# Patient Record
Sex: Male | Born: 1945 | Race: White | Hispanic: No | Marital: Married | Smoking: Never smoker
Health system: Southern US, Community
[De-identification: ages and names within clinical notes are randomized; demographics above are authoritative.]

## PROBLEM LIST (undated history)

## (undated) DIAGNOSIS — Z860100 Personal history of colon polyps, unspecified: Secondary | ICD-10-CM

## (undated) DIAGNOSIS — I10 Essential (primary) hypertension: Secondary | ICD-10-CM

## (undated) DIAGNOSIS — G250 Essential tremor: Secondary | ICD-10-CM

## (undated) DIAGNOSIS — K5730 Diverticulosis of large intestine without perforation or abscess without bleeding: Secondary | ICD-10-CM

## (undated) DIAGNOSIS — M1990 Unspecified osteoarthritis, unspecified site: Secondary | ICD-10-CM

## (undated) DIAGNOSIS — K5792 Diverticulitis of intestine, part unspecified, without perforation or abscess without bleeding: Secondary | ICD-10-CM

## (undated) DIAGNOSIS — Z8546 Personal history of malignant neoplasm of prostate: Secondary | ICD-10-CM

## (undated) DIAGNOSIS — M67262 Synovial hypertrophy, not elsewhere classified, left lower leg: Secondary | ICD-10-CM

## (undated) DIAGNOSIS — K219 Gastro-esophageal reflux disease without esophagitis: Secondary | ICD-10-CM

## (undated) DIAGNOSIS — F419 Anxiety disorder, unspecified: Secondary | ICD-10-CM

## (undated) DIAGNOSIS — E785 Hyperlipidemia, unspecified: Secondary | ICD-10-CM

## (undated) DIAGNOSIS — T4145XA Adverse effect of unspecified anesthetic, initial encounter: Secondary | ICD-10-CM

## (undated) DIAGNOSIS — R351 Nocturia: Secondary | ICD-10-CM

## (undated) DIAGNOSIS — Z87442 Personal history of urinary calculi: Secondary | ICD-10-CM

## (undated) DIAGNOSIS — N3941 Urge incontinence: Secondary | ICD-10-CM

## (undated) DIAGNOSIS — G4733 Obstructive sleep apnea (adult) (pediatric): Secondary | ICD-10-CM

## (undated) DIAGNOSIS — Z87438 Personal history of other diseases of male genital organs: Secondary | ICD-10-CM

## (undated) DIAGNOSIS — C801 Malignant (primary) neoplasm, unspecified: Secondary | ICD-10-CM

## (undated) HISTORY — PX: DIAGNOSTIC LAPAROSCOPY: SUR761

## (undated) HISTORY — PX: HERNIA REPAIR: SHX51

## (undated) HISTORY — PX: BACK SURGERY: SHX140

## (undated) HISTORY — PX: JOINT REPLACEMENT: SHX530

## (undated) HISTORY — PX: KNEE ARTHROSCOPY: SUR90

## (undated) HISTORY — DX: Essential (primary) hypertension: I10

## (undated) HISTORY — DX: Anxiety disorder, unspecified: F41.9

## (undated) HISTORY — DX: Unspecified osteoarthritis, unspecified site: M19.90

## (undated) HISTORY — DX: Malignant (primary) neoplasm, unspecified: C80.1

## (undated) HISTORY — PX: TONSILLECTOMY: SUR1361

## (undated) HISTORY — DX: Hyperlipidemia, unspecified: E78.5

---

## 1997-09-17 DIAGNOSIS — T8859XA Other complications of anesthesia, initial encounter: Secondary | ICD-10-CM

## 1997-09-17 HISTORY — DX: Other complications of anesthesia, initial encounter: T88.59XA

## 2002-03-25 ENCOUNTER — Encounter: Admission: RE | Admit: 2002-03-25 | Discharge: 2002-03-25 | Payer: Self-pay | Admitting: Neurosurgery

## 2002-03-25 ENCOUNTER — Encounter: Payer: Self-pay | Admitting: Neurosurgery

## 2002-04-07 ENCOUNTER — Encounter: Admission: RE | Admit: 2002-04-07 | Discharge: 2002-04-07 | Payer: Self-pay | Admitting: Neurosurgery

## 2002-04-07 ENCOUNTER — Encounter: Payer: Self-pay | Admitting: Neurosurgery

## 2002-04-10 ENCOUNTER — Encounter: Payer: Self-pay | Admitting: Neurosurgery

## 2002-04-13 ENCOUNTER — Encounter: Payer: Self-pay | Admitting: Physical Medicine and Rehabilitation

## 2002-04-13 ENCOUNTER — Encounter: Payer: Self-pay | Admitting: Neurosurgery

## 2002-04-13 ENCOUNTER — Inpatient Hospital Stay (HOSPITAL_COMMUNITY): Admission: RE | Admit: 2002-04-13 | Discharge: 2002-04-14 | Payer: Self-pay | Admitting: Neurosurgery

## 2002-04-13 HISTORY — PX: LUMBAR DISC SURGERY: SHX700

## 2007-04-29 ENCOUNTER — Ambulatory Visit (HOSPITAL_COMMUNITY): Admission: RE | Admit: 2007-04-29 | Discharge: 2007-04-29 | Payer: Self-pay | Admitting: Urology

## 2007-05-12 ENCOUNTER — Ambulatory Visit: Admission: RE | Admit: 2007-05-12 | Discharge: 2007-06-17 | Payer: Self-pay | Admitting: Radiation Oncology

## 2007-06-30 DIAGNOSIS — C4492 Squamous cell carcinoma of skin, unspecified: Secondary | ICD-10-CM

## 2007-06-30 HISTORY — DX: Squamous cell carcinoma of skin, unspecified: C44.92

## 2007-07-17 ENCOUNTER — Encounter (INDEPENDENT_AMBULATORY_CARE_PROVIDER_SITE_OTHER): Payer: Self-pay | Admitting: Urology

## 2007-07-17 ENCOUNTER — Inpatient Hospital Stay (HOSPITAL_COMMUNITY): Admission: RE | Admit: 2007-07-17 | Discharge: 2007-07-18 | Payer: Self-pay | Admitting: Urology

## 2007-07-17 HISTORY — PX: ROBOT ASSISTED LAPAROSCOPIC RADICAL PROSTATECTOMY: SHX5141

## 2008-03-08 ENCOUNTER — Inpatient Hospital Stay (HOSPITAL_COMMUNITY): Admission: RE | Admit: 2008-03-08 | Discharge: 2008-03-11 | Payer: Self-pay | Admitting: Orthopedic Surgery

## 2008-03-08 HISTORY — PX: TOTAL KNEE ARTHROPLASTY: SHX125

## 2009-08-10 ENCOUNTER — Ambulatory Visit (HOSPITAL_BASED_OUTPATIENT_CLINIC_OR_DEPARTMENT_OTHER): Admission: RE | Admit: 2009-08-10 | Discharge: 2009-08-10 | Payer: Self-pay | Admitting: Orthopedic Surgery

## 2010-02-24 ENCOUNTER — Encounter: Payer: Self-pay | Admitting: Family Medicine

## 2010-03-03 ENCOUNTER — Ambulatory Visit: Payer: Self-pay | Admitting: Family Medicine

## 2010-03-03 DIAGNOSIS — I10 Essential (primary) hypertension: Secondary | ICD-10-CM

## 2010-03-03 DIAGNOSIS — Z8546 Personal history of malignant neoplasm of prostate: Secondary | ICD-10-CM

## 2010-03-03 DIAGNOSIS — Z87442 Personal history of urinary calculi: Secondary | ICD-10-CM

## 2010-03-03 DIAGNOSIS — E785 Hyperlipidemia, unspecified: Secondary | ICD-10-CM

## 2010-07-13 ENCOUNTER — Ambulatory Visit: Payer: Self-pay | Admitting: Family Medicine

## 2010-07-13 LAB — CONVERTED CEMR LAB
ALT: 17 units/L (ref 0–53)
AST: 22 units/L (ref 0–37)
BUN: 14 mg/dL (ref 6–23)
Basophils Relative: 1 % (ref 0.0–3.0)
Chloride: 100 meq/L (ref 96–112)
Direct LDL: 155.1 mg/dL
Eosinophils Relative: 3.4 % (ref 0.0–5.0)
GFR calc non Af Amer: 88.21 mL/min (ref >60)
Glucose, Urine, Semiquant: NEGATIVE
HCT: 43.2 % (ref 39.0–52.0)
HDL: 35.3 mg/dL — ABNORMAL LOW (ref >39.00)
Hemoglobin: 14.9 g/dL (ref 13.0–17.0)
Lymphs Abs: 1.8 10*3/uL (ref 0.7–4.0)
MCV: 93.9 fL (ref 78.0–100.0)
Monocytes Absolute: 0.4 10*3/uL (ref 0.1–1.0)
Monocytes Relative: 6.8 % (ref 3.0–12.0)
Neutro Abs: 4 10*3/uL (ref 1.4–7.7)
Nitrite: NEGATIVE
Potassium: 4.2 meq/L (ref 3.5–5.1)
Protein, U semiquant: NEGATIVE
RBC: 4.61 M/uL (ref 4.22–5.81)
Sodium: 134 meq/L — ABNORMAL LOW (ref 135–145)
TSH: 1.98 microintl units/mL (ref 0.35–5.50)
Total Bilirubin: 0.8 mg/dL (ref 0.3–1.2)
Total CHOL/HDL Ratio: 7
Total Protein: 7.2 g/dL (ref 6.0–8.3)
VLDL: 48.6 mg/dL — ABNORMAL HIGH (ref 0.0–40.0)
WBC Urine, dipstick: NEGATIVE
WBC: 6.5 10*3/uL (ref 4.5–10.5)
pH: 5.5

## 2010-07-20 ENCOUNTER — Encounter: Payer: Self-pay | Admitting: Family Medicine

## 2010-07-20 ENCOUNTER — Ambulatory Visit: Payer: Self-pay | Admitting: Family Medicine

## 2010-07-20 DIAGNOSIS — G250 Essential tremor: Secondary | ICD-10-CM | POA: Insufficient documentation

## 2010-08-16 ENCOUNTER — Encounter: Payer: Self-pay | Admitting: Family Medicine

## 2010-08-16 ENCOUNTER — Ambulatory Visit (HOSPITAL_BASED_OUTPATIENT_CLINIC_OR_DEPARTMENT_OTHER)
Admission: RE | Admit: 2010-08-16 | Discharge: 2010-08-16 | Payer: Self-pay | Source: Home / Self Care | Admitting: Family Medicine

## 2010-08-30 ENCOUNTER — Telehealth: Payer: Self-pay | Admitting: Family Medicine

## 2010-10-10 ENCOUNTER — Other Ambulatory Visit: Payer: Self-pay | Admitting: Family Medicine

## 2010-10-10 ENCOUNTER — Ambulatory Visit
Admission: RE | Admit: 2010-10-10 | Discharge: 2010-10-10 | Payer: Self-pay | Source: Home / Self Care | Attending: Family Medicine | Admitting: Family Medicine

## 2010-10-10 LAB — LIPID PANEL
Cholesterol: 157 mg/dL (ref 0–200)
HDL: 34.4 mg/dL — ABNORMAL LOW (ref >39.00)
Total CHOL/HDL Ratio: 5
Triglycerides: 210 mg/dL — ABNORMAL HIGH (ref 0.0–149.0)
VLDL: 42 mg/dL — ABNORMAL HIGH (ref 0.0–40.0)

## 2010-10-10 LAB — HEPATIC FUNCTION PANEL
ALT: 21 U/L (ref 0–53)
AST: 24 U/L (ref 0–37)
Albumin: 4 g/dL (ref 3.5–5.2)
Alkaline Phosphatase: 74 U/L (ref 39–117)
Bilirubin, Direct: 0.1 mg/dL (ref 0.0–0.3)
Total Bilirubin: 0.5 mg/dL (ref 0.3–1.2)
Total Protein: 7.1 g/dL (ref 6.0–8.3)

## 2010-10-17 ENCOUNTER — Other Ambulatory Visit: Payer: Self-pay | Admitting: Family Medicine

## 2010-10-17 ENCOUNTER — Ambulatory Visit
Admission: RE | Admit: 2010-10-17 | Discharge: 2010-10-17 | Payer: Self-pay | Source: Home / Self Care | Attending: Family Medicine | Admitting: Family Medicine

## 2010-10-17 DIAGNOSIS — G4700 Insomnia, unspecified: Secondary | ICD-10-CM | POA: Insufficient documentation

## 2010-10-17 DIAGNOSIS — R5381 Other malaise: Secondary | ICD-10-CM

## 2010-10-17 DIAGNOSIS — R5383 Other fatigue: Secondary | ICD-10-CM | POA: Insufficient documentation

## 2010-10-17 LAB — TESTOSTERONE: Testosterone: 356.18 ng/dL (ref 350.00–890.00)

## 2010-10-17 NOTE — Assessment & Plan Note (Signed)
Summary: to be est/pt will fasting/njr pt rsc/njr   Vital Signs:  Patient profile:   65 year old male Height:      70.25 inches Weight:      194 pounds BMI:     27.74 Temp:     98.0 degrees F oral Pulse rate:   80 / minute Pulse rhythm:   regular Resp:     12 per minute BP sitting:   142 / 92  (left arm) Cuff size:   regular  Vitals Entered By: Sid Falcon LPN (March 03, 2010 10:41 AM)  History of Present Illness: New patient establish care. Past medical history reviewed. 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 He had prostate surgery robotically 2008. Has also had some knee surgery and lumbosacral disc surgery several years ago. Current medications reviewed.  On Losartan 100mg  daily for hypertension.  Family history specific for father dying aortic aneurysm 52. Mother died age 12 of pancreatitis. No siblings. Father had prostate cancer.  Patient is married. Retail banker. Nonsmoker. Occasional alcohol use  Hypertension History:      He denies headache, chest pain, palpitations, dyspnea with exertion, orthopnea, PND, peripheral edema, visual symptoms, neurologic problems, syncope, and side effects from treatment.        Positive major cardiovascular risk factors include male age 84 years old or older, hyperlipidemia, and hypertension.  Negative major cardiovascular risk factors include non-tobacco-user status.     Preventive Screening-Counseling & Management  Alcohol-Tobacco     Smoking Status: never  Allergies (verified): No Known Drug Allergies  Past History:  Family History: Last updated: 03/03/2010 father aortic aneurism age 19 grandfather, heart attack age 37 mother sudden death age 49  Pancreatitis complications.  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  Social History: Occupation:  Retail banker Married Never Smoked Alcohol use-yes Occupation:  employed Smoking Status:  never  Review of Systems  The patient denies anorexia, fever, chest pain, syncope, dyspnea on exertion, peripheral edema, prolonged cough, headaches, hemoptysis, abdominal pain, melena, hematochezia, severe indigestion/heartburn, hematuria, incontinence, and muscle weakness.         losing some weight last month due to his efforts.  Physical Exam  General:  Well-developed,well-nourished,in no acute distress; alert,appropriate and cooperative throughout examination Head:  Normocephalic and atraumatic without obvious abnormalities. No apparent alopecia or balding. Eyes:  pupils equal, pupils round, and pupils reactive to light.   Mouth:  Oral mucosa and oropharynx without lesions or exudates.  Teeth in good repair. Neck:  No deformities, masses, or tenderness noted. Lungs:  Normal respiratory effort, chest expands symmetrically. Lungs are clear to auscultation, no crackles or wheezes. Heart:  normal rate and regular rhythm.   Abdomen:  soft, non-tender, no distention, and no masses.   Extremities:  No clubbing, cyanosis, edema, or deformity noted with normal full range of motion of all joints.  Psych:  normally interactive, good eye contact, not anxious appearing, and not depressed  appearing.     Impression & Recommendations:  Problem # 1:  HYPERLIPIDEMIA (ICD-272.4) pt working on weight loss.  Consider CPE in 4 months.  Problem # 2:  ESSENTIAL HYPERTENSION (ICD-401.9)  His updated medication list for this problem includes:    Losartan Potassium 100 Mg Tabs (Losartan potassium) ..... Once daily  Problem # 3:  ADENOCARCINOMA, PROSTATE, HX OF (ICD-V10.46) followed by urology.  Problem # 4:  NEPHROLITHIASIS, HX OF (ICD-V13.01)  Complete Medication List: 1)  Sertraline Hcl 50 Mg Tabs (Sertraline hcl) .... Once daily 2)  Losartan Potassium 100 Mg Tabs (Losartan potassium) .... Once daily 3)  Aspirin 81 Mg Tabs (Aspirin) .... Once daily 4)  Vitamin D3 2000 Unit Caps (Cholecalciferol) .... Once daily 5)  Glucosamine Chondr 1500 Complx Caps (Glucosamine-chondroit-vit c-mn) .... 2 pills once daily  Hypertension Assessment/Plan:      The patient's hypertensive risk group is category B: At least one risk factor (excluding diabetes) with no target organ damage.  Today's blood pressure is 142/92.    Patient Instructions: 1)  Consider scheduling complete physical examination in 3-4 months 2)  It is important that you exercise reguarly at least 20 minutes 5 times a week. If you develop chest pain, have severe difficulty breathing, or feel very tired, stop exercising immediately and seek medical attention.  3)  You need to lose weight. Consider a lower calorie diet and regular exercise.  Prescriptions: LOSARTAN POTASSIUM 100 MG TABS (LOSARTAN POTASSIUM) once daily  #90 x 3   Entered and Authorized by:   Evelena Peat MD   Signed by:   Evelena Peat MD on 03/03/2010   Method used:   Faxed to ...       [facility] (retail)       [phone]       [place], [place]  [address]       Ph: [phone]       Fax: [phone]   RxID:   [id number] SERTRALINE HCL 50 MG TABS (SERTRALINE HCL) once daily  #90 x 3   Entered and Authorized by:   Evelena Peat MD   Signed by:   Evelena Peat MD on 03/03/2010   Method used:   Faxed to ...       [facility] (retail)       [phone] [address]       [place], [place]  [address]       Ph: [phone]       Fax: [phone]   RxID:   [id number]   Preventive Care Screening  Colonoscopy:    Date:  02/15/2005    Results:  normal   Last Tetanus Booster:    Date:  10/18/2004    Results:  Historical     Immunization History:  Pneumovax Immunization History:    Pneumovax:  historical (02/16/2007)  Zostavax History:    Zostavax # 1:  zostavax (02/16/2007)    Preventive Care Screening  Colonoscopy:    Date:  02/15/2005    Results:  normal   Last Tetanus Booster:    Date:  10/18/2004    Results:  Historical

## 2010-10-17 NOTE — Letter (Signed)
Summary: Records from [facility] 2008 - 2011  Records from [facility] 2008 - 2011   Imported By: Maryln Gottron 03/30/2010 13:13:43  _____________________________________________________________________  External Attachment:    Type:   Image     Comment:   External Document

## 2010-10-17 NOTE — Assessment & Plan Note (Signed)
Summary: cpx//ccm   Vital Signs:  Patient profile:   65 year old male Height:      70.25 inches Weight:      203 pounds Temp:     98.9 degrees F oral Pulse rate:   80 / minute Pulse rhythm:   regular Resp:     12 per minute BP sitting:   128 / 86  (left arm) Cuff size:   large  Vitals Entered By: Sid Falcon LPN (July 20, 2010 9:04 AM)  History of Present Illness: Here for CPE.  PMH, SH, AND FH  reviewed. Also here to evaluate the following separate problems:  ? OSA.  Loud snoring.  Pt wakes up gasping for air on occasion. Daytime somnolence.  Hx deviated septum.  ?observed episodes of apnea per spouse.  Some hand tremor.  Worse with fine motor manipulation.  Not progressing. No other concerning symptoms.  Rare ETOH use and does not see improvement with ETOH. No rigidity or slowing of movements.  No hx of familial tremor.  Occ sensation of skipped heart beat at rest .  Never with activity.  Very transient.  Not related to  activity.  No syncope.  No chest pain.  Stress test 2 year ago normal.  Clinical Review Panels:  Prevention   Last Colonoscopy:  normal (02/15/2005)  Immunizations   Last Tetanus Booster:  Historical (10/18/2004)   Last Pneumovax:  Historical (02/16/2007)   Last Zoster Vaccine:  Zostavax (02/16/2007)  Lipid Management   Cholesterol:  239 (07/13/2010)   HDL (good cholesterol):  35.30 (07/13/2010)  Diabetes Management   Creatinine:  0.9 (07/13/2010)   Last Pneumovax:  Historical (02/16/2007)  CBC   WBC:  6.5 (07/13/2010)   RBC:  4.61 (07/13/2010)   Hgb:  14.9 (07/13/2010)   Hct:  43.2 (07/13/2010)   Platelets:  282.0 (07/13/2010)   MCV  93.9 (07/13/2010)   MCHC  34.4 (07/13/2010)   RDW  13.6 (07/13/2010)   PMN:  60.7 (07/13/2010)   Lymphs:  28.1 (07/13/2010)   Monos:  6.8 (07/13/2010)   Eosinophils:  3.4 (07/13/2010)   Basophil:  1.0 (07/13/2010)  Complete Metabolic Panel   Glucose:  106 (07/13/2010)   Sodium:  134  (07/13/2010)   Potassium:  4.2 (07/13/2010)   Chloride:  100 (07/13/2010)   CO2:  28 (07/13/2010)   BUN:  14 (07/13/2010)   Creatinine:  0.9 (07/13/2010)   Albumin:  3.9 (07/13/2010)   Total Protein:  7.2 (07/13/2010)   Calcium:  9.0 (07/13/2010)   Total Bili:  0.8 (07/13/2010)   Alk Phos:  74 (07/13/2010)   SGPT (ALT):  17 (07/13/2010)   SGOT (AST):  22 (07/13/2010)   Allergies (verified): No Known Drug Allergies  Past History:  Past Medical History: Last updated: 03/03/2010 Arthritis Hyperlipidemia Hypertension kidney stones 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 Smoked Alcohol use-yes  Risk Factors: Exercise: no (03/03/2010)  Risk Factors: Smoking Status: never (03/03/2010) PMH-FH-SH reviewed for relevance  Review of Systems  The patient denies anorexia, fever, weight loss, weight gain, vision loss, decreased hearing, hoarseness, chest pain, syncope, dyspnea  on exertion, peripheral edema, prolonged cough, headaches, hemoptysis, abdominal pain, melena, hematochezia, severe indigestion/heartburn, hematuria, incontinence, genital sores, muscle weakness, suspicious skin lesions, transient blindness, difficulty walking, depression, unusual weight change, abnormal bleeding, enlarged lymph nodes, and testicular masses.     Physical Exam  General:  Well-developed,well-nourished,in no acute distress; alert,appropriate and cooperative throughout examination Head:  Normocephalic and atraumatic without obvious  abnormalities. No apparent alopecia or balding. Eyes:  No corneal or conjunctival inflammation noted. EOMI. Perrla. Funduscopic exam benign, without hemorrhages, exudates or papilledema. Vision grossly normal. Ears:  External ear exam shows no significant lesions or deformities.  Otoscopic examination reveals clear canals, tympanic membranes are intact bilaterally without bulging, retraction, inflammation or discharge. Hearing is grossly normal bilaterally. Mouth:  Oral mucosa and oropharynx without lesions or exudates.  Teeth in good repair. Neck:  No deformities, masses, or tenderness noted. Chest Wall:  No deformities, masses, tenderness or gynecomastia noted. Lungs:  Normal respiratory effort, chest expands symmetrically. Lungs are clear to auscultation, no crackles or wheezes. Heart:  Normal rate and regular rhythm. S1 and S2 normal without gallop, murmur, click, rub or other extra sounds. Abdomen:  Bowel sounds positive,abdomen soft and non-tender without masses, organomegaly or hernias noted. Prostate:  per urology Msk:  No deformity or scoliosis noted of thoracic or lumbar spine.   Extremities:  No clubbing, cyanosis, edema, or deformity noted with normal full range of motion of all joints.   Neurologic:  alert & oriented X3, cranial nerves II-XII intact, and strength normal in all extremities.   Skin:  no rashes and no suspicious lesions.   Cervical Nodes:  No lymphadenopathy noted Psych:  Cognition and judgment appear intact. Alert and cooperative with normal attention span and concentration. No apparent delusions, illusions, hallucinations   Impression & Recommendations:  Problem # 1:  ROUTINE GENERAL MEDICAL EXAM@HEALTH  CARE FACL (ICD-V70.0)  Orders: EKG w/ Interpretation (93000) Sleep Disorder Referral (Sleep Disorder)  Problem # 2:  PALPITATIONS (ICD-785.1) EKG normal.  ?PVCs .  Consider holter if progresses.  Problem # 3:  TREMOR, ESSENTIAL (ICD-333.1) Supsect essential  tremor.  Discussed options.  At this point, not interested in medical rx options unless progresses.  Problem # 4:  HYPERLIPIDEMIA (ICD-272.4) discussed pros and cons of therapy.  We have decided to start statin wtih f/u repeat 6-8 weeks. His updated medication list for this problem includes:    Simvastatin 20 Mg Tabs (Simvastatin) ..... One by mouth at bedtime  Problem # 5:  APNEA (ICD-786.03) ?OSA with hx snoring, waking with gasping for air, daytime somnolence. Set up sleep study.  Complete Medication List: 1)  Sertraline Hcl 50 Mg Tabs (Sertraline hcl) .... Once daily 2)  Losartan Potassium 100 Mg Tabs (Losartan potassium) .... Once daily 3)  Aspirin 81 Mg Tabs (Aspirin) .... Once daily 4)  Vitamin D3 2000 Unit Caps (Cholecalciferol) .... Once daily 5)  Glucosamine Chondr 1500 Complx Caps (Glucosamine-chondroit-vit c-mn) .... 2 pills once daily 6)  Simvastatin 20 Mg Tabs (Simvastatin) .... One by mouth at bedtime  Patient Instructions: 1)  Please schedule a follow-up appointment in 2 months.  2)  Hepatic Panel prior to visit ICD-9: 272.4 3)  Lipid panel prior to visit ICD-9 : 272.4 Prescriptions: SIMVASTATIN 20 MG TABS (SIMVASTATIN) one by mouth at bedtime  #30 x 5   Entered and Authorized by:   Evelena Peat MD   Signed by:   Evelena Peat MD on 07/20/2010   Method used:   Faxed to [facility] (retail)       [phone] [address]       [place], [place]  [address]       Ph: [phone]       Fax: [phone]   RxID:   [id number]    Orders Added: 1)  Est. Patient 40-64 years [99396] 2)  EKG w/ Interpretation [93000] 3)  Sleep Disorder Referral [Sleep Disorder] 4)  Est. Patient Level IV [27253]

## 2010-10-19 NOTE — Progress Notes (Signed)
  Phone Note Call from Patient Call back at [facility] Phone [phone] Call back at Work Phone [phone]   Caller: Patient Call For: Evelena Peat MD Summary of Call: Needs sleeep study reports. Initial call taken by: [facility] CMA AAMA,  August 30, 2010 3:49 PM  Follow-up for Phone Call        Pt notified of sleep results.  Mild apnea with increased periodic limb movements.  He is undecided regarding whether to do dedicted CPAP titration study and does not wish to take any additional meds at this time.  He has only mild daytime sleepiness.  Will discuss further at visit in January,. Follow-up by: Evelena Peat MD,  August 30, 2010 6:18 PM

## 2010-10-25 NOTE — Assessment & Plan Note (Signed)
Summary: 2 MONTH ROV/NJR   Vital Signs:  Patient profile:   65 year old male Weight:      205 pounds Temp:     98.3 degrees F oral BP sitting:   140 / 90  (left arm) Cuff size:   large  Vitals Entered By: Sid Falcon LPN (October 17, 2010 8:49 AM) CC: 2 month follow-up   History of Present Illness: Here for follow up :  Hyperlipidemia.   started treatment with simvastatin and he has had no side effects. Recent lipids reviewed with patient and good improvement.    hypertension treated with losartan. Blood pressure stable. recent mild weight gain.  History of some chronic fatigue. Patient clearly is hypo-testosterone is him. Never checked. chronic poor sleep. Recent sleep study reviewed with patient. Mild obstructive sleep apnea. Increase in periodic leg movements. Question restless leg syndrome. Patient has history of frequent wakening.  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 Smoked Alcohol use-yes  Risk Factors: Exercise: no (03/03/2010)  Risk Factors: Smoking Status: never (03/03/2010) PMH-FH-SH reviewed for relevance  Review of Systems  The patient denies anorexia, fever, weight loss, vision loss, decreased hearing, hoarseness, chest pain, syncope, dyspnea on exertion, peripheral edema, prolonged cough, headaches, hemoptysis, abdominal  pain, melena, hematochezia, severe indigestion/heartburn, hematuria, incontinence, muscle weakness, suspicious skin lesions, transient blindness, difficulty walking, depression, unusual weight change, enlarged lymph nodes, and testicular masses.    Physical Exam  General:  Well-developed,well-nourished,in no acute distress; alert,appropriate and cooperative throughout examination Mouth:  Oral mucosa and oropharynx without lesions or exudates.  Teeth in good repair. Lungs:  Normal respiratory effort, chest expands symmetrically. Lungs are clear to auscultation, no crackles or wheezes. Heart:  Normal rate and regular rhythm. S1 and S2 normal without gallop, murmur, click, rub or other extra sounds. Extremities:  No clubbing, cyanosis, edema, or deformity noted with normal full range of motion of all joints.     Impression & Recommendations:  Problem # 1:  FATIGUE (ICD-780.79)  likely multifactorial. Only mild obstructive apnea. Rule out low testosterone. Recent TSH normal. Recommend weight loss and more regular exercise. possibly related to poor sleep quality Orders: Venipuncture (04540) Specimen Handling (98119) TLB-Testosterone, Total (84403-TESTO)  Problem # 2:  HYPERLIPIDEMIA (ICD-272.4) Assessment: Improved  His updated medication list for this problem includes:    Simvastatin 20 Mg Tabs (Simvastatin) ..... One by mouth at bedtime  Problem # 3:  INSOMNIA, CHRONIC (ICD-307.42)  trial of low-dose clonazepam 0.5 mg each bedtime which may help with sleep as well as periodic leg movements. Restriction of caffeine at night and afternoons  Complete Medication List: 1)  Sertraline Hcl 50 Mg Tabs (Sertraline hcl) .... Once daily 2)  Losartan Potassium 100 Mg Tabs (Losartan potassium) .... Once daily 3)  Aspirin 81 Mg Tabs (Aspirin) .... Once daily 4)  Vitamin D3 2000 Unit Caps (Cholecalciferol) .... Once daily 5)  Simvastatin 20 Mg Tabs (Simvastatin) .... One by mouth at bedtime 6)   Clonazepam 0.5 Mg Tabs (  Clonazepam) .... One by mouth q hs  Patient Instructions: 1)  Please schedule a follow-up appointment in 3 months .  Prescriptions: CLONAZEPAM 0.5 MG TABS (CLONAZEPAM) one by mouth q hs  #30 x 3   Entered and Authorized by:   Evelena Peat MD   Signed by:   Evelena Peat MD on 10/17/2010   Method used:   Print then Give to Patient   RxID:   [id number]    Orders Added: 1)  Venipuncture [56213] 2)  Specimen Handling [99000] 3)  TLB-Testosterone, Total [84403-TESTO] 4)  Est. Patient Level IV [08657]

## 2010-12-20 LAB — POCT I-STAT 4, (NA,K, GLUC, HGB,HCT)
Hemoglobin: 15 g/dL (ref 13.0–17.0)
Potassium: 4.3 mEq/L (ref 3.5–5.1)

## 2011-01-16 ENCOUNTER — Ambulatory Visit: Payer: Self-pay | Admitting: Family Medicine

## 2011-01-17 ENCOUNTER — Other Ambulatory Visit: Payer: Self-pay | Admitting: Family Medicine

## 2011-01-30 NOTE — H&P (Signed)
Micheal [name], Micheal [name]              ACCOUNT NO.:  [id number]   MEDICAL RECORD NO.:  [id number]          PATIENT TYPE:  INP   LOCATION:  0011                         FACILITY:  [facility]   PHYSICIAN:  Ollen Gross, M.D.    DATE OF BIRTH:  [birth year]   DATE OF ADMISSION:  03/08/2008  DATE OF DISCHARGE:                              HISTORY & PHYSICAL   CHIEF COMPLAINT:  Left knee pain.   HISTORY OF PRESENT ILLNESS:  The patient is a 65 year old male who has  been seen by Dr. Lequita Halt for ongoing left knee pain.  He has known end-  stage arthritis.  He has been treated conservatively in the past but has  worsening pain and dysfunction.  He is at a point now where he would  like to have something permanently done.  He has been treated with  cortisone injection in the past which only provided temporary relief.  He is felt to be a good candidate.  The risks and benefits discussed.  The patient is subsequently admitted to the hospital.  He has been seen  preoperatively by Pam Drown, M.D. and felt to be stable for  surgery.   ALLERGIES:  NO KNOWN DRUG ALLERGIES.   CURRENT MEDICATIONS:  1. Lipitor.  2. Benicar.  3. Sertraline.  4. Alprazolam.  5. Aspirin.   PAST MEDICAL HISTORY:  1. Anxiety.  2. Hypertension.  3. History of prostate cancer.  4. Renal calculi.  5. Degenerative disk disease.   PAST SURGICAL HISTORY:  Disk surgery in 2003, prostate surgery November  2007.   SOCIAL HISTORY:  Married.  Business owner.  Nonsmoker.  About 2 to 3  glasses wine per week.  Two children.  Family will be assisting with  care after surgery.  Has about 2 to 3 steps entering his home.   FAMILY HISTORY:  Father deceased age 58 with heart failure.  Mother  deceased age 73 with pancreatic disease.   REVIEW OF SYSTEMS:  GENERAL:  No fevers, chills, night sweats.  NEURO:  No seizures, syncope or paralysis.  RESPIRATORY:  No shortness breath at rest, productive cough or  hemoptysis.  CARDIOVASCULAR:  No chest pain, no orthopnea.  GI:  No nausea, vomiting, diarrhea or constipation.  GU:  No dysuria, hematuria or discharge.  MUSCULOSKELETAL: Left knee pain.   PHYSICAL EXAMINATION:  VITAL SIGNS: Pulse 68, respirations 12, blood  pressure 124/86.  GENERAL: 65 year old white male well-nourished, well-developed, no acute  distress.  He is alert, oriented and cooperative.  HEENT: Normocephalic,  atraumatic.  Pupils are round and reactive.  Oropharynx clear.  EOMs  intact.  NECK:  Supple.  CHEST: Clear.  HEART: Regular rate and rhythm.  No murmur.  ABDOMEN:  Soft, slightly round.  Bowel sounds.  RECTAL/GENITALIA: Not done, not pertinent to present illness.  EXTREMITIES:  Left knee no effusion.  Range of motion 5-120, marked  crepitus, tender more medial than lateral, slight varus malalignment  deformity.   IMPRESSION:  Osteoarthritis left knee.   PLAN:  The patient admitted tok [facility] to  undergo a left total knee  replacement arthroplasty.  Surgery will be  performed by Dr. Ollen Gross.      Alexzandrew L. Perkins, P.A.C.      Ollen Gross, M.D.  Electronically Signed    ALP/MEDQ  D:  03/08/2008  T:  03/08/2008  Job:  [id number]   cc:   Ollen Gross, M.D.  Fax: [phone]   Pam Drown, M.D.  Fax: [phone]

## 2011-01-30 NOTE — Op Note (Signed)
Micheal [name], Micheal [name]              ACCOUNT NO.:  [id number]   MEDICAL RECORD NO.:  [id number]          PATIENT TYPE:  INP   LOCATION:  1444                         FACILITY:  [facility]   PHYSICIAN:  Heloise Purpura, MD      DATE OF BIRTH:  [birth year]   DATE OF PROCEDURE:  07/17/2007  DATE OF DISCHARGE:                               OPERATIVE REPORT   PREOPERATIVE DIAGNOSIS:  Clinically localized adenocarcinoma of  prostate.   POSTOPERATIVE DIAGNOSIS:  Clinically localized adenocarcinoma of  prostate.   PROCEDURE:  Robotic assisted laparoscopic radical prostatectomy  (bilateral nerve sparing).   SURGEON:  Heloise Purpura, M.D.   ASSISTANT:  Excell Seltzer. Annabell Howells, M.D.   ANESTHESIA:  General.   COMPLICATIONS:  None.   ESTIMATED BLOOD LOSS:  100 mL.   SPECIMENS:  Prostate and seminal vesicles.   DISPOSITION OF SPECIMENS:  To pathology.   DRAINS:  1. 20 French straight catheter.  2. #19 Blake pelvic drain.   INDICATIONS:  Mr. Klahn is a 65 year old gentleman with recently  diagnosed clinically localized prostate cancer.  After a discussion  regarding management options, the patient elected to proceed with the  above procedure.  The potential risks, complications, and alternative  options were discussed in detail and informed consent was obtained.   DESCRIPTION OF PROCEDURE:  The patient was taken to the operating room  and a general anesthetic was administered.  He was given preoperative  antibiotics, placed in the dorsal lithotomy position, prepped and draped  in the usual sterile fashion.  Next, a preoperative time out was  performed.  A Foley catheter was inserted into the bladder.  A site was  selected just to the left of the umbilicus and entry into the peritoneal  cavity was obtained using a standard open Hassan technique.  This  allowed entry under direct vision without difficulty and a 12 mm port  was then placed.  A pneumoperitoneum was established and the 0 degrees  lens  was used to inspect the abdomen.  There was no evidence of any  intra-abdominal injuries or other abnormalities.  Bilateral 8 mm robotic  ports were then placed on either side of the camera port and just below  the level of the umbilicus.  An additional 8 mm robotic port was placed  in the far left lateral abdominal wall.  A 5 mm port was placed between  the camera port and the right robotic port.  An additional 12 mm port  was placed in the far right lateral abdominal wall for laparoscopic  assistance.  All ports were placed under direct vision without  difficulty.   The surgical cart was then docked.  With the aid of cautery scissors,  the bladder was reflected posteriorly allowing entry into the space of  Retzius and identification of the endopelvic fascia and prostate.  The  endopelvic fascia was incised from the apex back to the base of the  prostate bilaterally and the underlying levator muscle fibers were swept  laterally off the prostate, thereby isolating the dorsal venous complex.  The dorsal venous complex was then stapled and  divided with a 45 mm flex  ETS stapler.  The bladder neck was then identified with the aid of Foley  catheter manipulation and the bladder neck was divided anteriorly,  thereby, exposing the Foley catheter.  The catheter balloon was deflated  and the catheter was brought into the operative field and used to  retract the prostate anteriorly.  This helped to expose the posterior  bladder neck which was then divided and dissection continued posteriorly  between the bladder and prostate until the vasa differentia and seminal  vesicles were identified.  The vasa differentia were isolated, divided  and lifted anteriorly.  The seminal vesicles were then dissected down to  their tips with care to control the seminal vesicle arterial blood  supply.  The seminal vesicles were then lifted anteriorly and the space  between Denonvilliers' fascia and the anterior  rectum was bluntly  developed.  This isolated the vascular pedicles of the prostate.  The  lateral prostatic fascia was then incised bilaterally allowing the  neurovascular bundles be swept laterally and posteriorly off the  prostate.  There was noted to be some adherence between the lateral  prostatic fascia and the prostate and, therefore, the majority of the  nerve sparing procedure was performed during pedicle ligation.  The  vascular pedicles of the prostate were then ligated with Hem-A-Lock  clips and sharply divided and the neurovascular bundles were swept off  the prostate out to the apex bilaterally.  The urethra was then sharply  divided allowing the prostate specimen to be disarticulated.  The pelvis  was copiously irrigated and hemostasis was ensured.  There was noted to  be small amount of bleeding toward the left neurovascular bundles.  A  small piece of Surgicel was placed in this vicinity.  With irrigation in  the pelvis, air was injected into the rectal catheter.  There was no  evidence of a rectal injury.   Attention was then turned to the urethral anastomosis.  A 2-0 Vicryl  suture was placed between Denonvilliers' fascia, the posterior bladder  neck, and the posterior urethral tissue to reapproximate these  structures.  A double armed 3-0 Monocryl suture was then used to perform  a 360 degrees running tension free anastomosis between the bladder neck  and urethra.  A new 20-French Coude catheter was inserted into the  bladder and irrigated.  There was no evidence of any blood clots within  the bladder and the anastomosis appeared to be watertight.  A #19 Blake  drain was then brought through the left robotic port and appropriately  positioned within the pelvis.  It was secured to the skin with a nylon  suture.  The right lateral 12 mm port site was then closed with a 0  Vicryl suture placed with the aid of the suture passer device.  The  prostate specimen was removed  intact within the Endopouch retrieval bag  via the periumbilical port site.  This fascial opening was then closed  with a running 0 Vicryl suture.  All port sites were injected with 0.25%  Marcaine and reapproximated at the skin level with staples.  Sterile  dressings were applied.  The patient appeared to tolerate the procedure  well without complications.  He was able to be extubated and transferred  to the recovery unit in satisfactory condition.      Heloise Purpura, MD  Electronically Signed     LB/MEDQ  D:  07/17/2007  T:  07/17/2007  Job:  [id number]

## 2011-01-30 NOTE — Discharge Summary (Signed)
Micheal [name], Micheal [name]              ACCOUNT NO.:  [id number]   MEDICAL RECORD NO.:  [id number]          PATIENT TYPE:  INP   LOCATION:  1613                         FACILITY:  [facility]   PHYSICIAN:  Ollen Gross, M.D.    DATE OF BIRTH:  [birth year]   DATE OF ADMISSION:  03/08/2008  DATE OF DISCHARGE:                               DISCHARGE SUMMARY   ADMITTING DIAGNOSES:  1. Osteoarthritis left knee.  2. Anxiety.  3. Hypertension.  4. History of prostate cancer.  5. Renal calculi.  6. Degenerative disk disease.   DISCHARGE DIAGNOSES:  1. Osteoarthritis left knee, status post left total knee replacement      arthroplasty.  2. Mild postop hyponatremia improved.  3. Anxiety.  4. History of prostate cancer.  5. Renal calculi.  6. Degenerative disk disease.   PROCEDURE:  March 08, 2008, left total knee.  Surgeon Dr. Lequita Halt,  assistant Avel Peace PA-C.  Anesthesia was attempted spinal then  conversion to general.   CONSULTS:  None.   BRIEF HISTORY:  Micheal [name] is a 64 year old male with significant end-  stage arthritis of the left knee, progressive worsening pain and  dysfunction, failed nonoperative management, now presents for total knee  arthroplasty.   LABORATORY DATA:  Preop CBC showed hemoglobin of 14.2, hematocrit 41.4,  white cell count 6.7, platelets 267.  Chem panel on admission all within  normal limits with the exception of minimally elevated glucose of 123.  PT/INR 12.5 and 0.9 with PTT of 23.  Preop UA negative.  Serial CBCs  were followed.  Hemoglobin did drop down to 11.2, last H&H 10.3 and  30.1.  Serial protimes followed.  PT/INR 16.5 and 1.3.  Serial B mets  were followed.  Sodium did drop down to 135 then got as low as 132 back  up to 137.  Remaining electrolytes, potassium did have a bump up to 5.2,  came back down to 4.7, last noted potassium 3.7.   EKG, chest x-ray July 11, 2007 no active cardiopulmonary disease.   EKG Feb 05, 2008 normal sinus  rhythm, rate 71, no change from March 16, 2007 confirmed by Pam Drown, M.D.   HOSPITAL COURSE:  The patient admitted to [facility].  Tolerated procedure well, later transferred to recovery room, to the  orthopedic floor started on PCA and p.o. analgesic pain control  following surgery.  Given 24 hours postop IV antibiotics.  Started on  Coumadin for DVT prophylaxis.  Did have a rough night with pain, did a  little bit better on the morning of day one, did have was Duramorph in  the spinal that was attempted and his blood pressure ran a little low  throughout the night and into the next day.  We did give him fluids.  His output was low probably due to the low pressures so we gave him  fluid challenges, kept his pressure up and his initial postop low output  did improve and a little bit of slight bump in his creatinine up to 1.44  but that was due to some of the mild  insufficiency with the hypotension  postop.  That did resolve with the fluid boluses.  His creatinine was  back down the very next morning, back down to 1.3.  We held his blood  pressure meds due to the hypotension but again that corrected itself.  He had a little bit of hyperkalemia potassium of 5.2 but we felt that  was due to the potassium in the fluids.  We changed the fluids on the  morning of day 1 and that corrected itself also.  He was assisted a few  steps on day 1.  By day 2 he was doing a little bit better.  Electrolytes, potassium  was back down but his sodium had dropped which  was felt to be due to all the fluids we gave him.  We DC'd his fluids  since he was taking p.o.'s well.  Dressing change, incision looked good.  Started getting up, doing a little bit more therapy walking about 25  feet.  He was seen on the morning of day 3 doing well.  Electrolytes  were fine.  Hemoglobin was at 10.3.  Incision looked good.  He just  needed do a little bit more therapy.  If he met all of his goals we   would send him home later today. If not we may have to hold him for  another day but as long as he did well, he would be discharged on March 11, 2008.   DISCHARGE/PLAN:  1. Tentative plan for discharge on today, March 11, 2008.  2. Discharge diagnoses, please see above.  3. Discharge meds: Darvocet, Robaxin, Coumadin.   FOLLOW-UP:  2 weeks.   ACTIVITY:  Weightbearing as tolerated left leg, home health PT, home  health nursing, total knee protocol.   DISPOSITION:  Home.   CONDITION ON DISCHARGE:  Home if improved with therapy.      Alexzandrew L. Perkins, P.A.C.      Ollen Gross, M.D.  Electronically Signed    ALP/MEDQ  D:  03/11/2008  T:  03/11/2008  Job:  [id number]   cc:   Pam Drown, M.D.  Fax: [phone]

## 2011-01-30 NOTE — Op Note (Signed)
Micheal [name]              ACCOUNT NO.:  [id number]   MEDICAL RECORD NO.:  [id number]          PATIENT TYPE:  INP   LOCATION:  0011                         FACILITY:  [facility]   PHYSICIAN:  Ollen Gross, M.D.    DATE OF BIRTH:  Aug 01, 1946   DATE OF PROCEDURE:  03/08/2008  DATE OF DISCHARGE:                               OPERATIVE REPORT   PREOPERATIVE DIAGNOSIS:  Osteoarthritis, left knee.   POSTOPERATIVE DIAGNOSIS:  Osteoarthritis, left knee.   PROCEDURE:  Left total knee arthroplasty.   SURGEON:  Ollen Gross, M.D.   ASSISTANT:  Alexzandrew L. Perkins, P.A.-C.   ANESTHESIA:  Attempted spinal, then conversion to general.   ESTIMATED BLOOD LOSS:  Minimal.   DRAIN:  None.   TOURNIQUET TIME:  37 minutes at 300 mmHg.   COMPLICATIONS:  None.   CONDITION:  Stable to recovery.   CLINICAL NOTE:  Micheal [name] is a 65 year old male with significant end-  stage arthritis of the left knee, progressively worsening pain and  dysfunction.  He has failed nonoperative management including injection  and presents for total knee arthroplasty.   PROCEDURE IN DETAIL:  After attempted administration of a spinal  anesthetic, a tourniquet is placed high on his left thigh and left lower  extremity prepped and draped in the usual sterile fashion.  The  extremity is wrapped in an Esmarch, the knee flexed, the tourniquet  inflated to 300 mmHg.  We tested the spinal.  It appeared to be working,  then made the incision and the patient, unfortunately, did feel the pain  from the incision.  He was subsequently converted to a general  anesthetic.  Once that was complete, then we proceeded with the  dissection.  The subcutaneous tissue was dissected to the extensor  mechanism.  A fresh blade is used to make a medial parapatellar  arthrotomy.  Soft tissue over the proximal medial tibia is  subperiosteally elevated to the joint line with the knife and into the  semimembranosus bursa with a Cobb  elevator.  Soft tissue laterally is  elevated with attention being paid to avoiding the patellar tendon on  the tibial tubercle.  The patella is subluxed laterally, the knee flexed  to 90 degrees, ACL and PCL removed.  A drill is used to create a  starting hole in the distal femur and the canal is thoroughly irrigated.  The 5-degree left valgus alignment guide is placed and referencing off  the posterior condyles, rotation is marked and the block pinned to  remove 11 mm off the distal femur.  I took 11 because of preop flexion  contracture.  Distal femoral resection is made with an oscillating saw.  Sizing block is placed and a size 4 is the most appropriate.  Rotation  is marked at the epicondylar axis.  A size 4 cutting block is placed and  the anterior-posterior and chamfer cuts made.   The tibia is subluxed forward and menisci are removed.  The  extramedullary tibial alignment guide is placed referencing proximally  at the medial aspect of the tibial tubercle and distally along the  second metatarsal axis and tibial crest.  The block is pinned to remove  about 2 mm off the more deficient medial side.  Tibial resection is made  with an oscillating saw.  A fair amount of defect on the medial side.  We cut down to normal bone.  A size 4 is the most appropriate tibial  component and the proximal tibia is prepared with the modular drill and  keel punch for a size 4.  Femoral preparation is completed with the  intercondylar cut.   A size 4 mobile bearing tibial trial, a size 4 posterior-stabilized  femoral trial and a 12.5-mm posterior-stabilized rotating platform  insert trial are placed.  With the 12.5 there is a tiny bit of varus-  valgus play in full extension so we went to a 15, which allowed for full  extension with excellent varus-valgus, anterior and posterior balance  throughout full range of motion.  The patella was then everted and  thickness measured to be 26 mm.  Freehand  resection is taken to 14 mm, a  41 template is placed, lug holes are drilled, trial patella is placed  and it tracks normally.  Osteophytes are removed on the posterior femur  with the trial in place.  All trials are removed and the cut bone  surfaces are prepared with pulsatile lavage.  Cement is mixed and once  ready for implantation, the size 4 mobile bearing tibial tray, size 4  posterior-stabilized femur and 41 patella are cemented into place and  the patella is held with a clamp.  Trial 15-mm insert is placed, knee  held in full extension and all extruded cement removed.  When the cement  is fully hardened, then the permanent 15-mm posterior-stabilized  rotating platform insert is placed into the tibial tray.  The wound is  copiously irrigated with saline solution and the FloSeal then injected  on the posterior capsule and medial and lateral gutters and  suprapatellar area.  A moist sponge is placed and tourniquet released  for a total time of 37 minutes.  The sponge is held 2 minutes and then  removed.  Minimal bleeding is encountered and that which is encountered  is stopped with electrocautery.  The wound is again further irrigated  and the arthrotomy closed with a running ##2 quill suture.  Flexion  against gravity is 140 degrees.  Subcu is closed with interrupted 2-0  Vicryl and subcuticular running 4-0 Monocryl.  The incision is cleaned  and dried and Steri-Strips and a bulky sterile dressing applied.  He is  then awakened and transported to recovery in stable condition.      Ollen Gross, M.D.  Electronically Signed     FA/MEDQ  D:  03/08/2008  T:  03/08/2008  Job:  [id number]

## 2011-01-30 NOTE — H&P (Signed)
Micheal [name], Micheal [name]              ACCOUNT NO.:  [id number]   MEDICAL RECORD NO.:  [id number]          PATIENT TYPE:  INP   LOCATION:  1444                         FACILITY:  [facility]   PHYSICIAN:  Heloise Purpura, MD      DATE OF BIRTH:  07/27/1946   DATE OF ADMISSION:  07/17/2007  DATE OF DISCHARGE:                              HISTORY & PHYSICAL   CHIEF COMPLAINT:  Prostate cancer.   HISTORY:  Mr. Micheal [name] is a 65 year old gentleman with clinical stage T1c  prostate cancer with a PSA of 6.35 and Gleason score 3+3 equals 6.  After discussing management options for clinically localized prostate  cancer, he elected to proceed with surgical therapy and a robotic  prostatectomy.   PAST MEDICAL HISTORY:  1. Anxiety.  2. Arthritis.  3. Hyperlipidemia.  4. Hypertension.  5. Nephrolithiasis.   PAST SURGICAL HISTORY:  1. Arthroscopic knee surgery.  2. Back surgery.   MEDICATIONS:  1. Alprazolam.  2. Aspirin.  3. Benicar.  4. Lipitor.  5. Sertraline   ALLERGIES:  No known drug allergies.   FAMILY HISTORY:  The patient's father does have prostate cancer abdomen  was diagnosed at age 70.  He currently is receiving androgen deprivation  and is alive at age 34.  There also is a family history of  nephrolithiasis.   SOCIAL HISTORY:  The patient is married and has two daughters.  He works  as a Medical illustrator.  He denies alcohol or tobacco use.   REVIEW OF SYSTEMS:  Pertinent positives include a history of  constipation.  All other systems are reviewed and are negative.   PHYSICAL EXAMINATION:  CONSTITUTIONAL:  Well-nourished, well-developed,  age-appropriate male in no acute distress.  CARDIOVASCULAR:  Regular rate and rhythm without obvious murmurs.  LUNGS:  Clear bilaterally.  ABDOMEN:  Soft, nontender, nondistended without abdominal masses.  DIGITAL RECTAL EXAM:  No prostate nodularity or induration.   IMPRESSION:  Clinically localized adenocarcinoma of the prostate.   PLAN:  Mr.  Micheal [name] will undergo a robotic-assisted laparoscopic radical  prostatectomy and then be admitted to the hospital for routine  postoperative care.      Heloise Purpura, MD  Electronically Signed     LB/MEDQ  D:  07/17/2007  T:  07/17/2007  Job:  [id number]

## 2011-02-02 NOTE — H&P (Signed)
NAME:  Micheal [name], Micheal [name]                        ACCOUNT NO.:  [id number]   MEDICAL RECORD NO.:  [id number]                   PATIENT TYPE:  INP   LOCATION:  3010                                 FACILITY:  [facility]   PHYSICIAN:  Tanya Nones. Jeral Fruit, MD               DATE OF BIRTH:  Jan 31, 1946   DATE OF ADMISSION:  04/13/2002  DATE OF DISCHARGE:  04/14/2002                                HISTORY & PHYSICAL   HISTORY OF PRESENT ILLNESS:  [name] is a gentleman who had been seen in  my office on several occasions because of back and left leg pain.  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  Nevertheless, he has episode __________  including epidural injection without any improvement.  Now he decides he  wants to go to surgery because the pain is unbearable.  He denies any  problem with his right leg.   PAST MEDICAL HISTORY:  Knee surgery x2 in the left leg.  He has some surgery  and also a broken arm.   MEDICATIONS:  He has taken some hydrochlorothiazide for blood pressure,  Zoloft, and Xanax.   SOCIAL HISTORY:  The patient denies smoking or drinking.   FAMILY HISTORY:  Mother died at age 74 with heart attack, type 2 diabetes.  Father is 13 and in good condition.   PHYSICAL EXAMINATION:  GENERAL:  The patient came to the office with his  wife.  He was limping on the left leg.  He had difficulty sitting.  Standing  was really uncomfortable.  HEENT:  Head is anterior normal.  NECK:  Normal.  LUNGS:  Clear.  HEART:  Sounds normal.  EXTREMITIES:  Lower extremities normal pulses.  MINI MENTAL STATUS:  Normal.  NEUROLOGIC:  Strength is 5/5 except in the left leg where I can quite easily  the left iliopsoas and also the equinus.  Pulses are symmetrical with  decrease of the left knee jerk.  Sensation he complained of numbness which  involved  mostly the anterior equinus.  Straight leg raising was negative  bilaterally at 90 degree.  The __________ was highly positive on the left  side.   LABORATORIES:  The MRI showed multiple level degenerative disk disease.  At  the level 3-4 there is an intraforaminal herniated disk affecting the L3  nerve root.   CLINICAL IMPRESSION:  1. Left L3 radiculopathy secondary to intraforaminal disk at the L3-4.  2. Degenerative disk disease on multiple levels.    RECOMMENDATIONS:  The patient wants to proceed with surgery because he is no  better.  The procedure will be an L3-L4 diskectomy, intra and probably  extraforaminal.  Surgical risks were explained to the patient such as CSF  leak, no improvement whatsoever, need  for further surgery, infection, damage  to the vessel of the abdomen, damage to the nerve.  The patient declined  more opinion.                                                Tanya Nones. Jeral Fruit, MD    EMB/MEDQ  D:  04/13/2002  T:  04/15/2002  Job:  [id number]

## 2011-02-02 NOTE — Op Note (Signed)
NAME:  Micheal [name], Micheal [name]                        ACCOUNT NO.:  [id number]   MEDICAL RECORD NO.:  [id number]                   PATIENT TYPE:  INP   LOCATION:  3010                                 FACILITY:  [facility]   PHYSICIAN:  Tanya Nones. Jeral Fruit, MD               DATE OF BIRTH:  05-Aug-1946   DATE OF PROCEDURE:  04/13/2002  DATE OF DISCHARGE:  04/14/2002                                 OPERATIVE REPORT   PREOPERATIVE DIAGNOSES:  1. Left L3-4 herniated disk with an intraforaminal and extraforaminal     component  2. Multiple-level spondylosis.   POSTOPERATIVE DIAGNOSES:  1. Left L3-4 herniated disk with an intraforaminal and extraforaminal     component.  2. Multiple-level spondylosis.   PROCEDURE:  1. Left L3-4 intraforaminal and extraforaminal diskectomy, removal of large     free fragment extraforaminal.  2. Left L4-5 foraminotomy.   SURGEON:  Tanya Nones. Jeral Fruit, MD.   ASSISTANT:  Stefani Dama, M.D.   CLINICAL HISTORY:  Mr. Lapinsky is a 65 year old gentleman complaining of  back pain with radiation down to the left leg associated with weakness of  the iliopsoas.  The patient had conservative treatment without improvement.  The MRI showed that he has a herniated disk at the level of 3-4 and 4-5 with  extraforaminal component.  He has also multiple-level spondylosis.  Clinically he has a weakening of the quadriceps.  Prior to surgery he was  telling me that he was having some tingling sensation and pain going to the  left foot.  Surgery was advised, and the risks were explained in the history  and physical.   DESCRIPTION OF PROCEDURE:  The patient was taken to the OR and after  intubation, he was positioned in a prone manner.  The back was prepped with  Betadine.  A midline incision was made.  X-ray showed that indeed we were at  the level of L4 spinous process.  From then on we identified the 3-4 space.  With the drill we drilled the lower lamina of L3 and the upper of L4.   One-  third of the medial facet was removed.  We brought the microscope into the  area, and the yellow ligament was also excised.  We identified the thecal  sac, and we retracted it medially.  Indeed, there was a herniated disk with  mostly extraforaminal component.  Nevertheless, we did an incision into the  foramina and large amounts of degenerative disk were removed.  Then we went  laterally and we found the facet of 3-4.  This was drilled, and the  transverse ligament was excised.  We found the L3 nerve root, which was  swollen, completely glued to the area.  Lysis was done, retraction finally  was accomplished, and we pulled a large disk which was compromising the  takeoff of L3 down to the ganglion.  Having done this, we did an  incision  and total diskectomy, going from the extraforaminal to the intraforaminal  space was accomplished.  Large amount of herniated disk was removed.  At the  end we had plenty of decompression of the L3 root and total diskectomy.  Because of the tingling he was having in the left foot and some pain, we  went ahead and we did a laminotomy of 4-5  and we did a foraminotomy to decompress the L4 and L5 nerve roots.  At the  end we had good decompression.  Valsalva maneuver was negative.  The area  was irrigated and fentanyl and Depo-Medrol were left intraforaminally and  extraforaminally.  The wound was closed with Vicryl and a Steri-Strip.                                                   Tanya Nones. Jeral Fruit, MD    EMB/MEDQ  D:  04/13/2002  T:  04/17/2002  Job:  [id number]

## 2011-02-02 NOTE — Discharge Summary (Signed)
Micheal [name], Micheal [name]              ACCOUNT NO.:  [id number]   MEDICAL RECORD NO.:  [id number]          PATIENT TYPE:  INP   LOCATION:  1444                         FACILITY:  [facility]   PHYSICIAN:  Heloise Purpura, MD      DATE OF BIRTH:  [birth year]   DATE OF ADMISSION:  07/17/2007  DATE OF DISCHARGE:  07/18/2007                               DISCHARGE SUMMARY   Admission Diagnosis: Prostate Cancer  Discharge Diagnosis: Prostate Cancer   HISTORY AND PHYSICAL:  For full details, please see admission History  and Physical.  Briefly, Micheal [name] is a 65 year old gentleman with  clinically localized prostate cancer.  After discussing management  options for treatment, he elected to proceed with surgical therapy and a  robotic prostatectomy.   HOSPITAL COURSE:  On July 17, 2007, the patient was taken to the  operating room and underwent a robotic assisted laparoscopic radical  prostatectomy.  He tolerated this procedure well without complications.  Postoperatively, he was able to be transferred to a regular hospital  room following recovery from anesthesia.  He was able to begin  ambulating the night of surgery and was monitored and remained  hemodynamically stable.   On postoperative day #1, his hematocrit was checked and found to be  stable at 33.9.  He maintained excellent urine output with minimal  output from his pelvic drain.  His pelvic drain was, therefore, removed.  He tolerated a clear liquid diet and was able to be transitioned oral  pain medication.  He was discharged home in excellent condition on  postoperative day #1.   DISPOSITION:  Home.   DISCHARGE MEDICATIONS:  Micheal [name] has been instructed to resume his  regular home medications excepting any aspirin, nonsteroidal anti-  inflammatory drugs, or herbal supplements.  He was given a prescription  to take Vicodin as needed for pain and told to use Colace as a stool  softener.  He  was also given a prescription to  begin Cipro 1 day prior  to his return visit for Foley catheter removal.   DISCHARGE INSTRUCTIONS:  The patient was instructed to be ambulatory but  specifically told to refrain from any heavy lifting, strenuous activity,  or driving.  He was instructed on routine Foley catheter care and given  a leg bag for daytime usage.  He was told to gradually advance his diet  over the course of the next couple of days   FOLLOWUP:  Micheal [name] will follow up in 1 week for removal of Foley  catheter and to discuss his surgical pathology in detail.      Heloise Purpura, MD  Electronically Signed     LB/MEDQ  D:  07/19/2007  T:  07/20/2007  Job:  [id number]

## 2011-03-19 ENCOUNTER — Other Ambulatory Visit: Payer: Self-pay | Admitting: Family Medicine

## 2011-04-11 ENCOUNTER — Encounter: Payer: Self-pay | Admitting: Family Medicine

## 2011-04-13 ENCOUNTER — Encounter: Payer: Self-pay | Admitting: Family Medicine

## 2011-04-13 ENCOUNTER — Ambulatory Visit (INDEPENDENT_AMBULATORY_CARE_PROVIDER_SITE_OTHER): Payer: BC Managed Care – PPO | Admitting: Family Medicine

## 2011-04-13 DIAGNOSIS — M791 Myalgia, unspecified site: Secondary | ICD-10-CM

## 2011-04-13 DIAGNOSIS — I10 Essential (primary) hypertension: Secondary | ICD-10-CM

## 2011-04-13 DIAGNOSIS — IMO0001 Reserved for inherently not codable concepts without codable children: Secondary | ICD-10-CM

## 2011-04-13 DIAGNOSIS — E785 Hyperlipidemia, unspecified: Secondary | ICD-10-CM

## 2011-04-13 DIAGNOSIS — G4700 Insomnia, unspecified: Secondary | ICD-10-CM

## 2011-04-13 MED ORDER — SERTRALINE HCL 50 MG PO TABS: 50.0000 mg | ORAL_TABLET | Freq: Every day | ORAL | Status: DC

## 2011-04-13 MED ORDER — LOSARTAN POTASSIUM 100 MG PO TABS: 100.0000 mg | ORAL_TABLET | Freq: Every day | ORAL | Status: DC

## 2011-04-13 MED ORDER — ALPRAZOLAM 0.5 MG PO TABS: 0.5000 mg | ORAL_TABLET | Freq: Three times a day (TID) | ORAL | Status: DC | PRN

## 2011-04-13 MED ORDER — PRAVASTATIN SODIUM 20 MG PO TABS: 20.0000 mg | ORAL_TABLET | Freq: Every day | ORAL | Status: DC

## 2011-04-13 NOTE — Telephone Encounter (Signed)
Ulice Dash at [facility] pharmacy called 7/27 @ 12:39. They are confused by the SIG on this Alprazolam and need clarification. Please call Sarah @ [phone].

## 2011-04-13 NOTE — Progress Notes (Signed)
  Subjective:    Patient ID: Micheal [name], male    DOB: May 08, 1946, 65 y.o.   MRN: [id number]  HPI Patient history hypertension, prostate cancer followed by urologist, hyperlipidemia and chronic insomnia. We recently initiated simvastatin and lipids had improved but has had some diffuse muscle soreness and aches. Also some muscle fatigue. Recent thyroid functions were normal. Testosterone normal range. Patient has concerns about simvastatin-related myalgia.  Hypertension generally well-controlled losartan 100 mg daily. Needs refills. History of some chronic anxiety stable on Zoloft. Supplements with rare alprazolam and needs refills. He takes this very rarely.  Chronic insomnia which is unchanged. Question of mild restless leg symptoms. He took some clonazepam without much improvement and stopped this recently.  No significant night time caffeine use.   Review of Systems  Constitutional: Negative for fatigue.  Eyes: Negative for visual disturbance.  Respiratory: Negative for cough, chest tightness and shortness of breath.   Cardiovascular: Negative for chest pain, palpitations and leg swelling.  Neurological: Negative for dizziness, syncope, weakness, light-headedness and headaches.       Objective:   Physical Exam  Constitutional: He is oriented to person, place, and time. He appears well-developed and well-nourished. No distress.  Neck: Neck supple.  Cardiovascular: Normal rate and regular rhythm.   Pulmonary/Chest: Effort normal and breath sounds normal. No respiratory distress. He has no wheezes. He has no rales.  Musculoskeletal: He exhibits no edema and no tenderness.  Lymphadenopathy:    He has no cervical adenopathy.  Neurological: He is alert and oriented to person, place, and time.          Assessment & Plan:  #1 Myalgias possibly related to simvastatin. Discontinue simvastatin and if symptoms fully resolve in one month, start pravastatin 20 mg daily and routine  followup 4 months #2 hypertension with marginal control by reading here. Continue weight loss exercise and reassess 4 months #3 history of chronic anxiety. Refill sertraline as well as alprazolam which he rarely uses

## 2011-04-16 NOTE — Telephone Encounter (Signed)
Spoke with pharmacy

## 2011-06-14 LAB — PROTIME-INR
INR: 1.1
INR: 1.2
INR: 1.3
Prothrombin Time: 12.5
Prothrombin Time: 15.9 — ABNORMAL HIGH
Prothrombin Time: 16.5 — ABNORMAL HIGH

## 2011-06-14 LAB — COMPREHENSIVE METABOLIC PANEL
ALT: 25
AST: 32
Albumin: 3.8
Alkaline Phosphatase: 81
Calcium: 9.6
GFR calc Af Amer: >60
Glucose, Bld: 123 — ABNORMAL HIGH
Potassium: 3.6
Sodium: 143
Total Protein: 6.9

## 2011-06-14 LAB — BASIC METABOLIC PANEL
BUN: 13
BUN: 18
BUN: 21
CO2: 27
CO2: 31
Calcium: 7.8 — ABNORMAL LOW
Calcium: 8 — ABNORMAL LOW
Calcium: 8.7
Chloride: 97
Creatinine, Ser: 0.94
Creatinine, Ser: 1.37
GFR calc Af Amer: >60
GFR calc non Af Amer: 50 — ABNORMAL LOW
GFR calc non Af Amer: >60
Glucose, Bld: 136 — ABNORMAL HIGH
Glucose, Bld: 136 — ABNORMAL HIGH

## 2011-06-14 LAB — CBC
HCT: 30.1 — ABNORMAL LOW
HCT: 36.4 — ABNORMAL LOW
Hemoglobin: 12.4 — ABNORMAL LOW
MCHC: 34
MCHC: 34.3
MCHC: 34.4
MCV: 93.7
Platelets: 193
Platelets: 205
Platelets: 267
Platelets: 278
RBC: 3.48 — ABNORMAL LOW
RDW: 13.2
RDW: 13.5
RDW: 13.5
RDW: 13.7
WBC: 12.8 — ABNORMAL HIGH

## 2011-06-14 LAB — URINALYSIS, ROUTINE W REFLEX MICROSCOPIC
Nitrite: NEGATIVE
Protein, ur: NEGATIVE
Specific Gravity, Urine: 1.017
Urobilinogen, UA: 0.2

## 2011-06-14 LAB — TYPE AND SCREEN
ABO/RH(D): O POS
Antibody Screen: NEGATIVE

## 2011-06-27 LAB — BASIC METABOLIC PANEL
CO2: 29
Calcium: 9.9
Creatinine, Ser: 0.7
GFR calc Af Amer: >60
GFR calc non Af Amer: >60
Sodium: 142

## 2011-06-27 LAB — CBC
MCHC: 35.2
RBC: 4.63

## 2011-06-27 LAB — TYPE AND SCREEN
ABO/RH(D): O POS
Antibody Screen: NEGATIVE

## 2011-06-27 LAB — HEMOGLOBIN AND HEMATOCRIT, BLOOD: Hemoglobin: 13

## 2011-08-14 ENCOUNTER — Ambulatory Visit: Payer: BC Managed Care – PPO | Admitting: Family Medicine

## 2012-02-26 ENCOUNTER — Other Ambulatory Visit: Payer: Self-pay | Admitting: Gastroenterology

## 2012-02-26 DIAGNOSIS — Z0000 Encounter for general adult medical examination without abnormal findings: Secondary | ICD-10-CM

## 2012-02-27 ENCOUNTER — Other Ambulatory Visit (INDEPENDENT_AMBULATORY_CARE_PROVIDER_SITE_OTHER): Payer: Medicare Other

## 2012-02-27 DIAGNOSIS — Z0000 Encounter for general adult medical examination without abnormal findings: Secondary | ICD-10-CM | POA: Diagnosis not present

## 2012-02-27 DIAGNOSIS — I10 Essential (primary) hypertension: Secondary | ICD-10-CM

## 2012-02-27 DIAGNOSIS — E785 Hyperlipidemia, unspecified: Secondary | ICD-10-CM

## 2012-02-27 DIAGNOSIS — G252 Other specified forms of tremor: Secondary | ICD-10-CM

## 2012-02-27 DIAGNOSIS — G250 Essential tremor: Secondary | ICD-10-CM | POA: Diagnosis not present

## 2012-02-27 LAB — CBC WITH DIFFERENTIAL/PLATELET
Basophils Relative: 0.8 % (ref 0.0–3.0)
Eosinophils Relative: 3.2 % (ref 0.0–5.0)
HCT: 44.4 % (ref 39.0–52.0)
Hemoglobin: 14.9 g/dL (ref 13.0–17.0)
Lymphs Abs: 2.4 10*3/uL (ref 0.7–4.0)
Monocytes Relative: 8.5 % (ref 3.0–12.0)
Neutro Abs: 4.2 10*3/uL (ref 1.4–7.7)
WBC: 7.5 10*3/uL (ref 4.5–10.5)

## 2012-02-27 LAB — TSH: TSH: 1.88 u[IU]/mL (ref 0.35–5.50)

## 2012-02-27 LAB — BASIC METABOLIC PANEL
CO2: 24 mEq/L (ref 19–32)
Chloride: 104 mEq/L (ref 96–112)
Potassium: 4.1 mEq/L (ref 3.5–5.1)

## 2012-02-27 LAB — HEPATIC FUNCTION PANEL
Albumin: 4.3 g/dL (ref 3.5–5.2)
Bilirubin, Direct: 0 mg/dL (ref 0.0–0.3)
Total Protein: 7.5 g/dL (ref 6.0–8.3)

## 2012-02-27 LAB — POCT URINALYSIS DIPSTICK
Bilirubin, UA: NEGATIVE
Blood, UA: NEGATIVE
Ketones, UA: NEGATIVE
Leukocytes, UA: NEGATIVE
Spec Grav, UA: 1.02
pH, UA: 5.5

## 2012-02-27 LAB — LIPID PANEL
Cholesterol: 130 mg/dL (ref 0–200)
LDL Cholesterol: 62 mg/dL (ref 0–99)
Total CHOL/HDL Ratio: 3
Triglycerides: 153 mg/dL — ABNORMAL HIGH (ref 0.0–149.0)
VLDL: 30.6 mg/dL (ref 0.0–40.0)

## 2012-02-28 DIAGNOSIS — M1710 Unilateral primary osteoarthritis, unspecified knee: Secondary | ICD-10-CM | POA: Diagnosis not present

## 2012-03-04 DIAGNOSIS — M1710 Unilateral primary osteoarthritis, unspecified knee: Secondary | ICD-10-CM | POA: Diagnosis not present

## 2012-03-05 ENCOUNTER — Ambulatory Visit (INDEPENDENT_AMBULATORY_CARE_PROVIDER_SITE_OTHER): Payer: Medicare Other | Admitting: Family Medicine

## 2012-03-05 ENCOUNTER — Encounter: Payer: Self-pay | Admitting: Family Medicine

## 2012-03-05 VITALS — BP 132/92 | HR 88 | Temp 98.0°F | Resp 12 | Ht 70.5 in | Wt 201.0 lb

## 2012-03-05 DIAGNOSIS — I10 Essential (primary) hypertension: Secondary | ICD-10-CM

## 2012-03-05 DIAGNOSIS — G4700 Insomnia, unspecified: Secondary | ICD-10-CM | POA: Diagnosis not present

## 2012-03-05 DIAGNOSIS — Z0000 Encounter for general adult medical examination without abnormal findings: Secondary | ICD-10-CM

## 2012-03-05 DIAGNOSIS — E785 Hyperlipidemia, unspecified: Secondary | ICD-10-CM

## 2012-03-05 DIAGNOSIS — G250 Essential tremor: Secondary | ICD-10-CM | POA: Diagnosis not present

## 2012-03-05 DIAGNOSIS — G252 Other specified forms of tremor: Secondary | ICD-10-CM | POA: Diagnosis not present

## 2012-03-05 DIAGNOSIS — R490 Dysphonia: Secondary | ICD-10-CM

## 2012-03-05 MED ORDER — METOPROLOL SUCCINATE ER 25 MG PO TB24: 25.0000 mg | ORAL_TABLET | Freq: Every day | ORAL | Status: DC

## 2012-03-05 NOTE — Progress Notes (Signed)
Subjective:    Patient ID: Micheal [name], male    DOB: Jun 18, 1946, 66 y.o.   MRN: [id number]  HPI  Pacing for complete physical. He just turned 66. Has chronic problems include hypertension, hyperlipidemia, history of prostate cancer and history of kidney stones. Medications reviewed. On losartan 100 mg daily for hypertension. He's also had some tremor which is hereditary, upper extremity and worse with activity and somewhat progressive. This is becoming more bothersome.  He relates some voice change chronically for several months. Intermittent laryngitis type symptoms. No GERD symptoms. No postnasal drip symptoms. No appetite or weight changes. Nonsmoker.  Intermittent right shoulder pains. Occasionally at night. No weakness. No history of injury. Poorly localized.  Past Medical History  Diagnosis Date  . Arthritis   . Hyperlipidemia   . Hypertension   . Kidney stone   . Urinary incontinence   . Cancer     prostate   Past Surgical History  Procedure Date  . Prostatectomy   . Lumbar disc surgery 2004  . Knee arthroscopy 2010    bilateral  . Total knee arthroplasty 2009    left knee    reports that he has never smoked. He does not have any smokeless tobacco history on file. He reports that he drinks alcohol. He reports that he does not use illicit drugs. family history includes Aneurysm in his father; Heart attack in an unspecified family member; and Sudden death in his mother. No Known Allergies    Review of Systems  Constitutional: Negative for fever, activity change, appetite change and fatigue.  HENT: Positive for voice change. Negative for ear pain, congestion, sore throat, mouth sores and trouble swallowing.   Eyes: Negative for pain and visual disturbance.  Respiratory: Negative for cough, shortness of breath and wheezing.   Cardiovascular: Negative for chest pain and palpitations.  Gastrointestinal: Negative for nausea, vomiting, abdominal pain, diarrhea,  constipation, blood in stool, abdominal distention and rectal pain.  Genitourinary: Negative for dysuria, hematuria and testicular pain.  Musculoskeletal: Positive for arthralgias (Shoulder pains as per history of present illness). Negative for joint swelling.  Skin: Negative for rash.  Neurological: Negative for dizziness, syncope and headaches.  Hematological: Negative for adenopathy.  Psychiatric/Behavioral: Negative for confusion and dysphoric mood.       Objective:   Physical Exam  Constitutional: He is oriented to person, place, and time. He appears well-developed and well-nourished. No distress.  HENT:  Head: Normocephalic and atraumatic.  Right Ear: External ear normal.  Left Ear: External ear normal.  Mouth/Throat: Oropharynx is clear and moist.  Eyes: Conjunctivae and EOM are normal. Pupils are equal, round, and reactive to light.  Neck: Normal range of motion. Neck supple. No thyromegaly present.  Cardiovascular: Normal rate, regular rhythm and normal heart sounds.   No murmur heard. Pulmonary/Chest: No respiratory distress. He has no wheezes. He has no rales.  Abdominal: Soft. Bowel sounds are normal. He exhibits no distension and no mass. There is no tenderness. There is no rebound and no guarding.  Genitourinary: Rectum normal.       Previous prostatectomy  Musculoskeletal: He exhibits no edema.       Full range of motion right shoulder. Minimal pain with external rotation. No pain with abduction or internal rotation.  Lymphadenopathy:    He has no cervical adenopathy.  Neurological: He is alert and oriented to person, place, and time. He displays normal reflexes. No cranial nerve deficit.  Skin: No rash noted.  Psychiatric: He has  a normal mood and affect.          Assessment & Plan:  #1 complete physical. Hemoccult cards given. Immunizations up to date. #2 hypertension. Somewhat poorly controlled by several home readings. Low-dose Toprol-XL 25 mg once daily  and continue losartan #3 essential tremor. Try low-dose Toprol.  Use cautiously because he tends to have pulse in the 60s range. Reassess one month #4 intermittent/chronic hoarseness and voice change. ENT referral. He does not have any obvious postnasal drip or GERD symptoms. Nonsmoker.

## 2012-03-06 DIAGNOSIS — M1710 Unilateral primary osteoarthritis, unspecified knee: Secondary | ICD-10-CM | POA: Diagnosis not present

## 2012-03-11 DIAGNOSIS — M1710 Unilateral primary osteoarthritis, unspecified knee: Secondary | ICD-10-CM | POA: Diagnosis not present

## 2012-03-12 DIAGNOSIS — R490 Dysphonia: Secondary | ICD-10-CM | POA: Diagnosis not present

## 2012-03-13 DIAGNOSIS — M1710 Unilateral primary osteoarthritis, unspecified knee: Secondary | ICD-10-CM | POA: Diagnosis not present

## 2012-03-18 DIAGNOSIS — M1710 Unilateral primary osteoarthritis, unspecified knee: Secondary | ICD-10-CM | POA: Diagnosis not present

## 2012-03-19 ENCOUNTER — Telehealth: Payer: Self-pay | Admitting: Family Medicine

## 2012-03-19 NOTE — Telephone Encounter (Signed)
Caller: Dray/Patient; PCP: Evelena Peat; CB#: [phone]; ; ; Call regarding Message To Dr. Caryl Never;   Patient states he is calling to report BP readings to Dr. Caryl Never. Patient states his BP medication was changed 03/05/12.  States he was prescribed Metoprolol  SR 25mg . daily. Patient states no changes noted in BP. States average BP 143/83, pulse rate 52-56. Patient states he is asymptomatic.  Patient denies headache, dizziness, chest pain or shortness of breath. Denies numbness or tingling. Triage per Hypertension Protocol. No emergent sx identified. Care advice and diet advice given per guidelines. Call back parameters reviewed. Patient verbalizes understanding.  PATIENT IS CALLING TO REPORT BP READINGS TO DR. Caryl Never POST MEDICATION CHANGE 03/05/12. PATIENT HAS F/U APPT. SCHEDULED 04/04/12. PATIENT INQUIRING IF HE IS TO REMAIN ON CURRENT MEDICATION/DOSAGE UNTIL F/U APPT. OR IF ANY CHANGES ARE RECOMMENDED. PATIENT ALSO INQUIRING IF F/U IS RECOMMENDED SOONER THAN PLANNED ON 04/04/12. PATIENT USES [facility] ON [address] AT [phone]. PATIENT CAN BE REACHED AT [phone]. Message sent to CAN Pool via Epic EHR.

## 2012-03-19 NOTE — Telephone Encounter (Signed)
Pt informed and he voiced his understanding. 

## 2012-03-19 NOTE — Telephone Encounter (Signed)
Same meds until follow up.  I would not increase metoprolol further at this time b/o low pulse.

## 2012-03-19 NOTE — Telephone Encounter (Signed)
Change was made at OV 6/19, has F/U on 7/19.

## 2012-04-04 ENCOUNTER — Ambulatory Visit (INDEPENDENT_AMBULATORY_CARE_PROVIDER_SITE_OTHER): Payer: Medicare Other | Admitting: Family Medicine

## 2012-04-04 ENCOUNTER — Encounter: Payer: Self-pay | Admitting: Family Medicine

## 2012-04-04 VITALS — BP 132/82 | Temp 98.4°F | Wt 199.0 lb

## 2012-04-04 DIAGNOSIS — I10 Essential (primary) hypertension: Secondary | ICD-10-CM

## 2012-04-04 MED ORDER — LOSARTAN POTASSIUM 100 MG PO TABS: 100.0000 mg | ORAL_TABLET | Freq: Every day | ORAL | Status: DC

## 2012-04-04 NOTE — Patient Instructions (Addendum)
Start back Losartan one half tablet one daily for 3 days and if BP still up then go to one daily.

## 2012-04-04 NOTE — Progress Notes (Signed)
  Subjective:    Patient ID: Micheal [name], male    DOB: May 07, 1946, 66 y.o.   MRN: [id number]  HPI  Followup hypertension. We added metoprolol 25 mg last visit. Patient had misunderstanding and stopped his losartan. Blood pressures are essentially unchanged. He has not had any problems with bradycardia or dizziness. No cough. Pulse around 60. He has not seen any improvement in essential tremor though we explained this may be too low a dose of beta blocker to help significantly.  He still has some systolic readings 160s but mostly 140s.    Review of Systems  Constitutional: Negative for fatigue.  Eyes: Negative for visual disturbance.  Respiratory: Negative for cough, chest tightness and shortness of breath.   Cardiovascular: Negative for chest pain, palpitations and leg swelling.  Neurological: Negative for dizziness, syncope, weakness, light-headedness and headaches.       Objective:   Physical Exam  Constitutional: He appears well-developed and well-nourished.  Neck: Neck supple. No thyromegaly present.  Cardiovascular: Normal rate and regular rhythm.   Pulmonary/Chest: Effort normal and breath sounds normal. No respiratory distress. He has no wheezes. He has no rales.  Musculoskeletal: He exhibits no edema.          Assessment & Plan:  Hypertension. Still suboptimal control by home readings. Patient had misunderstanding and stopped losartan. Start back losartan 100 mg one half tablet daily for 3-4 days and if blood pressure still not well controlled then to one tablet. Continue low-dose metoprolol. Be in touch in one month for followup regarding readings. We elected to avoid HCTZ because of prior history of hyponatremia. Avoiding amlodipine because of his current uses simvastatin

## 2012-04-16 ENCOUNTER — Telehealth: Payer: Self-pay | Admitting: *Deleted

## 2012-04-16 MED ORDER — LOSARTAN POTASSIUM-HCTZ 100-12.5 MG PO TABS: 1.0000 | ORAL_TABLET | Freq: Every day | ORAL | Status: DC

## 2012-04-16 NOTE — Telephone Encounter (Signed)
Pt informed Rx sent to [facility], pt will call back for OV in one month

## 2012-04-22 ENCOUNTER — Other Ambulatory Visit: Payer: Self-pay | Admitting: Family Medicine

## 2012-04-28 ENCOUNTER — Telehealth: Payer: Self-pay | Admitting: Family Medicine

## 2012-04-28 MED ORDER — SIMVASTATIN 40 MG PO TABS: 40.0000 mg | ORAL_TABLET | Freq: Every evening | ORAL | Status: DC

## 2012-04-28 NOTE — Telephone Encounter (Signed)
Pt came by office and is req that simvastatin (ZOCOR) 40 MG tablet be sent to [facility] mail order pharmacy.   DO NOT SEND TO [facility].

## 2012-04-29 DIAGNOSIS — M1710 Unilateral primary osteoarthritis, unspecified knee: Secondary | ICD-10-CM | POA: Diagnosis not present

## 2012-05-16 DIAGNOSIS — C61 Malignant neoplasm of prostate: Secondary | ICD-10-CM | POA: Diagnosis not present

## 2012-05-22 ENCOUNTER — Other Ambulatory Visit: Payer: Self-pay | Admitting: *Deleted

## 2012-05-22 MED ORDER — LOSARTAN POTASSIUM-HCTZ 100-12.5 MG PO TABS: 1.0000 | ORAL_TABLET | Freq: Every day | ORAL | Status: DC

## 2012-05-22 MED ORDER — SERTRALINE HCL 50 MG PO TABS: 50.0000 mg | ORAL_TABLET | Freq: Every day | ORAL | Status: DC

## 2012-05-22 MED ORDER — METOPROLOL SUCCINATE ER 25 MG PO TB24: 25.0000 mg | ORAL_TABLET | Freq: Every day | ORAL | Status: DC

## 2012-05-23 DIAGNOSIS — N529 Male erectile dysfunction, unspecified: Secondary | ICD-10-CM | POA: Diagnosis not present

## 2012-05-23 DIAGNOSIS — N200 Calculus of kidney: Secondary | ICD-10-CM | POA: Diagnosis not present

## 2012-05-23 DIAGNOSIS — C61 Malignant neoplasm of prostate: Secondary | ICD-10-CM | POA: Diagnosis not present

## 2012-07-22 ENCOUNTER — Telehealth: Payer: Self-pay | Admitting: *Deleted

## 2012-07-22 MED ORDER — AMLODIPINE BESYLATE 5 MG PO TABS: 5.0000 mg | ORAL_TABLET | Freq: Every day | ORAL | Status: DC

## 2012-07-22 NOTE — Telephone Encounter (Signed)
Pt reporting BP readings which are ranging from 170/92, 153/89, 134/80.  Per Dr Caryl Never, D/C Metoprolol, start Amlodipine 5 mg daily.  Will scan pt letter to chart. Pt informed, Amlodipine sent to [facility]

## 2012-08-04 ENCOUNTER — Telehealth: Payer: Self-pay | Admitting: Family Medicine

## 2012-08-04 NOTE — Telephone Encounter (Signed)
Pt in agreement with taking 1/2 tab Simvastatin daily.  Form filled out and faxed back to [facility].  Change made on med list FYI

## 2012-08-04 NOTE — Telephone Encounter (Signed)
This risk of this interaction is fairly minimal.  However, I would suggest the following:  Reduce Simvastatin to 20 mg daily (could take 1/2 tablet daily) and continue with amlodipine.  I would consider this option because his lipids were very well controlled at last check.  If he has reservations about trying lower dose of Simvastatin, we could try another alternative such as Cardizem, though I feel Amlodipine will do best job with his hypertension.

## 2012-08-04 NOTE — Telephone Encounter (Signed)
This med was filled on 07/22/12, #90 with 3 refills.  I do not remember a form from [facility] regarding this.

## 2012-08-04 NOTE — Telephone Encounter (Signed)
Patient called stating that the Pharmacist with [facility] has tried several time to contact the office regarding an rx interaction with the patient's amLODipine (NORVASC) 5 MG tablet [[id number]]  Take 1poqd and has cancelled the order of this rx due to no response. Please call the [facility] Pharmacist at [phone]. Order no. [id number] and inform patient when complete

## 2012-08-05 ENCOUNTER — Encounter: Payer: Self-pay | Admitting: Family Medicine

## 2012-09-05 ENCOUNTER — Telehealth: Payer: Self-pay | Admitting: *Deleted

## 2012-09-05 MED ORDER — ATORVASTATIN CALCIUM 20 MG PO TABS: 20.0000 mg | ORAL_TABLET | Freq: Every day | ORAL | Status: DC

## 2012-09-05 MED ORDER — AMLODIPINE BESYLATE 10 MG PO TABS: 10.0000 mg | ORAL_TABLET | Freq: Every day | ORAL | Status: DC

## 2012-09-05 NOTE — Telephone Encounter (Signed)
Pt sent letter with BP readings from 08/12/12 to 08/26/12. He wanted to report his BP readings after dropping the metoprolol and adding the amlodipine to the losartan/hctz.  Letter will be scanned to pt chart.  There doesn't seem to be much difference in the readings regardless of what medicine I use in conjunction with the losartan.  After review of readings, Dr Caryl Never wanted to D/C the simvastatin, start Lipitor 20 mg daily and increase Amlodipine to 10 mg daily.  Pt informed meds sent to local pharmacy, [facility], he has OV 12/30

## 2012-09-15 ENCOUNTER — Encounter: Payer: Self-pay | Admitting: Family Medicine

## 2012-09-15 ENCOUNTER — Ambulatory Visit (INDEPENDENT_AMBULATORY_CARE_PROVIDER_SITE_OTHER): Payer: Medicare Other | Admitting: Family Medicine

## 2012-09-15 VITALS — BP 132/82 | HR 88 | Temp 98.4°F | Wt 205.0 lb

## 2012-09-15 DIAGNOSIS — I10 Essential (primary) hypertension: Secondary | ICD-10-CM

## 2012-09-15 NOTE — Progress Notes (Signed)
  Subjective:    Patient ID: Micheal [name], male    DOB: 10-30-1945, 66 y.o.   MRN: [id number]  HPI Followup hypertension. Patient has struggled to get good control by home readings. We recently increased amlodipine to 10 mg and switched him from simvastatin to Lipitor to accommodate higher doses of amlodipine. He also been on losartan HCTZ and low dose metoprolol 25 mg daily. Patient's blood pressures were still running borderline high at times and a few days ago he decided to stop all medications. Surprisingly, his blood pressures have not rebounded since stopping those medications. Denies any headaches. No dizziness. No chest pains.   Review of Systems  Constitutional: Negative for fatigue.  Eyes: Negative for visual disturbance.  Respiratory: Negative for cough, chest tightness and shortness of breath.   Cardiovascular: Negative for chest pain, palpitations and leg swelling.  Neurological: Negative for dizziness, syncope, weakness, light-headedness and headaches.       Objective:   Physical Exam  Constitutional: He appears well-developed and well-nourished.  Cardiovascular: Normal rate and regular rhythm.   Pulmonary/Chest: Effort normal and breath sounds normal. No respiratory distress. He has no wheezes. He has no rales.          Assessment & Plan:  Hypertension. Surprisingly his numbers look good today off of all medications. I confirmed left arm seated 132/82. Continue to monitor. We are getting higher readings with his monitor and we have suggested manual cuff if possible. He will return Friday for nurse recheck with blood pressure

## 2012-09-15 NOTE — Patient Instructions (Addendum)
Continue to monitor blood pressure and let's plan nurse BP recheck this Friday.

## 2012-09-19 ENCOUNTER — Ambulatory Visit (INDEPENDENT_AMBULATORY_CARE_PROVIDER_SITE_OTHER): Payer: Medicare Other | Admitting: Family Medicine

## 2012-09-19 ENCOUNTER — Encounter: Payer: Self-pay | Admitting: Family Medicine

## 2012-09-19 VITALS — BP 160/98

## 2012-09-19 DIAGNOSIS — I10 Essential (primary) hypertension: Secondary | ICD-10-CM | POA: Diagnosis not present

## 2012-09-19 NOTE — Progress Notes (Signed)
  Subjective:    Patient ID: Micheal [name], male    DOB: [birth year], 67 y.o.   MRN: [id number]  HPI Patient is here for brief blood pressure check with nurse-and she consulted me to recheck his BP. By his automated machine 161/98 and nurse obtained a reading of 160/98. Check blood pressure manually left arm seated 130/86 and systolic also confirmed by palpation. Has been off amlodipine and losartan HCTZ for over one week. Generally feels well with no side effects   Review of Systems     Objective:   Physical Exam  Constitutional: He appears well-developed and well-nourished.  Cardiovascular: Normal rate and regular rhythm.             Hypertension. Stable by home readings and repeat reading here today. Continued observation. His automated cuff varies significantly from manual checks here with his cuff consistently higher.  He will get manuel cuff and follow up 2 weeks.

## 2012-09-26 ENCOUNTER — Encounter: Payer: Self-pay | Admitting: Family Medicine

## 2012-10-02 ENCOUNTER — Encounter: Payer: Self-pay | Admitting: Family Medicine

## 2012-10-02 NOTE — Telephone Encounter (Signed)
Home BPs reviewed and consistently up.  I want to start him on Benicar 20 mg once daily. Samples OK and if none call into his pharmacy. We need to remove Losartan HCTZ and Amlodipine from his med list.

## 2012-10-03 MED ORDER — OLMESARTAN MEDOXOMIL 20 MG PO TABS: 20.0000 mg | ORAL_TABLET | Freq: Every day | ORAL | Status: DC

## 2012-11-01 ENCOUNTER — Other Ambulatory Visit: Payer: Self-pay

## 2012-11-07 ENCOUNTER — Encounter: Payer: Self-pay | Admitting: Family Medicine

## 2012-12-01 ENCOUNTER — Encounter: Payer: Self-pay | Admitting: Family Medicine

## 2012-12-01 MED ORDER — OLMESARTAN MEDOXOMIL 20 MG PO TABS: 20.0000 mg | ORAL_TABLET | Freq: Every day | ORAL | Status: DC

## 2013-01-01 ENCOUNTER — Ambulatory Visit (INDEPENDENT_AMBULATORY_CARE_PROVIDER_SITE_OTHER): Payer: Medicare Other | Admitting: Sports Medicine

## 2013-01-01 VITALS — BP 115/74 | Ht 70.0 in | Wt 180.0 lb

## 2013-01-01 DIAGNOSIS — S83429A Sprain of lateral collateral ligament of unspecified knee, initial encounter: Secondary | ICD-10-CM | POA: Insufficient documentation

## 2013-01-01 DIAGNOSIS — M1711 Unilateral primary osteoarthritis, right knee: Secondary | ICD-10-CM | POA: Insufficient documentation

## 2013-01-01 DIAGNOSIS — S83422A Sprain of lateral collateral ligament of left knee, initial encounter: Secondary | ICD-10-CM

## 2013-01-01 DIAGNOSIS — M722 Plantar fascial fibromatosis: Secondary | ICD-10-CM | POA: Diagnosis not present

## 2013-01-01 DIAGNOSIS — M1710 Unilateral primary osteoarthritis, unspecified knee: Secondary | ICD-10-CM

## 2013-01-01 NOTE — Assessment & Plan Note (Signed)
This is a frayed tendon and I am not sure that surgery would stablize this well  I would like to see the type of brace that was given by Dr Despina Hick  He may need to use this more Patellar tendon band given today to see if this helps and he can investigate other knee sleeves to see if any are more useful to him  Reck for orthotics and shoulder when he wishes to do so

## 2013-01-01 NOTE — Progress Notes (Signed)
  Subjective:    Patient ID: Micheal [name], male    DOB: September 19, 1945, 66 y.o.   MRN: [id number]  HPI  Pt presents to clinic for evaluation of left heel pain. Worse with increased standing and walking, and 1st thing in the mornings.  Going on a trip to [place] in May, concerned about foot pain with increased walking.   Hx of lt knee replacement x 5 years ago by Dr. Despina Hick.  Having problems with lt knee giving way and anterior lt knee pain. Hx of arthritis in rt knee, but not considering knee replacement for this yet. Meniscus surgery on RT with arthroscopy did not help  Pain in heel and arch is worse in mornings but not every day  Note he also has shoulder pain at nights on RT that he thinks relates to an older golf injury   Review of Systems     Objective:   Physical Exam  NAD  Lt knee  No obvious effusion Mid line scar from quad down to tib tubercle ttp at tib tub  Anterior drawer- increased motion of components Lachman- moderately tight MCL- tight LCL - looser than MCL   Rt knee Full extension and flexion to 130 Has medial joint line hypertrophy Crepitation with bending  Good hip abduction strength bilat Leg lengths equal  Lt heel: Not much ttp over insertion of PF No pain with percussion of heel '  Cavus style foot  Note shoulder on RT has full ROM  [facility] [place] Left knee shows a mild suprapatellar pouch effusion all along the components Patellar tendon shows distal spurring and also some calcification at tib tubercle QT is intact MCL intact LCL is frayed and hypoechoic at distal insertion to fib head There is fluid around popliteus tendon  PF Lt PF is 0.73cm and should be 0.45 Hypoechoic change at insertion The PF is thicker out 4 cm into the arch       Assessment & Plan:

## 2013-01-01 NOTE — Assessment & Plan Note (Signed)
Begin with arch straps He has OTC orthotic inserts and heel lift added to these Std stretches HEP  I think he needs a custom orthotic as he has arch breakdown and early bunion change With his knee issues this would also allow more cushion

## 2013-01-01 NOTE — Patient Instructions (Addendum)
Please do suggested exercises for plantar fasciitis   Try using arch strap to support your foot when you are walking  You may benefit significantly from a custom orthotic, we could make these here in our office- please schedule a f/u if interested  Please follow up for plantar fasciitis is 6 weeks  Thank you for seeing [place] today!

## 2013-01-01 NOTE — Assessment & Plan Note (Signed)
I think he would benefit from compression sleeve He tried a body helix on left but this caused some pain with the degree of compression so we held off at this point Orthotics may help

## 2013-01-06 ENCOUNTER — Encounter: Payer: Self-pay | Admitting: Sports Medicine

## 2013-01-15 ENCOUNTER — Ambulatory Visit: Payer: PRIVATE HEALTH INSURANCE | Admitting: Sports Medicine

## 2013-01-21 ENCOUNTER — Encounter: Payer: Self-pay | Admitting: Family Medicine

## 2013-01-21 ENCOUNTER — Ambulatory Visit (INDEPENDENT_AMBULATORY_CARE_PROVIDER_SITE_OTHER): Payer: Medicare Other | Admitting: Family Medicine

## 2013-01-21 VITALS — BP 105/65 | HR 64 | Ht 70.0 in | Wt 180.0 lb

## 2013-01-21 DIAGNOSIS — M1710 Unilateral primary osteoarthritis, unspecified knee: Secondary | ICD-10-CM

## 2013-01-21 DIAGNOSIS — IMO0002 Reserved for concepts with insufficient information to code with codable children: Secondary | ICD-10-CM | POA: Diagnosis not present

## 2013-01-21 DIAGNOSIS — M722 Plantar fascial fibromatosis: Secondary | ICD-10-CM | POA: Diagnosis not present

## 2013-01-21 DIAGNOSIS — M1711 Unilateral primary osteoarthritis, right knee: Secondary | ICD-10-CM

## 2013-01-21 NOTE — Patient Instructions (Addendum)
Thank you for coming in today. Let us know what your Orthopedic Surgeon thinks.  Come back as needed or a few months from the plantar fascia.  Use the orthotics.

## 2013-01-21 NOTE — Progress Notes (Signed)
Micheal [name] is a 67 y.o. male who presents to [facility] today for orthotic evaluation.  Micheal [name] was last seen on April 17th. 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  A decision was made at that visit to elect for custom orthotics.  The goal for improving plantar fasciitis pain as well as provided more cushion for chronic knee pain.  Additionally this was used to correct a cavus foot an abnormal gait.  Patient feels well however notes continued knee pain. He denies any radiating pain weakness or numbness.    PMH reviewed. Plantar fasciitis History  Substance Use Topics  . Smoking status: Never Smoker   . Smokeless tobacco: Never Used  . Alcohol Use: Yes   ROS as above otherwise neg   Exam:  There were no vitals taken for this visit. Gen: Well NAD MSK: Lt knee  No obvious effusion  Mid line scar from quad down to tib tubercle  ttp at tib tub  Anterior drawer- increased motion of components  Lachman- moderately tight  MCL- tight  LCL - looser than MCL   Rt knee  Full extension and flexion to 130  Has medial joint line hypertrophy  Crepitation with bending  Good hip abduction strength bilat  Leg lengths equal   Lt heel:  Not much ttp over insertion of PF No pain with percussion of heel '   Cavus style foot   Gait. Supinated gait  Patient was fitted for a : standard, cushioned, semi-rigid orthotic. The orthotic was heated and afterward the patient stood on the orthotic blank positioned on the orthotic stand. The patient was positioned in subtalar neutral position and 10 degrees of ankle dorsiflexion in a weight bearing stance. After completion of molding, a stable base was applied to the orthotic blank. The blank was ground to a stable position for weight bearing. Size: 8 Base: Blue EVA Posting and Padding:  Assessment and plan:  Agree for custom orthotics today.   These were made today to aid in gait, cushion, and plantar fasciitis.   40 minute greater than 20 minutes face-to-face time.

## 2013-02-17 ENCOUNTER — Encounter: Payer: Self-pay | Admitting: Sports Medicine

## 2013-02-23 DIAGNOSIS — H2510 Age-related nuclear cataract, unspecified eye: Secondary | ICD-10-CM | POA: Diagnosis not present

## 2013-02-23 DIAGNOSIS — H02059 Trichiasis without entropian unspecified eye, unspecified eyelid: Secondary | ICD-10-CM | POA: Diagnosis not present

## 2013-02-24 ENCOUNTER — Ambulatory Visit: Payer: Medicare Other | Admitting: Sports Medicine

## 2013-02-26 DIAGNOSIS — Z96659 Presence of unspecified artificial knee joint: Secondary | ICD-10-CM | POA: Diagnosis not present

## 2013-02-26 DIAGNOSIS — M1710 Unilateral primary osteoarthritis, unspecified knee: Secondary | ICD-10-CM | POA: Diagnosis not present

## 2013-04-22 ENCOUNTER — Other Ambulatory Visit: Payer: Self-pay

## 2013-05-06 DIAGNOSIS — C61 Malignant neoplasm of prostate: Secondary | ICD-10-CM | POA: Diagnosis not present

## 2013-05-13 DIAGNOSIS — C61 Malignant neoplasm of prostate: Secondary | ICD-10-CM | POA: Diagnosis not present

## 2013-05-13 DIAGNOSIS — N529 Male erectile dysfunction, unspecified: Secondary | ICD-10-CM | POA: Diagnosis not present

## 2013-06-03 ENCOUNTER — Other Ambulatory Visit: Payer: Self-pay | Admitting: Dermatology

## 2013-06-03 DIAGNOSIS — L570 Actinic keratosis: Secondary | ICD-10-CM | POA: Diagnosis not present

## 2013-06-03 DIAGNOSIS — D0460 Carcinoma in situ of skin of unspecified upper limb, including shoulder: Secondary | ICD-10-CM | POA: Diagnosis not present

## 2013-06-03 DIAGNOSIS — L259 Unspecified contact dermatitis, unspecified cause: Secondary | ICD-10-CM | POA: Diagnosis not present

## 2013-06-03 DIAGNOSIS — C4492 Squamous cell carcinoma of skin, unspecified: Secondary | ICD-10-CM

## 2013-06-03 DIAGNOSIS — D485 Neoplasm of uncertain behavior of skin: Secondary | ICD-10-CM | POA: Diagnosis not present

## 2013-06-03 DIAGNOSIS — L820 Inflamed seborrheic keratosis: Secondary | ICD-10-CM | POA: Diagnosis not present

## 2013-06-03 HISTORY — DX: Squamous cell carcinoma of skin, unspecified: C44.92

## 2013-06-08 DIAGNOSIS — M5137 Other intervertebral disc degeneration, lumbosacral region: Secondary | ICD-10-CM | POA: Diagnosis not present

## 2013-06-08 DIAGNOSIS — M542 Cervicalgia: Secondary | ICD-10-CM | POA: Diagnosis not present

## 2013-06-08 DIAGNOSIS — M545 Low back pain: Secondary | ICD-10-CM | POA: Diagnosis not present

## 2013-06-08 DIAGNOSIS — M5412 Radiculopathy, cervical region: Secondary | ICD-10-CM | POA: Diagnosis not present

## 2013-06-18 DIAGNOSIS — Z23 Encounter for immunization: Secondary | ICD-10-CM | POA: Diagnosis not present

## 2013-07-23 ENCOUNTER — Other Ambulatory Visit: Payer: Self-pay

## 2013-07-23 DIAGNOSIS — D0460 Carcinoma in situ of skin of unspecified upper limb, including shoulder: Secondary | ICD-10-CM | POA: Diagnosis not present

## 2013-10-14 ENCOUNTER — Other Ambulatory Visit: Payer: Self-pay | Admitting: Family Medicine

## 2013-10-14 DIAGNOSIS — N200 Calculus of kidney: Secondary | ICD-10-CM | POA: Diagnosis not present

## 2013-10-14 DIAGNOSIS — N390 Urinary tract infection, site not specified: Secondary | ICD-10-CM | POA: Diagnosis not present

## 2013-10-14 DIAGNOSIS — R300 Dysuria: Secondary | ICD-10-CM | POA: Diagnosis not present

## 2013-10-19 DIAGNOSIS — N200 Calculus of kidney: Secondary | ICD-10-CM | POA: Diagnosis not present

## 2013-10-20 DIAGNOSIS — L570 Actinic keratosis: Secondary | ICD-10-CM | POA: Diagnosis not present

## 2013-10-21 ENCOUNTER — Other Ambulatory Visit (INDEPENDENT_AMBULATORY_CARE_PROVIDER_SITE_OTHER): Payer: Medicare Other

## 2013-10-21 DIAGNOSIS — E785 Hyperlipidemia, unspecified: Secondary | ICD-10-CM

## 2013-10-21 DIAGNOSIS — Z0000 Encounter for general adult medical examination without abnormal findings: Secondary | ICD-10-CM | POA: Diagnosis not present

## 2013-10-21 DIAGNOSIS — I10 Essential (primary) hypertension: Secondary | ICD-10-CM

## 2013-10-21 LAB — POCT URINALYSIS DIPSTICK
Bilirubin, UA: NEGATIVE
Blood, UA: NEGATIVE
Glucose, UA: NEGATIVE
KETONES UA: NEGATIVE
Leukocytes, UA: NEGATIVE
Nitrite, UA: NEGATIVE
Protein, UA: NEGATIVE
SPEC GRAV UA: 1.015
Urobilinogen, UA: 0.2
pH, UA: 7

## 2013-10-21 LAB — LIPID PANEL
CHOLESTEROL: 185 mg/dL (ref 0–200)
HDL: 33.4 mg/dL — ABNORMAL LOW (ref >39.00)
LDL CALC: 124 mg/dL — AB (ref 0–99)
TRIGLYCERIDES: 136 mg/dL (ref 0.0–149.0)
Total CHOL/HDL Ratio: 6
VLDL: 27.2 mg/dL (ref 0.0–40.0)

## 2013-10-21 LAB — CBC WITH DIFFERENTIAL/PLATELET
BASOS ABS: 0.1 10*3/uL (ref 0.0–0.1)
Basophils Relative: 0.9 % (ref 0.0–3.0)
EOS ABS: 0.2 10*3/uL (ref 0.0–0.7)
Eosinophils Relative: 3 % (ref 0.0–5.0)
HCT: 46.1 % (ref 39.0–52.0)
Hemoglobin: 15.4 g/dL (ref 13.0–17.0)
LYMPHS PCT: 33.3 % (ref 12.0–46.0)
Lymphs Abs: 2 10*3/uL (ref 0.7–4.0)
MCHC: 33.3 g/dL (ref 30.0–36.0)
MCV: 96.1 fl (ref 78.0–100.0)
MONO ABS: 0.4 10*3/uL (ref 0.1–1.0)
Monocytes Relative: 7 % (ref 3.0–12.0)
NEUTROS PCT: 55.8 % (ref 43.0–77.0)
Neutro Abs: 3.3 10*3/uL (ref 1.4–7.7)
PLATELETS: 226 10*3/uL (ref 150.0–400.0)
RBC: 4.8 Mil/uL (ref 4.22–5.81)
RDW: 13.6 % (ref 11.5–14.6)
WBC: 6 10*3/uL (ref 4.5–10.5)

## 2013-10-21 LAB — BASIC METABOLIC PANEL
BUN: 13 mg/dL (ref 6–23)
CHLORIDE: 102 meq/L (ref 96–112)
CO2: 29 mEq/L (ref 19–32)
Calcium: 9.3 mg/dL (ref 8.4–10.5)
Creatinine, Ser: 0.8 mg/dL (ref 0.4–1.5)
GFR: 96.98 mL/min (ref >60.00)
Glucose, Bld: 74 mg/dL (ref 70–99)
POTASSIUM: 4.5 meq/L (ref 3.5–5.1)
SODIUM: 139 meq/L (ref 135–145)

## 2013-10-21 LAB — HEPATIC FUNCTION PANEL
ALK PHOS: 61 U/L (ref 39–117)
ALT: 13 U/L (ref 0–53)
AST: 18 U/L (ref 0–37)
Albumin: 3.9 g/dL (ref 3.5–5.2)
BILIRUBIN DIRECT: 0.1 mg/dL (ref 0.0–0.3)
Total Bilirubin: 1 mg/dL (ref 0.3–1.2)
Total Protein: 7.4 g/dL (ref 6.0–8.3)

## 2013-10-21 LAB — TSH: TSH: 1.59 u[IU]/mL (ref 0.35–5.50)

## 2013-10-26 ENCOUNTER — Encounter: Payer: Self-pay | Admitting: Family Medicine

## 2013-10-26 ENCOUNTER — Ambulatory Visit (INDEPENDENT_AMBULATORY_CARE_PROVIDER_SITE_OTHER): Payer: Medicare Other | Admitting: Family Medicine

## 2013-10-26 VITALS — BP 120/82 | HR 69 | Temp 98.1°F | Ht 70.0 in | Wt 172.0 lb

## 2013-10-26 DIAGNOSIS — F411 Generalized anxiety disorder: Secondary | ICD-10-CM | POA: Diagnosis not present

## 2013-10-26 DIAGNOSIS — I10 Essential (primary) hypertension: Secondary | ICD-10-CM | POA: Diagnosis not present

## 2013-10-26 DIAGNOSIS — Z0000 Encounter for general adult medical examination without abnormal findings: Secondary | ICD-10-CM | POA: Diagnosis not present

## 2013-10-26 DIAGNOSIS — G252 Other specified forms of tremor: Secondary | ICD-10-CM

## 2013-10-26 DIAGNOSIS — G250 Essential tremor: Secondary | ICD-10-CM

## 2013-10-26 DIAGNOSIS — E785 Hyperlipidemia, unspecified: Secondary | ICD-10-CM | POA: Diagnosis not present

## 2013-10-26 DIAGNOSIS — Z23 Encounter for immunization: Secondary | ICD-10-CM | POA: Diagnosis not present

## 2013-10-26 DIAGNOSIS — F419 Anxiety disorder, unspecified: Secondary | ICD-10-CM

## 2013-10-26 MED ORDER — ALPRAZOLAM 0.5 MG PO TABS: 0.5000 mg | ORAL_TABLET | Freq: Three times a day (TID) | ORAL | Status: DC | PRN

## 2013-10-26 MED ORDER — SERTRALINE HCL 50 MG PO TABS: 50.0000 mg | ORAL_TABLET | Freq: Every day | ORAL | Status: DC

## 2013-10-26 MED ORDER — ATORVASTATIN CALCIUM 20 MG PO TABS: 20.0000 mg | ORAL_TABLET | Freq: Every day | ORAL | Status: DC

## 2013-10-26 MED ORDER — OLMESARTAN MEDOXOMIL 20 MG PO TABS: 20.0000 mg | ORAL_TABLET | Freq: Every day | ORAL | Status: DC

## 2013-10-26 NOTE — Patient Instructions (Signed)
Continue weight control efforts Start back Lipitor 20 mg once daily We will call you regarding neurology appointment

## 2013-10-26 NOTE — Progress Notes (Signed)
Subjective:    Patient ID: Micheal [name], male    DOB: 05-Dec-1945, 68 y.o.   MRN: [id number]  HPI   Patient here for Medicare wellness exam and medical followup He has history of hypertension, hyperlipidemia, chronic anxiety, essential tremor, prostate cancer. He is followed regularly by urology. Had recent kidney stone which represented his second stone. This is currently under analysis. Also followed regularly by dermatology with history of actinic keratoses.  His upper extremity tremor has worsened over time. He would like to consider consultation with neurologist. His tremor is worse with intention. He has never tried beta blockers oriented medications.  In reviewing immunizations he had 23 valent pneumococcal vaccine but this was about 6 or 7 years ago. Needs Prevnar 13. Other immunizations up to date. Needs tetanus booster next year. Last colonoscopy 2006 with recommended ten year followup  He takes Benicar for hypertension. Blood pressures been well controlled. Has lost some weight due to dietary changes of the past year. Denies any chest pains.  Past Medical History  Diagnosis Date  . Arthritis   . Hyperlipidemia   . Hypertension   . Kidney stone   . Urinary incontinence   . Cancer     prostate   Past Surgical History  Procedure Laterality Date  . Prostatectomy    . Lumbar disc surgery  2004  . Knee arthroscopy  2010    bilateral  . Total knee arthroplasty  2009    left knee    reports that he has never smoked. He has never used smokeless tobacco. He reports that he drinks alcohol. He reports that he does not use illicit drugs. family history includes Aneurysm in his father; Heart attack in an other family member; Sudden death in his mother. No Known Allergies  1.  Risk factors based on Past Medical , Social, and Family history reviewed and as above 2.  Limitations in physical activities no formal exercise. No recent falls 3.  Depression/mood has some chronic  anxiety. No depression. 4.  Hearing no major changes 5.  ADLs independent in all 6.  Cognitive function (orientation to time and place, language, writing, speech,memory) memory intact. Language and judgment intact 7.  Home Safety no issues identified 8.  Height, weight, and visual acuity. He's lost some weight during the past year due to his efforts and feels better overall. No recent visual changes 9.  Counseling discuss weight maintenance 10. Recommendation of preventive services. Prevnar 13 given 11. Labs based on risk factors recent labs were reviewed. 12. Care Plan add back Lipitor 20 mg once daily. Prevnar 13 given. Set up neurology assessment regarding his essential tremor      Review of Systems  Constitutional: Negative for fever, activity change, appetite change and fatigue.  HENT: Negative for congestion, ear pain and trouble swallowing.   Eyes: Negative for pain and visual disturbance.  Respiratory: Negative for cough, shortness of breath and wheezing.   Cardiovascular: Negative for chest pain and palpitations.  Gastrointestinal: Negative for nausea, vomiting, abdominal pain, diarrhea, constipation, blood in stool, abdominal distention and rectal pain.  Genitourinary: Negative for dysuria, hematuria and testicular pain.  Musculoskeletal: Negative for arthralgias and joint swelling.  Skin: Negative for rash.  Neurological: Negative for dizziness, syncope and headaches.  Hematological: Negative for adenopathy.  Psychiatric/Behavioral: Negative for confusion and dysphoric mood.       Objective:   Physical Exam  Constitutional: He is oriented to person, place, and time. He appears well-developed and well-nourished.  No distress.  HENT:  Head: Normocephalic and atraumatic.  Right Ear: External ear normal.  Left Ear: External ear normal.  Mouth/Throat: Oropharynx is clear and moist.  Eyes: Conjunctivae and EOM are normal. Pupils are equal, round, and reactive to light.    Neck: Normal range of motion. Neck supple. No thyromegaly present.  Cardiovascular: Normal rate, regular rhythm and normal heart sounds.   No murmur heard. Pulmonary/Chest: No respiratory distress. He has no wheezes. He has no rales.  Abdominal: Soft. Bowel sounds are normal. He exhibits no distension and no mass. There is no tenderness. There is no rebound and no guarding.  Genitourinary:  Per urology  Musculoskeletal: He exhibits no edema.  Lymphadenopathy:    He has no cervical adenopathy.  Neurological: He is alert and oriented to person, place, and time. He displays normal reflexes. No cranial nerve deficit.  Upper extremity tremor at rest which is exacerbated with intention No bradykinesia. No cogwheel rigidity.  Skin: No rash noted.  Patient has some diffuse erythema involving his face from recent chemical treatment for actinic keratosis - per dermatology  Psychiatric: He has a normal mood and affect.          Assessment & Plan:  #1 complete physical. Prevnar 13 given. Other immunizations up to date. We'll need tetanus booster and colonoscopy by next year #2 hyperlipidemia. 18% 10 year risk of CAD event. Add back Lipitor 20 mg once daily #3 hypertension. Stable at goal. Refill Benicar for one year #4 History of chronic anxiety. Refill sertraline for one year.  Limited alprazolam to use for upcoming flight #5 essential tremor. Progressive. Patient requesting neurology referral for further evaluation. Will setup

## 2013-10-26 NOTE — Progress Notes (Signed)
Pre visit review using our clinic review tool, if applicable. No additional management support is needed unless otherwise documented below in the visit note. 

## 2013-10-27 ENCOUNTER — Telehealth: Payer: Self-pay | Admitting: Family Medicine

## 2013-10-27 NOTE — Telephone Encounter (Signed)
Relevant patient education assigned to patient using Emmi. ° °

## 2013-11-09 ENCOUNTER — Ambulatory Visit: Payer: Medicare Other | Admitting: Neurology

## 2013-11-11 ENCOUNTER — Ambulatory Visit (INDEPENDENT_AMBULATORY_CARE_PROVIDER_SITE_OTHER): Payer: Medicare Other | Admitting: Neurology

## 2013-11-11 ENCOUNTER — Encounter: Payer: Self-pay | Admitting: Neurology

## 2013-11-11 VITALS — BP 100/72 | HR 80 | Resp 14 | Ht 70.0 in | Wt 173.4 lb

## 2013-11-11 DIAGNOSIS — G609 Hereditary and idiopathic neuropathy, unspecified: Secondary | ICD-10-CM

## 2013-11-11 DIAGNOSIS — G629 Polyneuropathy, unspecified: Secondary | ICD-10-CM | POA: Insufficient documentation

## 2013-11-11 DIAGNOSIS — G250 Essential tremor: Secondary | ICD-10-CM | POA: Diagnosis not present

## 2013-11-11 DIAGNOSIS — G252 Other specified forms of tremor: Secondary | ICD-10-CM

## 2013-11-11 LAB — VITAMIN B12: Vitamin B-12: 359 pg/mL (ref 211–911)

## 2013-11-11 LAB — FOLATE: Folate: 7.1 ng/mL

## 2013-11-11 NOTE — Progress Notes (Signed)
Subjective:    Micheal [name] was seen in consultation in the movement disorder clinic at the request of [name], MD.  The evaluation is for tremor.  The patient is a 68 y.o. right handed male with a history of tremor.   Tremor has been going on for several years.  It involves both hands.  It is progressively getting worse.   He notices it most with using the hands, especially if trying to show someone something.  He will note it when his hands are on the car wheel when he is driving.  He notices it with fine motor movement.  He sometimes notes it at rest.  He feels a tremor on the inside.  There is no family hx of tremor.    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  No Known Allergies  Current Outpatient Prescriptions on File Prior to Visit  Medication Sig Dispense Refill  . ALPRAZolam (XANAX) 0.5 MG tablet Take 1 tablet (0.5 mg total) by mouth every 8 (eight) hours as needed for anxiety. 1/2 to 1 tab by mouth as needed for anxiety  30 tablet  0  . atorvastatin (LIPITOR) 20 MG tablet Take 1 tablet (20 mg total) by mouth daily.  90 tablet  3  . olmesartan (BENICAR) 20 MG tablet Take 1 tablet (20 mg total) by mouth daily.  90 tablet  3  . sertraline (ZOLOFT) 50 MG tablet Take 1 tablet (50 mg total) by mouth daily.  90  tablet  3   No current facility-administered medications on file prior to visit.    Past Medical History  Diagnosis Date  . Arthritis   . Hyperlipidemia   . Hypertension   . Kidney stone   . Urinary incontinence   . Cancer     prostate  . Osteoarthritis   . OSA (obstructive sleep apnea)   . Anxiety   . Tremor     Past Surgical History  Procedure Laterality Date  . Prostatectomy    . Lumbar disc surgery  2004  . Knee arthroscopy  2010    bilateral  . Total knee arthroplasty  2009    left knee    History   Social History  . Marital Status: Married    Spouse Name: N/A    Number of Children: N/A  . Years of Education: N/A   Occupational History  . Not on file.   Social History Main Topics  . Smoking status: Never Smoker   . Smokeless tobacco: Never Used  . Alcohol Use: Yes  . Drug Use: No  . Sexual Activity: Not on file   Other Topics Concern  . Not on file   Social History Narrative  . No narrative on file    Family Status  Relation Status Death Age  . Mother  Deceased     pancreatitis complications  . Father Deceased     heart failure  . Child Alive     x 2 - healthy  . Child Deceased at birth    Review of Systems C/o balance trouble.  Was an avid athlete and great tennis player but has trouble with balance now and has had a few falls.  Has tripped over throw rugs in the home.  A complete 10 system ROS was obtained and was negative apart from what is mentioned.   Objective:   VITALS:   Filed Vitals:   11/11/13 0905  BP: 100/72  Pulse: 80  Resp: 14  Height: 5\' 10"  (1.778 m)  Weight: 173 lb 7 oz (78.671 kg)   Gen:  Appears stated age and in NAD. HEENT:  Normocephalic, atraumatic. The mucous membranes are moist. The superficial temporal arteries are without ropiness or tenderness. Cardiovascular: Regular rate and rhythm. Lungs: Clear to auscultation bilaterally. Neck: There are no carotid bruits noted  bilaterally.  NEUROLOGICAL:  Orientation:  The patient is alert and oriented x 3.  Recent and remote memory are intact.  Attention span and concentration are normal.  Able to name objects and repeat without trouble.  Fund of knowledge is appropriate Cranial nerves: There is good facial symmetry. The pupils are equal round and reactive to light bilaterally. Fundoscopic exam reveals clear disc margins bilaterally. Extraocular muscles are intact and visual fields are full to confrontational testing. Speech is fluent and clear. Soft palate rises symmetrically and there is no tongue deviation. Hearing is intact to conversational tone. Tone: Tone is good throughout. Sensation: Sensation is intact to light touch. Vibration is completely absent in the LE. There is no extinction with double simultaneous stimulation. There is no sensory dermatomal level identified. Coordination:  The patient has no dysdiadichokinesia or dysmetria. Motor: Strength is 5/5 in the bilateral upper and lower extremities.  Shoulder shrug is equal bilaterally.  There is no pronator drift.  There are no fasciculations noted. DTR's: Deep tendon reflexes are 2/4 at the bilateral biceps, triceps, brachioradialis, patella and trace at the bilateral achilles.  Plantar responses are downgoing bilaterally. Gait and Station: The patient is able to ambulate without difficulty. The patient is able to heel toe walk without any difficulty. The patient is able to ambulate in a tandem fashion. The patient is able to stand in the Romberg position.  Gait is slightly wide based.  MOVEMENT EXAM: Tremor:  There is mild tremor in the UE, noted most significantly with action.  The R is worse than the L.   The patient is able to draw Archimedes spirals without significant difficulty.  There is no tremor at rest.  The patient is able to pour water from one glass to another without spilling it.  Minimal tremor is noted on the R.    LABS  Lab Results   Component Value Date   TSH 1.59 10/21/2013   No results found for this basename: VITAMINB12     Chemistry      Component Value Date/Time   NA 139 10/21/2013 0922   K 4.5 10/21/2013 0922   CL 102 10/21/2013 0922   CO2 29 10/21/2013 0922   BUN 13 10/21/2013 0922   CREATININE 0.8 10/21/2013 0922      Component Value Date/Time   CALCIUM 9.3 10/21/2013 0922   ALKPHOS 61 10/21/2013 0922   AST 18 10/21/2013 0922   ALT 13 10/21/2013 0922   BILITOT 1.0 10/21/2013 0737  No results found for this basename: FOLATE   No results found for this basename: HGBA1C        Assessment/Plan:   1.  Essential Tremor.  - It is overall very mild.  He doesn't want treatment.  I don't disagree.  He will let me know if he changes his mind in the future.  Pt education was provided.  No evidence of a neurodegenerative condition like PD (pts family was worried about this). 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  Safety discussed.  Pt education provided.  Likely etiology of why pts balance not as good as in the past.  Will check some labs.  Pt to call for the results of the labs.  Get rid of throw rugs/use night lights/wear hard soled shoes at all times. 3.  F/u prn.

## 2013-11-11 NOTE — Patient Instructions (Signed)
1. Your provider has requested that you have labwork completed today. Please go to [facility] on the first floor of this building before leaving the office today. 2. We will call you with any abnormal lab results.  3. Follow up as needed.

## 2013-11-12 LAB — RPR

## 2013-11-16 LAB — UIFE/LIGHT CHAINS/TP QN, 24-HR UR
Albumin, U: DETECTED
FREE LAMBDA LT CHAINS, UR: 0.03 mg/dL (ref 0.02–0.67)
Free Kappa Lt Chains,Ur: 0.25 mg/dL (ref 0.14–2.42)
Free Kappa/Lambda Ratio: 8.33 ratio (ref 2.04–10.37)
Total Protein, Urine: 0.8 mg/dL

## 2013-11-16 LAB — SPEP & IFE WITH QIG
ALBUMIN ELP: 57 % (ref 55.8–66.1)
Alpha-1-Globulin: 3.4 % (ref 2.9–4.9)
Alpha-2-Globulin: 8.4 % (ref 7.1–11.8)
BETA 2: 6.5 % (ref 3.2–6.5)
Beta Globulin: 6.4 % (ref 4.7–7.2)
Gamma Globulin: 18.3 % (ref 11.1–18.8)
IGM, SERUM: 59 mg/dL (ref 41–251)
IgA: 581 mg/dL — ABNORMAL HIGH (ref 68–379)
IgG (Immunoglobin G), Serum: 1440 mg/dL (ref 650–1600)
TOTAL PROTEIN, SERUM ELECTROPHOR: 8 g/dL (ref 6.0–8.3)

## 2013-11-17 ENCOUNTER — Telehealth: Payer: Self-pay | Admitting: Neurology

## 2013-11-17 ENCOUNTER — Other Ambulatory Visit: Payer: Self-pay | Admitting: Dermatology

## 2013-11-17 DIAGNOSIS — D485 Neoplasm of uncertain behavior of skin: Secondary | ICD-10-CM | POA: Diagnosis not present

## 2013-11-17 DIAGNOSIS — L820 Inflamed seborrheic keratosis: Secondary | ICD-10-CM | POA: Diagnosis not present

## 2013-11-17 NOTE — Telephone Encounter (Signed)
Patient made aware labs okay. He will call with any other questions.

## 2013-11-17 NOTE — Telephone Encounter (Signed)
Left message on machine for patient to call back.

## 2013-11-17 NOTE — Telephone Encounter (Signed)
SPEP abnormal. Please advise if okay to call.

## 2013-11-17 NOTE — Telephone Encounter (Signed)
It was all unremarkable.

## 2013-11-17 NOTE — Telephone Encounter (Signed)
Pt called for lab results. Please call [phone] / Sherri S.

## 2014-04-21 ENCOUNTER — Other Ambulatory Visit: Payer: Self-pay | Admitting: Dermatology

## 2014-04-21 DIAGNOSIS — L821 Other seborrheic keratosis: Secondary | ICD-10-CM | POA: Diagnosis not present

## 2014-04-21 DIAGNOSIS — C44711 Basal cell carcinoma of skin of unspecified lower limb, including hip: Secondary | ICD-10-CM | POA: Diagnosis not present

## 2014-04-21 DIAGNOSIS — L723 Sebaceous cyst: Secondary | ICD-10-CM | POA: Diagnosis not present

## 2014-04-23 DIAGNOSIS — C4491 Basal cell carcinoma of skin, unspecified: Secondary | ICD-10-CM

## 2014-04-23 HISTORY — DX: Basal cell carcinoma of skin, unspecified: C44.91

## 2014-05-14 DIAGNOSIS — C61 Malignant neoplasm of prostate: Secondary | ICD-10-CM | POA: Diagnosis not present

## 2014-05-21 DIAGNOSIS — C61 Malignant neoplasm of prostate: Secondary | ICD-10-CM | POA: Diagnosis not present

## 2014-05-21 DIAGNOSIS — N529 Male erectile dysfunction, unspecified: Secondary | ICD-10-CM | POA: Diagnosis not present

## 2014-05-21 DIAGNOSIS — N200 Calculus of kidney: Secondary | ICD-10-CM | POA: Diagnosis not present

## 2014-05-26 DIAGNOSIS — K5901 Slow transit constipation: Secondary | ICD-10-CM | POA: Diagnosis not present

## 2014-05-26 DIAGNOSIS — R142 Eructation: Secondary | ICD-10-CM | POA: Diagnosis not present

## 2014-05-26 DIAGNOSIS — R198 Other specified symptoms and signs involving the digestive system and abdomen: Secondary | ICD-10-CM | POA: Diagnosis not present

## 2014-05-26 DIAGNOSIS — R141 Gas pain: Secondary | ICD-10-CM | POA: Diagnosis not present

## 2014-06-21 ENCOUNTER — Other Ambulatory Visit: Payer: Self-pay | Admitting: Gastroenterology

## 2014-06-21 DIAGNOSIS — D123 Benign neoplasm of transverse colon: Secondary | ICD-10-CM | POA: Diagnosis not present

## 2014-06-21 DIAGNOSIS — D122 Benign neoplasm of ascending colon: Secondary | ICD-10-CM | POA: Diagnosis not present

## 2014-06-21 DIAGNOSIS — R194 Change in bowel habit: Secondary | ICD-10-CM | POA: Diagnosis not present

## 2014-06-21 DIAGNOSIS — D120 Benign neoplasm of cecum: Secondary | ICD-10-CM | POA: Diagnosis not present

## 2014-06-21 HISTORY — PX: COLONOSCOPY W/ POLYPECTOMY: SHX1380

## 2014-06-22 DIAGNOSIS — Z23 Encounter for immunization: Secondary | ICD-10-CM | POA: Diagnosis not present

## 2014-07-01 ENCOUNTER — Other Ambulatory Visit: Payer: Self-pay | Admitting: Dermatology

## 2014-07-01 DIAGNOSIS — L570 Actinic keratosis: Secondary | ICD-10-CM | POA: Diagnosis not present

## 2014-07-01 DIAGNOSIS — D485 Neoplasm of uncertain behavior of skin: Secondary | ICD-10-CM | POA: Diagnosis not present

## 2014-07-01 DIAGNOSIS — L821 Other seborrheic keratosis: Secondary | ICD-10-CM | POA: Diagnosis not present

## 2014-07-01 DIAGNOSIS — C44711 Basal cell carcinoma of skin of unspecified lower limb, including hip: Secondary | ICD-10-CM | POA: Diagnosis not present

## 2014-07-14 ENCOUNTER — Encounter: Payer: Self-pay | Admitting: Family Medicine

## 2014-07-30 ENCOUNTER — Emergency Department (INDEPENDENT_AMBULATORY_CARE_PROVIDER_SITE_OTHER)
Admission: EM | Admit: 2014-07-30 | Discharge: 2014-07-30 | Disposition: A | Discharge status: Home / Self Care | Payer: Medicare Other | Source: Home / Self Care | Attending: Emergency Medicine | Admitting: Emergency Medicine

## 2014-07-30 ENCOUNTER — Encounter (HOSPITAL_COMMUNITY): Payer: Self-pay | Admitting: Emergency Medicine

## 2014-07-30 ENCOUNTER — Emergency Department (HOSPITAL_COMMUNITY)
Admission: EM | Admit: 2014-07-30 | Discharge: 2014-07-30 | Disposition: A | Discharge status: Home / Self Care | Payer: Medicare Other | Attending: Emergency Medicine | Admitting: Emergency Medicine

## 2014-07-30 ENCOUNTER — Telehealth: Payer: Self-pay | Admitting: Family Medicine

## 2014-07-30 ENCOUNTER — Encounter (HOSPITAL_COMMUNITY): Payer: Self-pay | Admitting: Family Medicine

## 2014-07-30 ENCOUNTER — Emergency Department (HOSPITAL_COMMUNITY): Payer: Medicare Other

## 2014-07-30 DIAGNOSIS — Z8739 Personal history of other diseases of the musculoskeletal system and connective tissue: Secondary | ICD-10-CM | POA: Diagnosis not present

## 2014-07-30 DIAGNOSIS — R0789 Other chest pain: Secondary | ICD-10-CM

## 2014-07-30 DIAGNOSIS — Z87442 Personal history of urinary calculi: Secondary | ICD-10-CM | POA: Insufficient documentation

## 2014-07-30 DIAGNOSIS — I10 Essential (primary) hypertension: Secondary | ICD-10-CM | POA: Insufficient documentation

## 2014-07-30 DIAGNOSIS — F419 Anxiety disorder, unspecified: Secondary | ICD-10-CM | POA: Diagnosis not present

## 2014-07-30 DIAGNOSIS — Z8546 Personal history of malignant neoplasm of prostate: Secondary | ICD-10-CM | POA: Insufficient documentation

## 2014-07-30 DIAGNOSIS — Z79899 Other long term (current) drug therapy: Secondary | ICD-10-CM | POA: Diagnosis not present

## 2014-07-30 DIAGNOSIS — R918 Other nonspecific abnormal finding of lung field: Secondary | ICD-10-CM | POA: Diagnosis not present

## 2014-07-30 DIAGNOSIS — R079 Chest pain, unspecified: Secondary | ICD-10-CM | POA: Diagnosis not present

## 2014-07-30 DIAGNOSIS — R072 Precordial pain: Secondary | ICD-10-CM | POA: Diagnosis not present

## 2014-07-30 DIAGNOSIS — Z8669 Personal history of other diseases of the nervous system and sense organs: Secondary | ICD-10-CM | POA: Diagnosis not present

## 2014-07-30 DIAGNOSIS — E785 Hyperlipidemia, unspecified: Secondary | ICD-10-CM | POA: Insufficient documentation

## 2014-07-30 LAB — BASIC METABOLIC PANEL
Anion gap: 13 (ref 5–15)
BUN: 16 mg/dL (ref 6–23)
CHLORIDE: 100 meq/L (ref 96–112)
CO2: 28 mEq/L (ref 19–32)
Calcium: 9.4 mg/dL (ref 8.4–10.5)
Creatinine, Ser: 0.88 mg/dL (ref 0.50–1.35)
GFR, EST NON AFRICAN AMERICAN: 87 mL/min — AB (ref >90)
GLUCOSE: 87 mg/dL (ref 70–99)
POTASSIUM: 4 meq/L (ref 3.7–5.3)
SODIUM: 141 meq/L (ref 137–147)

## 2014-07-30 LAB — CBC
HCT: 41.4 % (ref 39.0–52.0)
HEMOGLOBIN: 14.7 g/dL (ref 13.0–17.0)
MCH: 32 pg (ref 26.0–34.0)
MCHC: 35.5 g/dL (ref 30.0–36.0)
MCV: 90 fL (ref 78.0–100.0)
Platelets: 228 10*3/uL (ref 150–400)
RBC: 4.6 MIL/uL (ref 4.22–5.81)
RDW: 13 % (ref 11.5–15.5)
WBC: 7.6 10*3/uL (ref 4.0–10.5)

## 2014-07-30 LAB — I-STAT TROPONIN, ED
Troponin i, poc: 0 ng/mL (ref 0.00–0.08)
Troponin i, poc: 0.02 ng/mL (ref 0.00–0.08)

## 2014-07-30 LAB — HEPATIC FUNCTION PANEL
ALT: 15 U/L (ref 0–53)
AST: 24 U/L (ref 0–37)
Albumin: 4.1 g/dL (ref 3.5–5.2)
Alkaline Phosphatase: 77 U/L (ref 39–117)
Bilirubin, Direct: <0.2 mg/dL (ref 0.0–0.3)
TOTAL PROTEIN: 7.8 g/dL (ref 6.0–8.3)
Total Bilirubin: 0.5 mg/dL (ref 0.3–1.2)

## 2014-07-30 LAB — LIPASE, BLOOD: LIPASE: 26 U/L (ref 11–59)

## 2014-07-30 NOTE — Discharge Instructions (Signed)
Please follow up with your primary care physician in 1-2 days. If you do not have one please call the [place] number listed above. Please follow up with your cardiologist or the cardiologist above to schedule a follow up appointment. Please return for any returned chest pain. Please read all discharge instructions and return precautions.   Chest Pain (Nonspecific) It is often hard to give a specific diagnosis for the cause of chest pain. There is always a chance that your pain could be related to something serious, such as a heart attack or a blood clot in the lungs. You need to follow up with your health care provider for further evaluation. CAUSES   Heartburn.  Pneumonia or bronchitis.  Anxiety or stress.  Inflammation around your heart (pericarditis) or lung (pleuritis or pleurisy).  A blood clot in the lung.  A collapsed lung (pneumothorax). It can develop suddenly on its own (spontaneous pneumothorax) or from trauma to the chest.  Shingles infection (herpes zoster virus). The chest wall is composed of bones, muscles, and cartilage. Any of these can be the source of the pain.  The bones can be bruised by injury.  The muscles or cartilage can be strained by coughing or overwork.  The cartilage can be affected by inflammation and become sore (costochondritis). DIAGNOSIS  Lab tests or other studies may be needed to find the cause of your pain. Your health care provider may have you take a test called an ambulatory electrocardiogram (ECG). An ECG records your heartbeat patterns over a 24-hour period. You may also have other tests, such as:  Transthoracic echocardiogram (TTE). During echocardiography, sound waves are used to evaluate how blood flows through your heart.  Transesophageal echocardiogram (TEE).  Cardiac monitoring. This allows your health care provider to monitor your heart rate and rhythm in real time.  Holter monitor. This is a portable device  that records your heartbeat and can help diagnose heart arrhythmias. It allows your health care provider to track your heart activity for several days, if needed.  Stress tests by exercise or by giving medicine that makes the heart beat faster. TREATMENT   Treatment depends on what may be causing your chest pain. Treatment may include:  Acid blockers for heartburn.  Anti-inflammatory medicine.  Pain medicine for inflammatory conditions.  Antibiotics if an infection is present.  You may be advised to change lifestyle habits. This includes stopping smoking and avoiding alcohol, caffeine, and chocolate.  You may be advised to keep your head raised (elevated) when sleeping. This reduces the chance of acid going backward from your stomach into your esophagus. Most of the time, nonspecific chest pain will improve within 2-3 days with rest and mild pain medicine.  HOME CARE INSTRUCTIONS   If antibiotics were prescribed, take them as directed. Finish them even if you start to feel better.  For the next few days, avoid physical activities that bring on chest pain. Continue physical activities as directed.  Do not use any tobacco products, including cigarettes, chewing tobacco, or electronic cigarettes.  Avoid drinking alcohol.  Only take medicine as directed by your health care provider.  Follow your health care provider's suggestions for further testing if your chest pain does not go away.  Keep any follow-up appointments you made. If you do not go to an appointment, you could develop lasting (chronic) problems with pain. If there is any problem keeping an appointment, call to reschedule. SEEK MEDICAL CARE IF:   Your chest pain  does not go away, even after treatment.  You have a rash with blisters on your chest.  You have a fever. SEEK IMMEDIATE MEDICAL CARE IF:   You have increased chest pain or pain that spreads to your arm, neck, jaw, back, or abdomen.  You have shortness of  breath.  You have an increasing cough, or you cough up blood.  You have severe back or abdominal pain.  You feel nauseous or vomit.  You have severe weakness.  You faint.  You have chills. This is an emergency. Do not wait to see if the pain will go away. Get medical help at once. Call your local emergency services (911 in [place] Do not drive yourself to the hospital. MAKE SURE YOU:   Understand these instructions.  Will watch your condition.  Will get help right away if you are not doing well or get worse. Document Released: 06/13/2005 Document Revised: 09/08/2013 Document Reviewed: 04/08/2008 [facility] Patient Information 2015 [place], [place]. This information is not intended to replace advice given to you by your health care provider. Make sure you discuss any questions you have with your health care provider.

## 2014-07-30 NOTE — ED Notes (Signed)
68 year old male with  Chest pain.  He states that he called his gastro enterologist this am after he experineced a period of 2 hours last evening of what he called heartburn  This occurred from 10pm -12 am.  He  Took some Pepcid without any relief.  Has had 2 episodes like this within the week.  Hade a stress test over 5 years ago and he states that it was good.

## 2014-07-30 NOTE — Telephone Encounter (Signed)
noted 

## 2014-07-30 NOTE — Telephone Encounter (Signed)
Patient Information:  Caller Name: Tupac  Phone: [phone]  Patient: Shanti, Eichel  Gender: Male  DOB: 03-27-1946  Age: 68 Years  PCP: Carolann Littler [facility])  Office Follow Up:  Does the office need to follow up with this patient?: No  Instructions For The Office: N/A  RN Note:  It is now 16:50. Pt will go to [facility] to be seen.  Symptoms  Reason For Call & Symptoms: Pt called about heartburn. He has had 2 episodes in the past 2 weeks. Same type of event. It always happens at night. Last night he had chili at 7pm . Then he woke up at 10pm with heartburn - burning up his breastbone. He does burp at the time and sitting up helps. He called his gastroenterologist who prescribed an antacid - Pantoprazole qam and Zegrid before going to bed. He wanted the pt to call and make sure that this was not chest pain./to r/o heart attacks. Today he has a burning feeling in his throat/esophagus/but also a pressure in his chest/breastbone.  Reviewed Health History In EMR: Yes  Reviewed Medications In EMR: Yes  Reviewed Allergies In EMR: Yes  Reviewed Surgeries / Procedures: Yes  Date of Onset of Symptoms: 07/22/2014  Guideline(s) Used:  Chest Pain  Disposition Per Guideline:   See Today in Office  Reason For Disposition Reached:   All other patients with chest pain  Advice Given:  N/A  Patient Will Follow Care Advice:  YES

## 2014-07-30 NOTE — ED Notes (Signed)
Pt sent to ED from [facility] for chest pain. PT reports had "heartburn" last night at 2300 that has persisted into today. Pt describes pain as central, burning/pressure sensation. PT ambulatory, A&Ox4, and in NAD.

## 2014-07-30 NOTE — ED Provider Notes (Signed)
CSN: [id number]     Arrival date & time 07/30/14  1727 History   First MD Initiated Contact with Patient 07/30/14 1748     Chief Complaint  Patient presents with  . Chest Pain   (Consider location/radiation/quality/duration/timing/severity/associated sxs/prior Treatment) HPI He is a 68 year old man here for evaluation of chest discomfort. He states he had substernal chest tightness that went up and down the front of his chest last night. It lasted about 2 hours. He denies any associated shortness of breath, diaphoresis, nausea, dizziness. He has had similar episodes 2-3 times in the last month. With each episode he has taken an antacid with minimal to no improvement. Last night, he called his GI doctor who recommended evaluation for cardiac etiology. Currently, he reports a slight epigastric discomfort. He also states his stomach is a little off.  He does have a personal history of hypertension and hyperlipidemia. He has a positive family history for cardiac disease. He does not smoke.  Past Medical History  Diagnosis Date  . Arthritis   . Hyperlipidemia   . Hypertension   . Kidney stone   . Urinary incontinence   . Cancer     prostate  . Osteoarthritis   . OSA (obstructive sleep apnea)   . Anxiety   . Tremor    Past Surgical History  Procedure Laterality Date  . Prostatectomy    . Lumbar disc surgery  2004  . Knee arthroscopy  2010    bilateral  . Total knee arthroplasty  2009    left knee   Family History  Problem Relation Age of Onset  . Sudden death Mother   . Aneurysm Father   . Heart attack      grandfather   History  Substance Use Topics  . Smoking status: Never Smoker   . Smokeless tobacco: Never Used  . Alcohol Use: Yes    Review of Systems  Constitutional: Negative for fever, chills and diaphoresis.  Respiratory: Positive for chest tightness. Negative for shortness of breath.   Cardiovascular: Positive for chest pain. Negative for palpitations.   Gastrointestinal: Positive for nausea.  Neurological: Negative for dizziness.    Allergies  Review of patient's allergies indicates no known allergies.  Home Medications   Prior to Admission medications   Medication Sig Start Date End Date Taking? Authorizing Provider  ALPRAZolam Duanne Moron) 0.5 MG tablet Take 1 tablet (0.5 mg total) by mouth every 8 (eight) hours as needed for anxiety. 1/2 to 1 tab by mouth as needed for anxiety 10/26/13   [name], MD  atorvastatin (LIPITOR) 20 MG tablet Take 1 tablet (20 mg total) by mouth daily. 10/26/13   [name], MD  olmesartan (BENICAR) 20 MG tablet Take 1 tablet (20 mg total) by mouth daily. 10/26/13   [name], MD  sertraline (ZOLOFT) 50 MG tablet Take 1 tablet (50 mg total) by mouth daily. 10/26/13   [name], MD   BP 160/101 mmHg  Pulse 64  Temp(Src) 98.2 F (36.8 C) (Oral)  SpO2 99% Physical Exam  Constitutional: He is oriented to person, place, and time. He appears well-developed and well-nourished. No distress.  Cardiovascular: Normal rate, regular rhythm and normal heart sounds.   No murmur heard. Pulmonary/Chest: Effort normal and breath sounds normal. No respiratory distress. He has no wheezes. He has no rales. He exhibits no tenderness.  Abdominal: Soft. Bowel sounds are normal. He exhibits no distension. There is no tenderness. There is no rebound and  no guarding.  Neurological: He is alert and oriented to person, place, and time.    ED Course  EKG  Date/Time: 07/30/2014 6:18 PM Performed by: Melony Overly Authorized by: Melony Overly Interpreted by ED physician Previous ECG: no previous ECG available Rhythm: sinus rhythm Rate: normal QRS axis: normal Conduction: conduction normal ST Segments: ST segments normal T Waves: T waves normal Clinical impression: normal ECG   (including critical care time) Labs Review Labs Reviewed - No data to display  Imaging Review No results found.   MDM    1. Atypical chest pain    His story does have some typical components; however, his EKG is normal and this is likely reflux. Given his age and positive family history, I recommended transfer to the emergency room by EMS to complete cardiac evaluation with troponins. He declined transfer to the emergency room by EMS, but states he will go by private vehicle after discharge. He will follow-up with his GI doctor next Friday as scheduled.    Melony Overly, MD 07/30/14 [phone]

## 2014-07-30 NOTE — ED Provider Notes (Signed)
CSN: [id number]     Arrival date & time 07/30/14  1853 History   First MD Initiated Contact with Patient 07/30/14 2148     Chief Complaint  Patient presents with  . Chest Pain     (Consider location/radiation/quality/duration/timing/severity/associated sxs/prior Treatment) HPI Comments: Patient is a 68 year old male past medical history significant for hyperlipidemia, hypertension presenting to the emergency department from [facility] for evaluation of his chest discomfort. Patient states he awoke last evening around 10PM with substernal chest pressure without radiation. He denies any associated SOB, diaphoresis, N/V, syncope. He states the severe pain last approximately 2 hours and he had only mild burning discomfort until 5PM this evening. He states the pain was worse laying flat and not changed with exertion such as walking, or taking the stairs. He was seen by his GI doctor who was concerned about cardiac etiology, sent to [facility] then transferred here. Last stress test 6 years ago. Patient does have a cardiologist.   Patient is a 68 y.o. male presenting with chest pain.  Chest Pain   Past Medical History  Diagnosis Date  . Arthritis   . Hyperlipidemia   . Hypertension   . Kidney stone   . Urinary incontinence   . Cancer     prostate  . Osteoarthritis   . OSA (obstructive sleep apnea)   . Anxiety   . Tremor    Past Surgical History  Procedure Laterality Date  . Prostatectomy    . Lumbar disc surgery  2004  . Knee arthroscopy  2010    bilateral  . Total knee arthroplasty  2009    left knee   Family History  Problem Relation Age of Onset  . Sudden death Mother   . Aneurysm Father   . Heart attack      grandfather   History  Substance Use Topics  . Smoking status: Never Smoker   . Smokeless tobacco: Never Used  . Alcohol Use: Yes    Review of Systems  Cardiovascular: Positive for chest pain.  All other systems reviewed and are negative.     Allergies  Review of  patient's allergies indicates no known allergies.  Home Medications   Prior to Admission medications   Medication Sig Start Date End Date Taking? Authorizing Provider  ALPRAZolam Duanne Moron) 0.5 MG tablet Take 1 tablet (0.5 mg total) by mouth every 8 (eight) hours as needed for anxiety. 1/2 to 1 tab by mouth as needed for anxiety 10/26/13  Yes [name], MD  atorvastatin (LIPITOR) 20 MG tablet Take 1 tablet (20 mg total) by mouth daily. 10/26/13  Yes [name], MD  olmesartan (BENICAR) 20 MG tablet Take 1 tablet (20 mg total) by mouth daily. 10/26/13  Yes [name], MD  sertraline (ZOLOFT) 50 MG tablet Take 1 tablet (50 mg total) by mouth daily. 10/26/13  Yes [name], MD  pantoprazole (PROTONIX) 40 MG tablet Take 40 mg by mouth daily. 07/30/14   Historical Provider, MD   BP 138/88 mmHg  Pulse 56  Temp(Src) 98.4 F (36.9 C) (Oral)  Resp 10  SpO2 96% Physical Exam  Constitutional: He is oriented to person, place, and time. He appears well-developed and well-nourished. No distress.  HENT:  Head: Normocephalic and atraumatic.  Right Ear: External ear normal.  Left Ear: External ear normal.  Nose: Nose normal.  Mouth/Throat: Oropharynx is clear and moist. No oropharyngeal exudate.  Eyes: Conjunctivae are normal.  Neck: Normal range of motion. Neck supple.  Cardiovascular: Normal rate, regular rhythm, normal heart sounds and intact distal pulses.   Pulmonary/Chest: Effort normal and breath sounds normal. No respiratory distress. He exhibits no tenderness.  Abdominal: Soft. Bowel sounds are normal. There is no tenderness.  Musculoskeletal: Normal range of motion. He exhibits no edema.  Neurological: He is alert and oriented to person, place, and time.  Skin: Skin is warm and dry. He is not diaphoretic.  Nursing note and vitals reviewed.   ED Course  Procedures (including critical care time) Medications - No data to display  Labs Review Labs Reviewed  BASIC  METABOLIC PANEL - Abnormal; Notable for the following:    GFR calc non Af Amer 87 (*)    All other components within normal limits  CBC  HEPATIC FUNCTION PANEL  LIPASE, BLOOD  I-STAT TROPOININ, ED  Randolm Idol, ED    Imaging Review Dg Chest 2 View  07/30/2014   CLINICAL DATA:  Initial evaluation for chest pain for 2 days  EXAM: CHEST  2 VIEW  COMPARISON:  07/14/2007  FINDINGS: Uncoiling of the aorta. Heart size and vascular pattern normal. No infiltrate or consolidation. No effusion. A mm nodular opacity laterally right upper lobe, new from prior study.  IMPRESSION: Possible pulmonary nodule.  CT thorax recommended.   Electronically Signed   By: Skipper Cliche M.D.   On: 07/30/2014 20:14     EKG Interpretation None       11:22 PM Discussed patient's HEART score puts him in a moderate risk group for serious cardiac event. Discussed that he may currently be having a serious cardiac event that has not manifested itself in an obvious way i.e. Elevated Troponin, EKG changes despite negative work up at this time. Also discussed with the patient that he could have an arrhythmia or be at risk for sudden cardiac death. Patient is aware of the risk involved with his work up. He has discussed with his family, myself, as well as Dr. Tamera Punt regarding admission for CP r/o vs discharge home with follow up with his PCP and cardiologist for out patient tests and would like to be discharged home with follow up.   MDM   Final diagnoses:  Chest pain, unspecified chest pain type    Filed Vitals:   07/30/14 2307  BP: 138/88  Pulse: 56  Temp:   Resp: 10   Concern for cardiac etiology of Chest Pain. Pt does not meet criteria for CP protocol and a further evaluation is recommended. Patient's heart score puts him at a moderate risk for life threatening cardiac event. Patient does not wish to be admitted at this time. Pt has been re-evaluated prior to d/c and VSS, NAD, heart RRR, pain 0/10, lungs  CTAB. No acute abnormalities found on EKG and first two rounds of cardiac enzymes negative. Explicit return precautions were given to the patient states he will return if he has any return of chest pain or other concerning symptoms.This case was discussed with Dr. Tamera Punt who has seen the patient.      Harlow Mares, PA-C 07/31/14 Weeki Wachee, MD 07/31/14 1513

## 2014-07-30 NOTE — Discharge Instructions (Signed)
Please go to the [facility] Emergency Department to finish the evaluation for cardiac causes of chest pain.  Follow up with your GI doctor as scheduled.

## 2014-08-02 ENCOUNTER — Telehealth: Payer: Self-pay | Admitting: Family Medicine

## 2014-08-02 ENCOUNTER — Other Ambulatory Visit: Payer: Self-pay | Admitting: Family Medicine

## 2014-08-02 DIAGNOSIS — R079 Chest pain, unspecified: Secondary | ICD-10-CM

## 2014-08-02 DIAGNOSIS — R911 Solitary pulmonary nodule: Secondary | ICD-10-CM

## 2014-08-02 NOTE — Telephone Encounter (Signed)
Pt would like dr burchette to give him a call about the cardio stress test referral. Pt states things have gotten all messed uo and heartcare does not even have him an available appt w/ the dr he wants until dec. Pt wants to go straight in for the stress test and they are having him come in for a consult.  Anyway, pt would prefer to speak to the doc.

## 2014-08-02 NOTE — Telephone Encounter (Signed)
Pt called triage last week ([facility]RI),and was sent to ED. They have advised pt to get a stress test. Pt states they wanted to keep pt overnight fri nite and do stress test, but pt refused. So advised pt this stress test needs to be done asap.  Pt states he is not having any pain at the present.  This all started w/ heartburn.  Pt has seen Dr. Conception Oms D. Irish Lack, MD in the past and wants this dr. [address] [place], [place] [address]  [phone]  Pt needs referral to see this dr.

## 2014-08-02 NOTE — Telephone Encounter (Signed)
Referral is ordered

## 2014-08-02 NOTE — Telephone Encounter (Signed)
OK to set up referral.

## 2014-08-02 NOTE — Telephone Encounter (Signed)
Spoke with patient. Recent visit to ER with nonexertional chest pain. Rule out for MI. No chest pain since then. He is being treated for reflux and patient thinks this is all reflux related. Never had exertional chest pain. Recommendation was for outpatient stress test evaluation. He had stress test 6 years ago normal. Moderate risk factors.  Patient also had nonspecific small pulmonary nodule right upper lobe laterally. This is new from prior studies. Recommendation for CT chest per radiology. Patient's never smoked. No appetite or weight changes. No cough.  We'll set up nuclear exercise stress test and CT chest of further evaluate. Pt agrees with this plan.  He refuses cardiology follow up at this time and could not get in until December anyway. He knows to follow-up immediately for any recurrent chest pain

## 2014-08-04 ENCOUNTER — Ambulatory Visit (INDEPENDENT_AMBULATORY_CARE_PROVIDER_SITE_OTHER)
Admission: RE | Admit: 2014-08-04 | Discharge: 2014-08-04 | Disposition: A | Discharge status: Home / Self Care | Payer: Medicare Other | Source: Ambulatory Visit | Attending: Family Medicine | Admitting: Family Medicine

## 2014-08-04 ENCOUNTER — Ambulatory Visit (HOSPITAL_COMMUNITY): Payer: Medicare Other | Attending: Cardiovascular Disease | Admitting: Radiology

## 2014-08-04 DIAGNOSIS — R911 Solitary pulmonary nodule: Secondary | ICD-10-CM | POA: Diagnosis not present

## 2014-08-04 DIAGNOSIS — J8410 Pulmonary fibrosis, unspecified: Secondary | ICD-10-CM | POA: Diagnosis not present

## 2014-08-04 DIAGNOSIS — R0789 Other chest pain: Secondary | ICD-10-CM | POA: Insufficient documentation

## 2014-08-04 DIAGNOSIS — R918 Other nonspecific abnormal finding of lung field: Secondary | ICD-10-CM | POA: Diagnosis not present

## 2014-08-04 DIAGNOSIS — R079 Chest pain, unspecified: Secondary | ICD-10-CM | POA: Diagnosis not present

## 2014-08-04 DIAGNOSIS — Z8249 Family history of ischemic heart disease and other diseases of the circulatory system: Secondary | ICD-10-CM | POA: Insufficient documentation

## 2014-08-04 DIAGNOSIS — R590 Localized enlarged lymph nodes: Secondary | ICD-10-CM | POA: Diagnosis not present

## 2014-08-04 HISTORY — PX: CARDIOVASCULAR STRESS TEST: SHX262

## 2014-08-04 MED ORDER — TECHNETIUM TC 99M SESTAMIBI GENERIC - CARDIOLITE
30.0000 | Freq: Once | INTRAVENOUS | Status: AC | PRN
  Administered 2014-08-04: 30 via INTRAVENOUS

## 2014-08-04 MED ORDER — TECHNETIUM TC 99M SESTAMIBI GENERIC - CARDIOLITE
10.0000 | Freq: Once | INTRAVENOUS | Status: AC | PRN
  Administered 2014-08-04: 10 via INTRAVENOUS

## 2014-08-04 MED ORDER — IOHEXOL 300 MG/ML  SOLN
80.0000 mL | Freq: Once | INTRAMUSCULAR | Status: AC | PRN
  Administered 2014-08-04: 80 mL via INTRAVENOUS

## 2014-08-04 NOTE — Progress Notes (Signed)
[facility] [address] [place], [place] [address] [phone]    Cardiology Nuclear Med Study  Micheal [name] is a 68 y.o. male     MRN : [id number]     DOB: Feb 09, 1946  Procedure Date: 08/04/2014  Nuclear Med Background Indication for Stress Test:  Evaluation for Ischemia, and Patient seen in hospital on 07-30-2014 for Chest Pain,  Enzymes negative History:  MPI ~6 yrs ago per pt., No known CAD Cardiac Risk Factors: Family History - CAD and Hypertension  Symptoms:  Chest Tightness (last date of chest discomfort was one week ago)   Nuclear Pre-Procedure Caffeine/Decaff Intake:  None> 12 hrs NPO After: 8:30pm   Lungs:  clear O2 Sat: 95% on room air. IV 0.9% NS with Angio Cath:  22g  IV Site: R Antecubital x 1, tolerated well IV Started by:  Irven Baltimore, RN  Chest Size (in):  40 Cup Size: n/a  Height: 5\' 10"  (1.778 m)  Weight:  176 lb (79.833 kg)  BMI:  Body mass index is 25.25 kg/(m^2). Tech Comments:  Patient took Benicar last night. Irven Baltimore, RN.    Nuclear Med Study 1 or 2 day study: 1 day  Stress Test Type:  Stress  Reading MD: N/A  Order Authorizing Provider:  Larae Grooms, MD, and Carolann Littler, MD  Resting Radionuclide: Technetium 66m Sestamibi  Resting Radionuclide Dose: 11.0 mCi   Stress Radionuclide:  Technetium 103m Sestamibi  Stress Radionuclide Dose: 33.0 mCi           Stress Protocol Rest HR: 58 Stress HR: 136  Rest BP: 141/81 Stress BP: 192/96  Exercise Time (min): 6:00 METS: 7.0   Predicted Max HR: 153 bpm % Max HR: 88.89 bpm Rate Pressure Product: 26112   Dose of Adenosine (mg):  n/a Dose of Lexiscan: n/a mg  Dose of Atropine (mg): n/a Dose of Dobutamine: n/a mcg/kg/min (at max HR)  Stress Test Technologist: Glade Lloyd, BS-ES  Nuclear Technologist:  Earl Many, CNMT     Rest Procedure:  Myocardial perfusion imaging was performed at rest 45 minutes following the intravenous administration of  Technetium 43m Sestamibi. Rest ECG: NSR - Normal EKG  Stress Procedure:  The patient exercised on the treadmill utilizing the Bruce Protocol for 6:00 minutes. The patient stopped due to knee pain and denied any chest pain.  Technetium 5m Sestamibi was injected at peak exercise and myocardial perfusion imaging was performed after a brief delay. Stress ECG: No significant change from baseline ECG  QPS Raw Data Images:  Normal; no motion artifact; normal heart/lung ratio. Stress Images:  Normal homogeneous uptake in all areas of the myocardium. Rest Images:  Normal homogeneous uptake in all areas of the myocardium. Subtraction (SDS):  No evidence of ischemia. Transient Ischemic Dilatation (Normal <1.22):  0.97 Lung/Heart Ratio (Normal <0.45):  0.34  Quantitative Gated Spect Images QGS EDV:  96 ml QGS ESV:  44 ml  Impression Exercise Capacity:  Fair exercise capacity. BP Response:  Normal blood pressure response. Clinical Symptoms:  No significant symptoms noted. ECG Impression:  No significant ST segment change suggestive of ischemia. Comparison with Prior Nuclear Study: No previous nuclear study performed  Overall Impression:  Normal stress nuclear study.  LV Ejection Fraction: 54%.  LV Wall Motion:  NL LV Function; NL Wall Motion   Sanda Klein, MD, [facility] [phone] office [phone] pager

## 2014-08-05 ENCOUNTER — Inpatient Hospital Stay: Admission: RE | Admit: 2014-08-05 | Payer: Medicare Other | Source: Ambulatory Visit

## 2014-08-06 ENCOUNTER — Telehealth: Payer: Self-pay | Admitting: Family Medicine

## 2014-08-06 ENCOUNTER — Institutional Professional Consult (permissible substitution): Payer: PRIVATE HEALTH INSURANCE | Admitting: Cardiology

## 2014-08-06 NOTE — Telephone Encounter (Signed)
Pt would like results of stress test. Pt would like dr burchette to cb.

## 2014-08-06 NOTE — Telephone Encounter (Signed)
Pt informed

## 2014-08-20 DIAGNOSIS — K219 Gastro-esophageal reflux disease without esophagitis: Secondary | ICD-10-CM | POA: Diagnosis not present

## 2014-08-20 DIAGNOSIS — R1310 Dysphagia, unspecified: Secondary | ICD-10-CM | POA: Diagnosis not present

## 2014-08-23 ENCOUNTER — Other Ambulatory Visit: Payer: Self-pay | Admitting: Physician Assistant

## 2014-08-23 DIAGNOSIS — R1310 Dysphagia, unspecified: Secondary | ICD-10-CM

## 2014-08-23 DIAGNOSIS — K219 Gastro-esophageal reflux disease without esophagitis: Secondary | ICD-10-CM

## 2014-08-24 ENCOUNTER — Ambulatory Visit
Admission: RE | Admit: 2014-08-24 | Discharge: 2014-08-24 | Disposition: A | Discharge status: Home / Self Care | Payer: Medicare Other | Source: Ambulatory Visit | Attending: Physician Assistant | Admitting: Physician Assistant

## 2014-08-24 DIAGNOSIS — K219 Gastro-esophageal reflux disease without esophagitis: Secondary | ICD-10-CM

## 2014-08-24 DIAGNOSIS — R1310 Dysphagia, unspecified: Secondary | ICD-10-CM

## 2014-08-27 ENCOUNTER — Other Ambulatory Visit: Payer: Self-pay | Admitting: Gastroenterology

## 2014-08-27 DIAGNOSIS — R1310 Dysphagia, unspecified: Secondary | ICD-10-CM | POA: Diagnosis not present

## 2014-08-27 DIAGNOSIS — K219 Gastro-esophageal reflux disease without esophagitis: Secondary | ICD-10-CM | POA: Diagnosis not present

## 2014-09-02 ENCOUNTER — Ambulatory Visit: Payer: PRIVATE HEALTH INSURANCE | Admitting: Interventional Cardiology

## 2014-10-14 DIAGNOSIS — Z471 Aftercare following joint replacement surgery: Secondary | ICD-10-CM | POA: Diagnosis not present

## 2014-10-14 DIAGNOSIS — M1711 Unilateral primary osteoarthritis, right knee: Secondary | ICD-10-CM | POA: Diagnosis not present

## 2014-10-14 DIAGNOSIS — Z96652 Presence of left artificial knee joint: Secondary | ICD-10-CM | POA: Diagnosis not present

## 2014-10-14 DIAGNOSIS — M25561 Pain in right knee: Secondary | ICD-10-CM | POA: Diagnosis not present

## 2014-10-15 DIAGNOSIS — K5901 Slow transit constipation: Secondary | ICD-10-CM | POA: Diagnosis not present

## 2014-10-15 DIAGNOSIS — R143 Flatulence: Secondary | ICD-10-CM | POA: Diagnosis not present

## 2014-10-15 DIAGNOSIS — R490 Dysphonia: Secondary | ICD-10-CM | POA: Diagnosis not present

## 2014-10-15 DIAGNOSIS — K219 Gastro-esophageal reflux disease without esophagitis: Secondary | ICD-10-CM | POA: Diagnosis not present

## 2014-10-20 DIAGNOSIS — R490 Dysphonia: Secondary | ICD-10-CM | POA: Diagnosis not present

## 2014-10-20 DIAGNOSIS — J387 Other diseases of larynx: Secondary | ICD-10-CM | POA: Diagnosis not present

## 2014-10-27 ENCOUNTER — Ambulatory Visit (INDEPENDENT_AMBULATORY_CARE_PROVIDER_SITE_OTHER): Payer: Medicare Other | Admitting: Family Medicine

## 2014-10-27 ENCOUNTER — Encounter: Payer: Self-pay | Admitting: Family Medicine

## 2014-10-27 VITALS — BP 130/70 | HR 60 | Temp 97.4°F | Wt 180.0 lb

## 2014-10-27 DIAGNOSIS — I10 Essential (primary) hypertension: Secondary | ICD-10-CM | POA: Diagnosis not present

## 2014-10-27 DIAGNOSIS — F419 Anxiety disorder, unspecified: Secondary | ICD-10-CM

## 2014-10-27 DIAGNOSIS — E785 Hyperlipidemia, unspecified: Secondary | ICD-10-CM | POA: Diagnosis not present

## 2014-10-27 DIAGNOSIS — Z0000 Encounter for general adult medical examination without abnormal findings: Secondary | ICD-10-CM

## 2014-10-27 LAB — HEPATIC FUNCTION PANEL
ALT: 15 U/L (ref 0–53)
AST: 18 U/L (ref 0–37)
Albumin: 4 g/dL (ref 3.5–5.2)
Alkaline Phosphatase: 76 U/L (ref 39–117)
Bilirubin, Direct: 0.2 mg/dL (ref 0.0–0.3)
Total Bilirubin: 0.9 mg/dL (ref 0.2–1.2)
Total Protein: 7.4 g/dL (ref 6.0–8.3)

## 2014-10-27 LAB — LIPID PANEL
Cholesterol: 138 mg/dL (ref 0–200)
HDL: 40.7 mg/dL (ref >39.00)
LDL Cholesterol: 77 mg/dL (ref 0–99)
NonHDL: 97.3
TRIGLYCERIDES: 101 mg/dL (ref 0.0–149.0)
Total CHOL/HDL Ratio: 3
VLDL: 20.2 mg/dL (ref 0.0–40.0)

## 2014-10-27 MED ORDER — OLMESARTAN MEDOXOMIL 20 MG PO TABS: 20.0000 mg | ORAL_TABLET | Freq: Every day | ORAL | Status: DC

## 2014-10-27 MED ORDER — ATORVASTATIN CALCIUM 20 MG PO TABS: 20.0000 mg | ORAL_TABLET | Freq: Every day | ORAL | Status: DC

## 2014-10-27 MED ORDER — SERTRALINE HCL 50 MG PO TABS: 50.0000 mg | ORAL_TABLET | Freq: Every day | ORAL | Status: DC

## 2014-10-27 NOTE — Patient Instructions (Signed)
Continue with yearly flu vaccine. All your other vaccines are up to date.

## 2014-10-27 NOTE — Progress Notes (Signed)
Subjective:    Patient ID: Micheal [name], male    DOB: 06-May-1946, 69 y.o.   MRN: [id number]  HPI Patient seen for Medicare wellness exam and medical follow-up. He just had recent colonoscopy back in October with a couple of benign polyps. He has history of GERD and has made some lifestyle changes during the past year with giving up caffeine and dietary changes and is currently stable off medication for that. His chronic problems include hypertension, hyperlipidemia, history of prostate cancer, and history of anxiety. Medications reviewed. He remains on Lipitor, Benicar, and sertraline. Requesting refills of all 3. No side effects.  Recently had some left knee pains. Previous knee replacement. He has planned arthroscopy for March.  He has history of essential tremor which has been evaluated by neurology. Currently stable and not progressive and not treated with any medications  Reviewed with no changes:  Past Medical History  Diagnosis Date  . Arthritis   . Hyperlipidemia   . Hypertension   . Kidney stone   . Urinary incontinence   . Cancer     prostate  . Osteoarthritis   . OSA (obstructive sleep apnea)   . Anxiety   . Tremor    Past Surgical History  Procedure Laterality Date  . Prostatectomy    . Lumbar disc surgery  2004  . Knee arthroscopy  2010    bilateral  . Total knee arthroplasty  2009    left knee    reports that he has never smoked. He has never used smokeless tobacco. He reports that he drinks alcohol. He reports that he does not use illicit drugs. family history includes Aneurysm in his father; Heart attack in an other family member; Sudden death in his mother. No Known Allergies  1.  Risk factors based on Past Medical , Social, and Family history reviewed and as indicated above with no changes 2.  Limitations in physical activities None.  No recent falls. Did have one fall during the past year with no injury but no recent fall. Good balance 3.   Depression/mood No active depression or anxiety issues acutely. He does have some chronic anxiety which is been managed with sertraline for quite some time 4.  Hearing No defiits 5.  ADLs independent in all. 6.  Cognitive function (orientation to time and place, language, writing, speech,memory) no short or long term memory issues.  Language and judgement intact. 7.  Home Safety no issues 8.  Height, weight, and visual acuity.all stable. 9.  Counseling discussed alternative exercises. He is unable to walk or run because of his knee issues 10. Recommendation of preventive services. All immunizations up-to-date. Continue yearly flu vaccine 11. Labs based on risk factors lipid and hepatic panel. He gets PSA screen through urology 12. Care Plan as above 13. Other Providers Dr. Cristina Gong -GI, Dr Adrian Prows. 14. Written schedule of screening/prevention services given to patient.    Review of Systems  Constitutional: Negative for fever, activity change, appetite change, fatigue and unexpected weight change.  HENT: Negative for congestion, ear pain and trouble swallowing.   Eyes: Negative for pain and visual disturbance.  Respiratory: Negative for cough, shortness of breath and wheezing.   Cardiovascular: Negative for chest pain and palpitations.  Gastrointestinal: Negative for nausea, vomiting, abdominal pain, diarrhea, constipation, blood in stool, abdominal distention and rectal pain.  Endocrine: Negative for polydipsia and polyuria.  Genitourinary: Negative for dysuria, hematuria and testicular pain.  Musculoskeletal: Positive for back pain and arthralgias. Negative for  joint swelling.  Skin: Negative for rash.  Neurological: Negative for dizziness, syncope, weakness and headaches.  Hematological: Negative for adenopathy.  Psychiatric/Behavioral: Negative for confusion and dysphoric mood.       Objective:   Physical Exam  Constitutional: He is oriented to person, place, and time. He  appears well-developed and well-nourished. No distress.  HENT:  Head: Normocephalic and atraumatic.  Right Ear: External ear normal.  Left Ear: External ear normal.  Mouth/Throat: Oropharynx is clear and moist.  Eyes: Conjunctivae and EOM are normal. Pupils are equal, round, and reactive to light.  Neck: Normal range of motion. Neck supple. No thyromegaly present.  Cardiovascular: Normal rate, regular rhythm and normal heart sounds.   No murmur heard. Pulmonary/Chest: No respiratory distress. He has no wheezes. He has no rales.  Abdominal: Soft. Bowel sounds are normal. He exhibits no distension and no mass. There is no tenderness. There is no rebound and no guarding.  Genitourinary:  Per urology. Also had recent colonoscopy as above.  Musculoskeletal: He exhibits no edema.  Lymphadenopathy:    He has no cervical adenopathy.  Neurological: He is alert and oriented to person, place, and time. He displays normal reflexes. No cranial nerve deficit.  Skin: No rash noted.  Psychiatric: He has a normal mood and affect.          Assessment & Plan:  #1 Medicare wellness exam. Immunizations are up-to-date. Continue yearly flu vaccine. Colonoscopy up-to-date. He has advanced directive with living will which was recently updated #2 hyperlipidemia. Check lipid and hepatic panel. Refill atorvastatin for one year #3 hypertension. Stable and at goal. Refill Benicar for one year #4 history of chronic anxiety. Stable on sertraline. Refills for one year

## 2014-10-27 NOTE — Progress Notes (Signed)
Pre visit review using our clinic review tool, if applicable. No additional management support is needed unless otherwise documented below in the visit note. 

## 2014-11-17 ENCOUNTER — Ambulatory Visit: Payer: Self-pay | Admitting: Orthopedic Surgery

## 2014-11-17 NOTE — Progress Notes (Signed)
Preoperative surgical orders have been place into the Epic hospital system for Micheal [name] on 11/17/2014, 5:29 PM  by Mickel Crow for surgery on 12-08-14.  Preop Knee Scope orders including IV Tylenol and IV Decadron as long as there are no contraindications to the above medications. [name], PA-C

## 2014-12-02 ENCOUNTER — Encounter (HOSPITAL_BASED_OUTPATIENT_CLINIC_OR_DEPARTMENT_OTHER): Payer: Self-pay | Admitting: *Deleted

## 2014-12-03 ENCOUNTER — Encounter (HOSPITAL_BASED_OUTPATIENT_CLINIC_OR_DEPARTMENT_OTHER): Payer: Self-pay | Admitting: *Deleted

## 2014-12-03 NOTE — Progress Notes (Signed)
To [facility] at 0915-Istat on arrival-Ekg with chart.Instructed Npo after Mn solids-clear liquids only till 0530,then Npo.

## 2014-12-08 ENCOUNTER — Encounter (HOSPITAL_BASED_OUTPATIENT_CLINIC_OR_DEPARTMENT_OTHER): Payer: Self-pay | Admitting: Anesthesiology

## 2014-12-08 ENCOUNTER — Ambulatory Visit (HOSPITAL_BASED_OUTPATIENT_CLINIC_OR_DEPARTMENT_OTHER)
Admission: RE | Admit: 2014-12-08 | Discharge: 2014-12-08 | Disposition: A | Discharge status: Home / Self Care | Payer: Medicare Other | Source: Ambulatory Visit | Attending: Orthopedic Surgery | Admitting: Orthopedic Surgery

## 2014-12-08 ENCOUNTER — Ambulatory Visit (HOSPITAL_BASED_OUTPATIENT_CLINIC_OR_DEPARTMENT_OTHER): Payer: Medicare Other | Admitting: Anesthesiology

## 2014-12-08 ENCOUNTER — Encounter (HOSPITAL_BASED_OUTPATIENT_CLINIC_OR_DEPARTMENT_OTHER)
Admission: RE | Disposition: A | Discharge status: Home / Self Care | Payer: Self-pay | Source: Ambulatory Visit | Attending: Orthopedic Surgery

## 2014-12-08 DIAGNOSIS — Z79899 Other long term (current) drug therapy: Secondary | ICD-10-CM | POA: Diagnosis not present

## 2014-12-08 DIAGNOSIS — M65862 Other synovitis and tenosynovitis, left lower leg: Secondary | ICD-10-CM | POA: Diagnosis not present

## 2014-12-08 DIAGNOSIS — F419 Anxiety disorder, unspecified: Secondary | ICD-10-CM | POA: Insufficient documentation

## 2014-12-08 DIAGNOSIS — Z8546 Personal history of malignant neoplasm of prostate: Secondary | ICD-10-CM | POA: Insufficient documentation

## 2014-12-08 DIAGNOSIS — G4733 Obstructive sleep apnea (adult) (pediatric): Secondary | ICD-10-CM | POA: Diagnosis not present

## 2014-12-08 DIAGNOSIS — M12262 Villonodular synovitis (pigmented), left knee: Secondary | ICD-10-CM | POA: Diagnosis not present

## 2014-12-08 DIAGNOSIS — I10 Essential (primary) hypertension: Secondary | ICD-10-CM | POA: Insufficient documentation

## 2014-12-08 DIAGNOSIS — M659 Synovitis and tenosynovitis, unspecified: Secondary | ICD-10-CM | POA: Diagnosis present

## 2014-12-08 DIAGNOSIS — M170 Bilateral primary osteoarthritis of knee: Secondary | ICD-10-CM | POA: Insufficient documentation

## 2014-12-08 DIAGNOSIS — E785 Hyperlipidemia, unspecified: Secondary | ICD-10-CM | POA: Diagnosis not present

## 2014-12-08 DIAGNOSIS — M67262 Synovial hypertrophy, not elsewhere classified, left lower leg: Secondary | ICD-10-CM | POA: Diagnosis not present

## 2014-12-08 DIAGNOSIS — Z87442 Personal history of urinary calculi: Secondary | ICD-10-CM | POA: Insufficient documentation

## 2014-12-08 DIAGNOSIS — M25562 Pain in left knee: Secondary | ICD-10-CM | POA: Diagnosis present

## 2014-12-08 DIAGNOSIS — Z96652 Presence of left artificial knee joint: Secondary | ICD-10-CM | POA: Insufficient documentation

## 2014-12-08 HISTORY — DX: Personal history of urinary calculi: Z87.442

## 2014-12-08 HISTORY — DX: Essential tremor: G25.0

## 2014-12-08 HISTORY — DX: Urge incontinence: N39.41

## 2014-12-08 HISTORY — PX: KNEE ARTHROSCOPY: SHX127

## 2014-12-08 HISTORY — DX: Personal history of colonic polyps: Z86.010

## 2014-12-08 HISTORY — DX: Diverticulosis of large intestine without perforation or abscess without bleeding: K57.30

## 2014-12-08 HISTORY — DX: Personal history of colon polyps, unspecified: Z86.0100

## 2014-12-08 HISTORY — DX: Obstructive sleep apnea (adult) (pediatric): G47.33

## 2014-12-08 HISTORY — DX: Personal history of malignant neoplasm of prostate: Z85.46

## 2014-12-08 HISTORY — DX: Synovial hypertrophy, not elsewhere classified, left lower leg: M67.262

## 2014-12-08 LAB — POCT I-STAT 4, (NA,K, GLUC, HGB,HCT)
GLUCOSE: 102 mg/dL — AB (ref 70–99)
HCT: 44 % (ref 39.0–52.0)
Hemoglobin: 15 g/dL (ref 13.0–17.0)
Potassium: 4.3 mmol/L (ref 3.5–5.1)
Sodium: 142 mmol/L (ref 135–145)

## 2014-12-08 SURGERY — ARTHROSCOPY, KNEE
Anesthesia: General | Site: Knee | Laterality: Left

## 2014-12-08 MED ORDER — EPHEDRINE SULFATE 50 MG/ML IJ SOLN
INTRAMUSCULAR | Status: DC | PRN
  Administered 2014-12-08: 15 mg via INTRAVENOUS

## 2014-12-08 MED ORDER — PROPOFOL 10 MG/ML IV BOLUS
INTRAVENOUS | Status: DC | PRN
  Administered 2014-12-08: 180 mg via INTRAVENOUS

## 2014-12-08 MED ORDER — CEFAZOLIN SODIUM-DEXTROSE 2-3 GM-% IV SOLR
2.0000 g | INTRAVENOUS | Status: AC
  Administered 2014-12-08: 2 g via INTRAVENOUS
  Filled 2014-12-08: qty 50

## 2014-12-08 MED ORDER — SODIUM CHLORIDE 0.9 % IR SOLN
Status: DC | PRN
  Administered 2014-12-08: 6000 mL
  Administered 2014-12-08: 3000 mL

## 2014-12-08 MED ORDER — FENTANYL CITRATE 0.05 MG/ML IJ SOLN
INTRAMUSCULAR | Status: DC | PRN
  Administered 2014-12-08: 50 ug via INTRAVENOUS
  Administered 2014-12-08 (×2): 25 ug via INTRAVENOUS

## 2014-12-08 MED ORDER — METHOCARBAMOL 500 MG PO TABS: 500.0000 mg | ORAL_TABLET | Freq: Four times a day (QID) | ORAL | Status: DC

## 2014-12-08 MED ORDER — KETOROLAC TROMETHAMINE 30 MG/ML IJ SOLN
INTRAMUSCULAR | Status: DC | PRN
  Administered 2014-12-08: 30 mg via INTRAVENOUS

## 2014-12-08 MED ORDER — SODIUM CHLORIDE 0.9 % IV SOLN
INTRAVENOUS | Status: DC
  Filled 2014-12-08: qty 1000

## 2014-12-08 MED ORDER — DEXAMETHASONE SODIUM PHOSPHATE 4 MG/ML IJ SOLN
INTRAMUSCULAR | Status: DC | PRN
  Administered 2014-12-08: 10 mg via INTRAVENOUS

## 2014-12-08 MED ORDER — ONDANSETRON HCL 4 MG/2ML IJ SOLN
INTRAMUSCULAR | Status: DC | PRN
  Administered 2014-12-08: 4 mg via INTRAVENOUS

## 2014-12-08 MED ORDER — MIDAZOLAM HCL 2 MG/2ML IJ SOLN
INTRAMUSCULAR | Status: AC
  Filled 2014-12-08: qty 2

## 2014-12-08 MED ORDER — FENTANYL CITRATE 0.05 MG/ML IJ SOLN
INTRAMUSCULAR | Status: AC
  Filled 2014-12-08: qty 4

## 2014-12-08 MED ORDER — BUPIVACAINE-EPINEPHRINE 0.25% -1:200000 IJ SOLN
INTRAMUSCULAR | Status: DC | PRN
  Administered 2014-12-08: 20 mL

## 2014-12-08 MED ORDER — HYDROCODONE-ACETAMINOPHEN 5-325 MG PO TABS: 1.0000 | ORAL_TABLET | Freq: Four times a day (QID) | ORAL | Status: DC | PRN

## 2014-12-08 MED ORDER — CEFAZOLIN SODIUM-DEXTROSE 2-3 GM-% IV SOLR
INTRAVENOUS | Status: AC
  Filled 2014-12-08: qty 50

## 2014-12-08 MED ORDER — ACETAMINOPHEN 10 MG/ML IV SOLN
INTRAVENOUS | Status: DC | PRN
  Administered 2014-12-08: 1000 mg via INTRAVENOUS

## 2014-12-08 MED ORDER — MIDAZOLAM HCL 5 MG/5ML IJ SOLN
INTRAMUSCULAR | Status: DC | PRN
  Administered 2014-12-08: 1 mg via INTRAVENOUS

## 2014-12-08 MED ORDER — STERILE WATER FOR IRRIGATION IR SOLN
Status: DC | PRN
  Administered 2014-12-08: 500 mL

## 2014-12-08 MED ORDER — LIDOCAINE HCL (CARDIAC) 20 MG/ML IV SOLN
INTRAVENOUS | Status: DC | PRN
  Administered 2014-12-08: 50 mg via INTRAVENOUS

## 2014-12-08 MED ORDER — LACTATED RINGERS IV SOLN
INTRAVENOUS | Status: DC
  Administered 2014-12-08: 10:00:00 via INTRAVENOUS
  Filled 2014-12-08: qty 1000

## 2014-12-08 MED ORDER — CHLORHEXIDINE GLUCONATE 4 % EX LIQD
60.0000 mL | Freq: Once | CUTANEOUS | Status: DC
  Filled 2014-12-08: qty 60

## 2014-12-08 SURGICAL SUPPLY — 37 items
BANDAGE ELASTIC 6 VELCRO ST LF (GAUZE/BANDAGES/DRESSINGS) ×3 IMPLANT
BLADE 4.2CUDA (BLADE) ×3 IMPLANT
BLADE CUDA SHAVER 3.5 (BLADE) ×3 IMPLANT
BLADE CUTTER GATOR 3.5 (BLADE) IMPLANT
CANISTER SUCT LVC 12 LTR MEDI- (MISCELLANEOUS) ×3 IMPLANT
CANISTER SUCTION 2500CC (MISCELLANEOUS) IMPLANT
CLOTH BEACON ORANGE TIMEOUT ST (SAFETY) ×3 IMPLANT
DRAPE ARTHROSCOPY W/POUCH 114 (DRAPES) ×3 IMPLANT
DRSG EMULSION OIL 3X3 NADH (GAUZE/BANDAGES/DRESSINGS) ×3 IMPLANT
DURAPREP 26ML APPLICATOR (WOUND CARE) ×3 IMPLANT
ELECT MENISCUS 165MM 90D (ELECTRODE) IMPLANT
ELECT REM PT RETURN 9FT ADLT (ELECTROSURGICAL)
ELECTRODE REM PT RTRN 9FT ADLT (ELECTROSURGICAL) IMPLANT
GLOVE BIO SURGEON STRL SZ8 (GLOVE) ×3 IMPLANT
GLOVE INDICATOR 8.0 STRL GRN (GLOVE) ×3 IMPLANT
GOWN PREVENTION PLUS LG XLONG (DISPOSABLE) IMPLANT
GOWN STRL REUS W/ TWL LRG LVL3 (GOWN DISPOSABLE) ×2 IMPLANT
GOWN STRL REUS W/TWL LRG LVL3 (GOWN DISPOSABLE) ×4
IV NS IRRIG 3000ML ARTHROMATIC (IV SOLUTION) ×9 IMPLANT
KNEE WRAP E Z 3 GEL PACK (MISCELLANEOUS) ×3 IMPLANT
PACK ARTHROSCOPY DSU (CUSTOM PROCEDURE TRAY) ×3 IMPLANT
PACK BASIN DAY SURGERY FS (CUSTOM PROCEDURE TRAY) ×3 IMPLANT
PADDING CAST ABS 4INX4YD NS (CAST SUPPLIES) ×2
PADDING CAST ABS 6INX4YD NS (CAST SUPPLIES) ×2
PADDING CAST ABS COTTON 4X4 ST (CAST SUPPLIES) ×1 IMPLANT
PADDING CAST ABS COTTON 6X4 NS (CAST SUPPLIES) ×1 IMPLANT
PADDING CAST COTTON 6X4 STRL (CAST SUPPLIES) ×3 IMPLANT
PENCIL BUTTON HOLSTER BLD 10FT (ELECTRODE) IMPLANT
SET ARTHROSCOPY TUBING (MISCELLANEOUS) ×2
SET ARTHROSCOPY TUBING LN (MISCELLANEOUS) ×1 IMPLANT
SPONGE GAUZE 4X4 12PLY (GAUZE/BANDAGES/DRESSINGS) ×3 IMPLANT
SPONGE GAUZE 4X4 12PLY STER LF (GAUZE/BANDAGES/DRESSINGS) ×3 IMPLANT
SUT ETHILON 4 0 PS 2 18 (SUTURE) ×3 IMPLANT
TOWEL OR 17X24 6PK STRL BLUE (TOWEL DISPOSABLE) ×3 IMPLANT
WAND 30 DEG SABER W/CORD (SURGICAL WAND) IMPLANT
WAND 90 DEG TURBOVAC W/CORD (SURGICAL WAND) ×3 IMPLANT
WATER STERILE IRR 500ML POUR (IV SOLUTION) ×3 IMPLANT

## 2014-12-08 NOTE — Anesthesia Postprocedure Evaluation (Signed)
  Anesthesia Post-op Note  Patient: Micheal [name]  Procedure(s) Performed: Procedure(s) (LRB): ARTHROSCOPY LEFT KNEE WITH SYNOVECTOMY (Left)  Patient Location: PACU  Anesthesia Type: General  Level of Consciousness: awake and alert   Airway and Oxygen Therapy: Patient Spontanous Breathing  Post-op Pain: mild  Post-op Assessment: Post-op Vital signs reviewed, Patient's Cardiovascular Status Stable, Respiratory Function Stable, Patent Airway and No signs of Nausea or vomiting  Last Vitals:  Filed Vitals:   12/08/14 1230  BP: 130/74  Pulse: 63  Temp:   Resp: 11    Post-op Vital Signs: stable   Complications: No apparent anesthesia complications

## 2014-12-08 NOTE — Anesthesia Preprocedure Evaluation (Addendum)
Anesthesia Evaluation  Patient identified by MRN, date of birth, ID band Patient awake    Reviewed: Allergy & Precautions, NPO status , Patient's Chart, lab work & pertinent test results  Airway Mallampati: II  TM Distance: >3 FB Neck ROM: Full    Dental no notable dental hx.    Pulmonary sleep apnea ,  breath sounds clear to auscultation  Pulmonary exam normal       Cardiovascular hypertension, Rhythm:Regular Rate:Normal  Heart block listed on 1 EKG NSR on another   Neuro/Psych negative neurological ROS  negative psych ROS   GI/Hepatic negative GI ROS, Neg liver ROS,   Endo/Other  negative endocrine ROS  Renal/GU negative Renal ROS  negative genitourinary   Musculoskeletal negative musculoskeletal ROS (+)   Abdominal   Peds negative pediatric ROS (+)  Hematology negative hematology ROS (+)   Anesthesia Other Findings   Reproductive/Obstetrics negative OB ROS                            Anesthesia Physical Anesthesia Plan  ASA: II  Anesthesia Plan: General   Post-op Pain Management:    Induction: Intravenous  Airway Management Planned: LMA  Additional Equipment:   Intra-op Plan:   Post-operative Plan: Extubation in OR  Informed Consent: I have reviewed the patients History and Physical, chart, labs and discussed the procedure including the risks, benefits and alternatives for the proposed anesthesia with the patient or authorized representative who has indicated his/her understanding and acceptance.   Dental advisory given  Plan Discussed with: CRNA and Surgeon  Anesthesia Plan Comments: (No versed)       Anesthesia Quick Evaluation

## 2014-12-08 NOTE — H&P (Signed)
CC- Micheal [name] is a 69 y.o. male who presents with left knee pain.  HPI- . Knee Pain: Patient presents with knee pain involving the  left knee. Onset of the symptoms was several months ago. Inciting event: He had a left total knee arthroplasty several years ago and recently developed painful popping in the knee. Current symptoms include crepitus sensation, foreign body sensation and popping sensation. Pain is aggravated by going up and down stairs, lateral movements and rising after sitting.  Patient has had prior knee problems. Evaluation to date: plain films: normal. Treatment to date: none.  Past Medical History  Diagnosis Date  . Hyperlipidemia   . Hypertension   . Anxiety   . Benign essential tremor     hands  . History of colon polyps   . Sigmoid diverticulosis   . Urge urinary incontinence   . History of kidney stones   . History of prostate cancer     s/p  radial prostatectomy w/ nerve sparing 07-17-2007/   stage T1c,  Gleason 3+3=6,  PSA 6.33  . Osteoarthritis     knees  . Synovial hypertrophy of left knee   . OSA (obstructive sleep apnea)   . Mild obstructive sleep apnea     uses mouth guard only    Past Surgical History  Procedure Laterality Date  . Knee arthroscopy Bilateral right 07-31-2009//  left   . Total knee arthroplasty Left 03-08-2008  . Robot assisted laparoscopic radical prostatectomy  07-17-2007  . Lumbar disc surgery  04-13-2002    left  L4 -- L5  . Tonsillectomy  as child  . Colonoscopy w/ polypectomy  06-21-2014  . Cardiovascular stress test  08-04-2014    normal perfusion nuclear study/  normal LV function and wall motion , ef 54%  . Barium swallow  08/24/2014    Prior to Admission medications   Medication Sig Start Date End Date Taking? Authorizing Provider  Probiotic Product (ALIGN PO) Take by mouth daily after supper.   Yes Historical Provider, MD  ALPRAZolam Duanne Moron) 0.5 MG tablet Take 1 tablet (0.5 mg total) by mouth every 8 (eight)  hours as needed for anxiety. 1/2 to 1 tab by mouth as needed for anxiety 10/26/13   [name], MD  atorvastatin (LIPITOR) 20 MG tablet Take 1 tablet (20 mg total) by mouth daily. 10/27/14   [name], MD  olmesartan (BENICAR) 20 MG tablet Take 1 tablet (20 mg total) by mouth daily. Patient taking differently: Take 20 mg by mouth daily after supper.  10/27/14   [name], MD  pantoprazole (PROTONIX) 40 MG tablet Take 40 mg by mouth daily. 07/30/14   Historical Provider, MD  sertraline (ZOLOFT) 50 MG tablet Take 1 tablet (50 mg total) by mouth daily. Patient taking differently: Take 50 mg by mouth daily after supper.  10/27/14   [name], MD   KNEE EXAM antalgic gait, no effusion, collateral ligaments intact, crepitus on range of motion, no instability  Physical Examination: General appearance - alert, well appearing, and in no distress Mental status - alert, oriented to person, place, and time Chest - clear to auscultation, no wheezes, rales or rhonchi, symmetric air entry Heart - normal rate, regular rhythm, normal S1, S2, no murmurs, rubs, clicks or gallops Abdomen - soft, nontender, nondistended, no masses or organomegaly Neurological - alert, oriented, normal speech, no focal findings or movement disorder noted   Asessment/Plan--- Left knee hypertrophic synovitis- - Plan left knee arthroscopy  with synovectomy. Procedure risks and potential comps discussed with patient who elects to proceed. Goals are decreased pain and increased function with a high likelihood of achieving both

## 2014-12-08 NOTE — Anesthesia Procedure Notes (Signed)
Procedure Name: LMA Insertion Date/Time: 12/08/2014 11:01 AM Performed by: Mechele Claude Pre-anesthesia Checklist: Patient identified, Emergency Drugs available, Suction available and Patient being monitored Patient Re-evaluated:Patient Re-evaluated prior to inductionOxygen Delivery Method: Circle System Utilized Preoxygenation: Pre-oxygenation with 100% oxygen Intubation Type: IV induction Ventilation: Mask ventilation without difficulty LMA: LMA inserted LMA Size: 4.0 Number of attempts: 1 Airway Equipment and Method: bite block Placement Confirmation: positive ETCO2 Tube secured with: Tape Dental Injury: Teeth and Oropharynx as per pre-operative assessment

## 2014-12-08 NOTE — Transfer of Care (Signed)
Last Vitals:  Filed Vitals:   12/08/14 0933  BP: 131/77  Pulse: 66  Temp: 37.1 C  Resp: 16   Immediate Anesthesia Transfer of Care Note  Patient: Micheal [name]  Procedure(s) Performed: Procedure(s) (LRB): ARTHROSCOPY LEFT KNEE WITH SYNOVECTOMY (Left)  Patient Location: PACU  Anesthesia Type: General  Level of Consciousness: awake, alert  and oriented  Airway & Oxygen Therapy: Patient Spontanous Breathing and Patient connected to face mask oxygen  Post-op Assessment: Report given to PACU RN and Post -op Vital signs reviewed and stable  Post vital signs: Reviewed and stable  Complications: No apparent anesthesia complications

## 2014-12-08 NOTE — Interval H&P Note (Signed)
History and Physical Interval Note:  12/08/2014 10:51 AM  Micheal [name]  has presented today for surgery, with the diagnosis of left knee hypertrophic synovitis  The various methods of treatment have been discussed with the patient and family. After consideration of risks, benefits and other options for treatment, the patient has consented to  Procedure(s): ARTHROSCOPY LEFT KNEE WITH SYNOVECTOMY (Left) as a surgical intervention .  The patient's history has been reviewed, patient examined, no change in status, stable for surgery.  I have reviewed the patient's chart and labs.  Questions were answered to the patient's satisfaction.     [name]

## 2014-12-08 NOTE — Brief Op Note (Signed)
12/08/2014  11:40 AM  PATIENT:  Micheal [name]  69 y.o. male  PRE-OPERATIVE DIAGNOSIS:  left knee hypertrophic synovitis  POST-OPERATIVE DIAGNOSIS:  left knee hypertrophic synovitis  PROCEDURE:  Procedure(s): ARTHROSCOPY LEFT KNEE WITH SYNOVECTOMY (Left)  SURGEON:  Surgeon(s) and Role:    * Gaynelle Arabian, MD - Primary  PHYSICIAN ASSISTANT:   ASSISTANTS: None   ANESTHESIA:   general  EBL:  Total I/O In: 900 [I.V.:900] Out: -   BLOOD ADMINISTERED:none  DRAINS: none   LOCAL MEDICATIONS USED:  MARCAINE     COUNTS:  YES  TOURNIQUET:    DICTATION: .Other Dictation: Dictation Number [id number]  PLAN OF CARE: Discharge to home after PACU  PATIENT DISPOSITION:  PACU - hemodynamically stable.

## 2014-12-08 NOTE — Discharge Instructions (Signed)
Arthroscopic Procedure, Knee °An arthroscopic procedure can find what is wrong with your knee. °PROCEDURE °Arthroscopy is a surgical technique that allows your orthopedic surgeon to diagnose and treat your knee injury with accuracy. They will look into your knee through a small instrument. This is almost like a small (pencil sized) telescope. Because arthroscopy affects your knee less than open knee surgery, you can anticipate a more rapid recovery. Taking an active role by following your caregiver's instructions will help with rapid and complete recovery. Use crutches, rest, elevation, ice, and knee exercises as instructed. The length of recovery depends on various factors including type of injury, age, physical condition, medical conditions, and your rehabilitation. °Your knee is the joint between the large bones (femur and tibia) in your leg. Cartilage covers these bone ends which are smooth and slippery and allow your knee to bend and move smoothly. Two menisci, thick, semi-lunar shaped pads of cartilage which form a rim inside the joint, help absorb shock and stabilize your knee. Ligaments bind the bones together and support your knee joint. Muscles move the joint, help support your knee, and take stress off the joint itself. Because of this all programs and physical therapy to rehabilitate an injured or repaired knee require rebuilding and strengthening your muscles. °AFTER THE PROCEDURE °· After the procedure, you will be moved to a recovery area until most of the effects of the medication have worn off. Your caregiver will discuss the test results with you.  °· Only take over-the-counter or prescription medicines for pain, discomfort, or fever as directed by your caregiver.  °SEEK MEDICAL CARE IF:  °· You have increased bleeding from your wounds.  °· You see redness, swelling, or have increasing pain in your wounds.  °· You have pus coming from your wound.  °· You have an oral temperature above 102° F (38.9°  C).  °· You notice a bad smell coming from the wound or dressing.  °· You have severe pain with any motion of your knee.  °SEEK IMMEDIATE MEDICAL CARE IF:  °· You develop a rash.  °· You have difficulty breathing.  °· You have any allergic problems.  °FURTHER INSTRUCTIONS: °· You may start showering two days after being discharged home but do not submerge the incisions under water.  °· Change dressing 48 hours after the procedure and then cover the small incisions with band aids until your follow up visit. °· Avoid periods of inactivity such as sitting longer than an hour when not asleep. This helps prevent blood clots.  °· You may put full weight on your legs and walk as much as is comfortable.  °· Do not drive while taking narcotics.  °Wear the elastic stockings for three weeks following surgery during the day but you may remove then at night. °· Make sure you keep all of your appointments after your operation with all of your doctors and caregivers. You should call the office at [phone] and make an appointment for approximately one week after the date of your surgery. °· Please pick up a stool softener and laxative for home use as long as you are requiring pain medications. °· ICE to the affected knee every three hours for 30 minutes at a time and then as needed for pain and swelling.  Continue to use ice on the knee for pain and swelling from surgery. You may notice swelling that will progress down to the foot and ankle.  This is normal after surgery.  Elevate the   leg when you are not up walking on it.   RANGE OF MOTION AND STRENGTHENING EXERCISES  Rehabilitation of the knee is important following a knee injury or an operation. After just a few days of immobilization, the muscles of the thigh which control the knee become weakened and shrink (atrophy). Knee exercises are designed to build up the tone and strength of the thigh muscles and to improve knee motion. Often times heat used for twenty to thirty  minutes before working out will loosen up your tissues and help with improving the range of motion but do not use heat for the first two weeks following surgery. These exercises can be done on a training (exercise) mat, on the floor, on a table or on a bed. Use what ever works the best and is most comfortable for you Knee exercises include:       QUAD STRENGTHENING EXERCISES Strengthening Quadriceps Sets  Tighten muscles on top of thigh by pushing knees down into floor or table. Hold for 20 seconds. Repeat 10 times. Do 2 sessions per day.    Strengthening Terminal Knee Extension  With knee bent over bolster, straighten knee by tightening muscle on top of thigh. Be sure to keep bottom of knee on bolster. Hold for 20 seconds. Repeat 10 times. Do 2 sessions per day.   Straight Leg with Bent Knee  Lie on back with opposite leg bent. Keep involved knee slightly bent at knee and raise leg 4-6". Hold for 10 seconds. Repeat 20 times per set. Do 2 sets per session. Do 2 sessions per day.   Post Anesthesia Home Care Instructions  Activity: Get plenty of rest for the remainder of the day. A responsible adult should stay with you for 24 hours following the procedure.  For the next 24 hours, DO NOT: -Drive a car -Paediatric nurse -Drink alcoholic beverages -Take any medication unless instructed by your physician -Make any legal decisions or sign important papers.  Meals: Start with liquid foods such as gelatin or soup. Progress to regular foods as tolerated. Avoid greasy, spicy, heavy foods. If nausea and/or vomiting occur, drink only clear liquids until the nausea and/or vomiting subsides. Call your physician if vomiting continues.  Special Instructions/Symptoms: Your throat may feel dry or sore from the anesthesia or the breathing tube placed in your throat during surgery. If this causes discomfort, gargle with warm salt water. The discomfort should disappear within 24 hours.

## 2014-12-09 ENCOUNTER — Encounter (HOSPITAL_BASED_OUTPATIENT_CLINIC_OR_DEPARTMENT_OTHER): Payer: Self-pay | Admitting: Orthopedic Surgery

## 2014-12-09 NOTE — Op Note (Signed)
NAMEHIRAN, LEARD NO.:  [id number]  MEDICAL RECORD NO.:  [id number]  LOCATION:                                 FACILITY:  PHYSICIAN:  Gaynelle Arabian, M.D.    DATE OF BIRTH:  1945-11-12  DATE OF PROCEDURE:  12/08/2014 DATE OF DISCHARGE:                              OPERATIVE REPORT   PREOPERATIVE DIAGNOSIS:  Left knee hypertrophic synovitis.  POSTOPERATIVE DIAGNOSIS:  Left knee hypertrophic synovitis.  PROCEDURE:  Left knee arthroscopy with synovectomy.  SURGEON:  Gaynelle Arabian, M.D.  ASSISTANT:  No assistant.  ANESTHESIA:  General.  ESTIMATED BLOOD LOSS:  Minimal.  DRAINS:  None.  COMPLICATIONS:  None.  CONDITION:  Stable to recovery.  BRIEF CLINICAL NOTE:  Mr. Micheal [name] is a 69 year old male several years out from left total knee arthroplasty.  Recently, he has developed a painful catching in the knee.  It is consistent with hypertrophic synovitis.  He has had impingement from 1 particular area inferomedially.  He presents now for arthroscopy and debridement.  PROCEDURE IN DETAIL:  After successful administration of general anesthetic, tourniquet was placed high on the left thigh and left lower extremity, prepped and draped in the usual sterile fashion.  Standard superomedial, inferolateral incisions were made.  Inflow cannula passed superomedial.  Camera passed inferolateral.  Arthroscopic visualization proceeds.  There was a large amount of hypertrophic tissue at the junction of the quad tendon and the patella.  A superolateral portal was created and this tissue was debrided back to a normal-appearing tissue with a shaver and ArthroCare device.  A lot of his symptoms with infrapatellar and medial.  I inspected this area.  There was a large thickened band of tissue at the inferomedial aspect of the joint which was appeared to be impinging on the tibial polyethylene.  I debrided this using a combination of the shaver and the ArthroCare.  I  switched portals and put the inflow lateral and the working portal superomedial and was easily able to clear out the medial gutter and all the tissue that potentially was impinging.  We cleaned at the infrapatellar hypertrophic tissue and on the lateral side everything looked normal. The arthroscopic equipment was then removed from the superomedial, inferolateral portal, and then they are closed with interrupted 4-0 nylons.  A 20 mL of 0.25% Marcaine with epinephrine was injected through the inflow cannula and that was removed and that portal was closed with nylon.  Incisions cleaned and dried and a bulky sterile dressing applied.  He was then awakened and transported to recovery in stable condition.     Gaynelle Arabian, M.D.     FA/MEDQ  D:  12/08/2014  T:  12/09/2014  Job:  [id number]

## 2014-12-14 DIAGNOSIS — Z471 Aftercare following joint replacement surgery: Secondary | ICD-10-CM | POA: Diagnosis not present

## 2014-12-14 DIAGNOSIS — Z96652 Presence of left artificial knee joint: Secondary | ICD-10-CM | POA: Diagnosis not present

## 2014-12-14 DIAGNOSIS — Z4789 Encounter for other orthopedic aftercare: Secondary | ICD-10-CM | POA: Diagnosis not present

## 2015-01-11 DIAGNOSIS — Z471 Aftercare following joint replacement surgery: Secondary | ICD-10-CM | POA: Diagnosis not present

## 2015-01-11 DIAGNOSIS — Z96652 Presence of left artificial knee joint: Secondary | ICD-10-CM | POA: Diagnosis not present

## 2015-01-11 DIAGNOSIS — M1711 Unilateral primary osteoarthritis, right knee: Secondary | ICD-10-CM | POA: Diagnosis not present

## 2015-01-11 DIAGNOSIS — M25561 Pain in right knee: Secondary | ICD-10-CM | POA: Diagnosis not present

## 2015-02-11 ENCOUNTER — Ambulatory Visit (INDEPENDENT_AMBULATORY_CARE_PROVIDER_SITE_OTHER): Payer: Medicare Other | Admitting: Family Medicine

## 2015-02-11 ENCOUNTER — Encounter: Payer: Self-pay | Admitting: Family Medicine

## 2015-02-11 VITALS — BP 120/76 | HR 72 | Temp 98.2°F | Wt 182.0 lb

## 2015-02-11 DIAGNOSIS — H6092 Unspecified otitis externa, left ear: Secondary | ICD-10-CM

## 2015-02-11 DIAGNOSIS — H1013 Acute atopic conjunctivitis, bilateral: Secondary | ICD-10-CM

## 2015-02-11 MED ORDER — NEOMYCIN-POLYMYXIN-HC 1 % OT SOLN: 3.0000 [drp] | Freq: Four times a day (QID) | OTIC | Status: DC

## 2015-02-11 NOTE — Patient Instructions (Signed)

## 2015-02-11 NOTE — Progress Notes (Signed)
Pre visit review using our clinic review tool, if applicable. No additional management support is needed unless otherwise documented below in the visit note. 

## 2015-02-11 NOTE — Progress Notes (Signed)
   Subjective:    Patient ID: Micheal [name], male    DOB: 15-Aug-1946, 69 y.o.   MRN: [id number]  HPI   Patient seen as a work in. He just got back from [place]. He complains of left ear pain along with watery eyes, stuffy nose, occasional ringing in both ears. No headache. No fever. Frequent sneezing. Took some Benadryl with minimal relief. Taken occasional Sudafed. No fevers or chills.   Review of Systems  Constitutional: Negative for fever and chills.  HENT: Positive for congestion and ear pain.   Respiratory: Negative for cough.        Objective:   Physical Exam  Constitutional: He appears well-developed and well-nourished.  HENT:  Right Ear: External ear normal.  Mouth/Throat: Oropharynx is clear and moist.  Left external canal is erythematous and moderately swollen. No exudate.  Neck: Neck supple.  Cardiovascular: Normal rate and regular rhythm.   Pulmonary/Chest: Effort normal and breath sounds normal. No respiratory distress. He has no wheezes. He has no rales.  Lymphadenopathy:    He has no cervical adenopathy.          Assessment & Plan:  #1 left otitis externa. Keep ear dry as possible. Cortisporin otic solution 3 drops left ear 4 times daily #2 allergic conjunctivitis. Allegra 180 mg once daily

## 2015-02-22 DIAGNOSIS — L509 Urticaria, unspecified: Secondary | ICD-10-CM | POA: Diagnosis not present

## 2015-04-18 ENCOUNTER — Ambulatory Visit (INDEPENDENT_AMBULATORY_CARE_PROVIDER_SITE_OTHER): Payer: Medicare Other | Admitting: Family Medicine

## 2015-04-18 ENCOUNTER — Encounter: Payer: Self-pay | Admitting: Family Medicine

## 2015-04-18 VITALS — BP 124/80 | HR 58 | Temp 98.2°F | Wt 185.0 lb

## 2015-04-18 DIAGNOSIS — R05 Cough: Secondary | ICD-10-CM | POA: Diagnosis not present

## 2015-04-18 DIAGNOSIS — R059 Cough, unspecified: Secondary | ICD-10-CM

## 2015-04-18 DIAGNOSIS — J3089 Other allergic rhinitis: Secondary | ICD-10-CM | POA: Diagnosis not present

## 2015-04-18 MED ORDER — AZELASTINE HCL 0.1 % NA SOLN: 2.0000 | Freq: Two times a day (BID) | NASAL | Status: DC

## 2015-04-18 NOTE — Progress Notes (Signed)
Pre visit review using our clinic review tool, if applicable. No additional management support is needed unless otherwise documented below in the visit note. 

## 2015-04-18 NOTE — Patient Instructions (Signed)
Add back the Nasacort to the Claritan If no relief with that, consider adding the Astelin nasal spray.

## 2015-04-18 NOTE — Progress Notes (Signed)
   Subjective:    Patient ID: Micheal [name], male    DOB: 1945-10-28, 69 y.o.   MRN: [id number]  HPI Patient is nonsmoker seen with about 4 week history of mostly dry cough. Occasionally productive of clear sputum. He has frequent sneezing and postnasal drip symptoms. Denies GERD. He has recently seen dermatologist with recurrent acute urticaria. These have been fairly well controlled taking Claritin twice daily. He denies any dyspnea, chest pain, hemoptysis, fever, chills, or any appetite or weight changes. He has had some nonspecific malaise. No history of wheezing. No ACE inhibitor use.  He feels his cough is mostly allergy related. He was previously using Nasacort AQ which seemed to work well for him  Past Medical History  Diagnosis Date  . Hyperlipidemia   . Hypertension   . Anxiety   . Benign essential tremor     hands  . History of colon polyps   . Sigmoid diverticulosis   . Urge urinary incontinence   . History of kidney stones   . History of prostate cancer     s/p  radial prostatectomy w/ nerve sparing 07-17-2007/   stage T1c,  Gleason 3+3=6,  PSA 6.33  . Osteoarthritis     knees  . Synovial hypertrophy of left knee   . OSA (obstructive sleep apnea)   . Mild obstructive sleep apnea     uses mouth guard only   Past Surgical History  Procedure Laterality Date  . Knee arthroscopy Bilateral right 07-31-2009//  left   . Total knee arthroplasty Left 03-08-2008  . Robot assisted laparoscopic radical prostatectomy  07-17-2007  . Lumbar disc surgery  04-13-2002    left  L4 -- L5  . Tonsillectomy  as child  . Colonoscopy w/ polypectomy  06-21-2014  . Cardiovascular stress test  08-04-2014    normal perfusion nuclear study/  normal LV function and wall motion , ef 54%  . Barium swallow  08/24/2014  . Knee arthroscopy Left 12/08/2014    Procedure: ARTHROSCOPY LEFT KNEE WITH SYNOVECTOMY;  Surgeon: Gaynelle Arabian, MD;  Location: [facility];  Service: Orthopedics;   Laterality: Left;    reports that he has never smoked. He has never used smokeless tobacco. He reports that he drinks alcohol. He reports that he does not use illicit drugs. family history includes Aneurysm in his father; Heart attack in an other family member; Sudden death in his mother. No Known Allergies    Review of Systems  Constitutional: Negative for fever and chills.  HENT: Positive for postnasal drip and rhinorrhea.   Respiratory: Positive for cough. Negative for shortness of breath and wheezing.   Cardiovascular: Negative for chest pain.       Objective:   Physical Exam  Constitutional: He appears well-developed and well-nourished.  HENT:  Right Ear: External ear normal.  Left Ear: External ear normal.  Nose: Nose normal.  Mouth/Throat: Oropharynx is clear and moist.  Neck: Neck supple.  Cardiovascular: Normal rate and regular rhythm.   Pulmonary/Chest: Effort normal and breath sounds normal. No respiratory distress. He has no wheezes. He has no rales.  Lymphadenopathy:    He has no cervical adenopathy.          Assessment & Plan:  Persistent cough. Suspect allergic postnasal drip related. Continue Claritin. Add back Nasacort once daily. If no relief in 1-2 weeks consider addition of Astelin nasal. Touch base if cough not improving over the next couple of weeks

## 2015-06-23 DIAGNOSIS — Z23 Encounter for immunization: Secondary | ICD-10-CM | POA: Diagnosis not present

## 2015-06-27 ENCOUNTER — Other Ambulatory Visit: Payer: Self-pay | Admitting: Dermatology

## 2015-06-27 DIAGNOSIS — L820 Inflamed seborrheic keratosis: Secondary | ICD-10-CM | POA: Diagnosis not present

## 2015-06-27 DIAGNOSIS — D485 Neoplasm of uncertain behavior of skin: Secondary | ICD-10-CM | POA: Diagnosis not present

## 2015-06-28 DIAGNOSIS — C61 Malignant neoplasm of prostate: Secondary | ICD-10-CM | POA: Diagnosis not present

## 2015-07-04 DIAGNOSIS — M549 Dorsalgia, unspecified: Secondary | ICD-10-CM | POA: Diagnosis not present

## 2015-07-04 DIAGNOSIS — Z6826 Body mass index (BMI) 26.0-26.9, adult: Secondary | ICD-10-CM | POA: Diagnosis not present

## 2015-07-04 DIAGNOSIS — M542 Cervicalgia: Secondary | ICD-10-CM | POA: Diagnosis not present

## 2015-07-05 DIAGNOSIS — N5231 Erectile dysfunction following radical prostatectomy: Secondary | ICD-10-CM | POA: Diagnosis not present

## 2015-07-05 DIAGNOSIS — Z8546 Personal history of malignant neoplasm of prostate: Secondary | ICD-10-CM | POA: Diagnosis not present

## 2015-10-14 ENCOUNTER — Other Ambulatory Visit: Payer: Self-pay | Admitting: Family Medicine

## 2015-10-31 ENCOUNTER — Encounter: Payer: Self-pay | Admitting: Family Medicine

## 2015-10-31 ENCOUNTER — Ambulatory Visit (INDEPENDENT_AMBULATORY_CARE_PROVIDER_SITE_OTHER): Payer: Medicare Other | Admitting: Family Medicine

## 2015-10-31 VITALS — BP 120/82 | HR 75 | Temp 98.2°F | Ht 69.75 in | Wt 190.0 lb

## 2015-10-31 DIAGNOSIS — Z23 Encounter for immunization: Secondary | ICD-10-CM

## 2015-10-31 DIAGNOSIS — Z0000 Encounter for general adult medical examination without abnormal findings: Secondary | ICD-10-CM | POA: Diagnosis not present

## 2015-10-31 DIAGNOSIS — I10 Essential (primary) hypertension: Secondary | ICD-10-CM | POA: Diagnosis not present

## 2015-10-31 DIAGNOSIS — H60543 Acute eczematoid otitis externa, bilateral: Secondary | ICD-10-CM

## 2015-10-31 DIAGNOSIS — E785 Hyperlipidemia, unspecified: Secondary | ICD-10-CM | POA: Diagnosis not present

## 2015-10-31 LAB — BASIC METABOLIC PANEL
BUN: 18 mg/dL (ref 6–23)
CHLORIDE: 106 meq/L (ref 96–112)
CO2: 30 mEq/L (ref 19–32)
Calcium: 9.7 mg/dL (ref 8.4–10.5)
Creatinine, Ser: 0.93 mg/dL (ref 0.40–1.50)
GFR: 85.71 mL/min (ref >60.00)
GLUCOSE: 97 mg/dL (ref 70–99)
POTASSIUM: 4.7 meq/L (ref 3.5–5.1)
Sodium: 142 mEq/L (ref 135–145)

## 2015-10-31 LAB — HEPATIC FUNCTION PANEL
ALBUMIN: 4.2 g/dL (ref 3.5–5.2)
ALT: 18 U/L (ref 0–53)
AST: 24 U/L (ref 0–37)
Alkaline Phosphatase: 63 U/L (ref 39–117)
Bilirubin, Direct: 0.2 mg/dL (ref 0.0–0.3)
Total Bilirubin: 0.7 mg/dL (ref 0.2–1.2)
Total Protein: 7.5 g/dL (ref 6.0–8.3)

## 2015-10-31 LAB — LIPID PANEL
CHOLESTEROL: 128 mg/dL (ref 0–200)
HDL: 35.5 mg/dL — AB (ref >39.00)
LDL Cholesterol: 63 mg/dL (ref 0–99)
NonHDL: 92.52
Total CHOL/HDL Ratio: 4
Triglycerides: 147 mg/dL (ref 0.0–149.0)
VLDL: 29.4 mg/dL (ref 0.0–40.0)

## 2015-10-31 MED ORDER — MOMETASONE FUROATE 0.1 % EX SOLN: Freq: Every day | CUTANEOUS | Status: DC

## 2015-10-31 MED ORDER — OLMESARTAN MEDOXOMIL 20 MG PO TABS: 20.0000 mg | ORAL_TABLET | Freq: Every day | ORAL | Status: DC

## 2015-10-31 MED ORDER — ATORVASTATIN CALCIUM 20 MG PO TABS: 20.0000 mg | ORAL_TABLET | Freq: Every day | ORAL | Status: DC

## 2015-10-31 MED ORDER — SERTRALINE HCL 50 MG PO TABS: 50.0000 mg | ORAL_TABLET | Freq: Every day | ORAL | Status: DC

## 2015-10-31 NOTE — Progress Notes (Signed)
Pre visit review using our clinic review tool, if applicable. No additional management support is needed unless otherwise documented below in the visit note. 

## 2015-10-31 NOTE — Patient Instructions (Signed)
Health Maintenance  Topic Date Due  . Hepatitis C Screening  04/21/1946  . PNA vac Low Risk Adult (2 of 2 - PPSV23) 10/26/2014  . INFLUENZA VACCINE  04/18/2015  . TETANUS/TDAP  12/26/2023  . COLONOSCOPY  06/21/2024  . ZOSTAVAX  Completed

## 2015-10-31 NOTE — Progress Notes (Signed)
Subjective:    Patient ID: Micheal [name], male    DOB: 05/20/1946, 70 y.o.   MRN: [id number]  HPI Patient is here for medical follow-up and Medicare subsequent annual wellness visit. He's had Prevnar 13 but needs Pneumovax. Other vaccines up-to-date. Colonoscopy up-to-date.  History of prostate cancer. Followed yearly by urology and PSAs been consistently of 0. He had previous prostatectomy.  Hypertension treated with Benicar. Blood pressures been stable. He has had some weight gain during the past year. No consistent exercise.  Hyperlipidemia treated with Lipitor. No recent chest pains. No history of CAD or peripheral vascular disease  New acute problem of pruritic rash both external ear canals. He's noticed some scaling.  No alleviating factors  1.  Risk factors based on Past Medical , Social, and Family history reviewed and as indicated above with no changes 2.  Limitations in physical activities None.  No recent falls. 3.  Depression/mood No active depression or anxiety issues 4.  Hearing No defiits 5.  ADLs independent in all. 6.  Cognitive function (orientation to time and place, language, writing, speech,memory) no short or long term memory issues.  Language and judgement intact. 7.  Home Safety no issues 8.  Height, weight, and visual acuity.all stable. 9.  Counseling discussed importance of regular weightbearing exercise such as walking 10. Recommendation of preventive services. Pneumovax 11. Labs based on risk factors lipid, hepatic, basic metabolic panel 12. Care Plan as above. 13. Other Providers Dr Adrian Prows. 14. Written schedule of screening/prevention services given to patient.  Health Maintenance  Topic Date Due  . Hepatitis C Screening  [birth year]  . PNA vac Low Risk Adult (2 of 2 - PPSV23) 10/26/2014  . INFLUENZA VACCINE  04/18/2015  . TETANUS/TDAP  12/26/2023  . COLONOSCOPY  06/21/2024  . ZOSTAVAX  Completed     Review of Systems  Constitutional:  Negative for fever, activity change, appetite change and fatigue.  HENT: Negative for congestion, ear pain and trouble swallowing.   Eyes: Negative for pain and visual disturbance.  Respiratory: Negative for cough, shortness of breath and wheezing.   Cardiovascular: Negative for chest pain and palpitations.  Gastrointestinal: Negative for nausea, vomiting, abdominal pain, diarrhea, constipation, blood in stool, abdominal distention and rectal pain.  Genitourinary: Negative for dysuria, hematuria and testicular pain.  Musculoskeletal: Positive for arthralgias (mostly left thumb). Negative for joint swelling.  Skin: Positive for rash.       See history of present illness  Neurological: Negative for dizziness, syncope and headaches.  Hematological: Negative for adenopathy.  Psychiatric/Behavioral: Negative for confusion and dysphoric mood.       Objective:   Physical Exam  Constitutional: He is oriented to person, place, and time. He appears well-developed and well-nourished. No distress.  HENT:  Head: Normocephalic and atraumatic.  Right Ear: External ear normal.  Left Ear: External ear normal.  Mouth/Throat: Oropharynx is clear and moist.  Eyes: Conjunctivae and EOM are normal. Pupils are equal, round, and reactive to light.  Neck: Normal range of motion. Neck supple. No thyromegaly present.  Cardiovascular: Normal rate, regular rhythm and normal heart sounds.   No murmur heard. Pulmonary/Chest: No respiratory distress. He has no wheezes. He has no rales.  Abdominal: Soft. Bowel sounds are normal. He exhibits no distension and no mass. There is no tenderness. There is no rebound and no guarding.  Genitourinary:  Per urology  Musculoskeletal: He exhibits no edema.  Lymphadenopathy:    He has no cervical adenopathy.  Neurological:  He is alert and oriented to person, place, and time. He displays normal reflexes. No cranial nerve deficit.  Skin: Rash noted.  Slightly scaly rash both  external canals.  Psychiatric: He has a normal mood and affect.          Assessment & Plan:  #1 Medicare subsequent annual wellness visit. Pneumovax given. Other immunizations up-to-date. Colonoscopy up-to-date. Recommend more consistent exercise such as walking  #2 hypertension stable and at goal. Continue Benicar. Refills given  #3 hyperlipidemia. Continue Lipitor with refills given. Check lipid and hepatic panel  #4 eczema both ear canals. Elocon 0.1% lotion twice daily as needed

## 2015-12-20 DIAGNOSIS — Z471 Aftercare following joint replacement surgery: Secondary | ICD-10-CM | POA: Diagnosis not present

## 2015-12-20 DIAGNOSIS — M1711 Unilateral primary osteoarthritis, right knee: Secondary | ICD-10-CM | POA: Diagnosis not present

## 2015-12-20 DIAGNOSIS — Z96652 Presence of left artificial knee joint: Secondary | ICD-10-CM | POA: Diagnosis not present

## 2015-12-20 DIAGNOSIS — M25561 Pain in right knee: Secondary | ICD-10-CM | POA: Diagnosis not present

## 2016-02-03 ENCOUNTER — Encounter (HOSPITAL_BASED_OUTPATIENT_CLINIC_OR_DEPARTMENT_OTHER): Payer: Self-pay | Admitting: *Deleted

## 2016-02-07 ENCOUNTER — Ambulatory Visit: Payer: Self-pay | Admitting: Orthopedic Surgery

## 2016-02-07 ENCOUNTER — Encounter (HOSPITAL_BASED_OUTPATIENT_CLINIC_OR_DEPARTMENT_OTHER): Payer: Self-pay | Admitting: *Deleted

## 2016-02-07 NOTE — Progress Notes (Signed)
Preoperative surgical orders have been place into the Epic hospital system for [place] on 02/07/2016, 5:22 PM  by Mickel Crow for surgery on 02-14-16.  Preop Knee Scope orders including IV Tylenol and IV Decadron as long as there are no contraindications to the above medications. [name], PA-C

## 2016-02-07 NOTE — Progress Notes (Signed)
NPO AFTER MN WITH EXCEPTION CLEAR LIQUIDS UNTIL 0700 (NO CREAM [place] PRODUCTS) .  ARRIVE AT 1100. NEEDS ISTAT AND EKG.  WILL TAKE ALIGN AM DOS W/ SIPS OF WATER AND IF NEEDED TAKE TYLENOL

## 2016-02-09 ENCOUNTER — Ambulatory Visit: Payer: Self-pay | Admitting: Orthopedic Surgery

## 2016-02-14 ENCOUNTER — Ambulatory Visit (HOSPITAL_BASED_OUTPATIENT_CLINIC_OR_DEPARTMENT_OTHER): Payer: Medicare Other | Admitting: Anesthesiology

## 2016-02-14 ENCOUNTER — Ambulatory Visit (HOSPITAL_BASED_OUTPATIENT_CLINIC_OR_DEPARTMENT_OTHER)
Admission: RE | Admit: 2016-02-14 | Discharge: 2016-02-14 | Disposition: A | Discharge status: Home / Self Care | Payer: Medicare Other | Source: Ambulatory Visit | Attending: Orthopedic Surgery | Admitting: Orthopedic Surgery

## 2016-02-14 ENCOUNTER — Other Ambulatory Visit: Payer: Self-pay

## 2016-02-14 ENCOUNTER — Encounter (HOSPITAL_BASED_OUTPATIENT_CLINIC_OR_DEPARTMENT_OTHER): Payer: Self-pay | Admitting: *Deleted

## 2016-02-14 ENCOUNTER — Encounter (HOSPITAL_BASED_OUTPATIENT_CLINIC_OR_DEPARTMENT_OTHER)
Admission: RE | Disposition: A | Discharge status: Home / Self Care | Payer: Self-pay | Source: Ambulatory Visit | Attending: Orthopedic Surgery

## 2016-02-14 DIAGNOSIS — M65862 Other synovitis and tenosynovitis, left lower leg: Secondary | ICD-10-CM | POA: Diagnosis not present

## 2016-02-14 DIAGNOSIS — M25561 Pain in right knee: Secondary | ICD-10-CM | POA: Diagnosis not present

## 2016-02-14 HISTORY — PX: KNEE ARTHROSCOPY: SHX127

## 2016-02-14 HISTORY — DX: Personal history of other diseases of male genital organs: Z87.438

## 2016-02-14 HISTORY — DX: Nocturia: R35.1

## 2016-02-14 HISTORY — DX: Gastro-esophageal reflux disease without esophagitis: K21.9

## 2016-02-14 LAB — POCT I-STAT, CHEM 8
BUN: 20 mg/dL (ref 6–20)
BUN: 23 mg/dL — AB (ref 6–20)
CALCIUM ION: 1.23 mmol/L (ref 1.13–1.30)
CALCIUM ION: 1.24 mmol/L (ref 1.13–1.30)
CHLORIDE: 103 mmol/L (ref 101–111)
CREATININE: 0.8 mg/dL (ref 0.61–1.24)
Chloride: 103 mmol/L (ref 101–111)
Creatinine, Ser: 0.8 mg/dL (ref 0.61–1.24)
Glucose, Bld: 91 mg/dL (ref 65–99)
Glucose, Bld: 90 mg/dL (ref 65–99)
HEMATOCRIT: 41 % (ref 39.0–52.0)
HEMATOCRIT: 59 % — AB (ref 39.0–52.0)
Hemoglobin: 13.9 g/dL (ref 13.0–17.0)
Hemoglobin: 20.1 g/dL — ABNORMAL HIGH (ref 13.0–17.0)
POTASSIUM: 4.7 mmol/L (ref 3.5–5.1)
POTASSIUM: 4.7 mmol/L (ref 3.5–5.1)
SODIUM: 143 mmol/L (ref 135–145)
SODIUM: 144 mmol/L (ref 135–145)
TCO2: 26 mmol/L (ref 0–100)
TCO2: 26 mmol/L (ref 0–100)

## 2016-02-14 SURGERY — ARTHROSCOPY, KNEE
Anesthesia: General | Site: Knee | Laterality: Left

## 2016-02-14 MED ORDER — PROPOFOL 10 MG/ML IV BOLUS
INTRAVENOUS | Status: AC
  Filled 2016-02-14: qty 20

## 2016-02-14 MED ORDER — KETOROLAC TROMETHAMINE 30 MG/ML IJ SOLN
INTRAMUSCULAR | Status: AC
  Filled 2016-02-14: qty 1

## 2016-02-14 MED ORDER — LIDOCAINE HCL (CARDIAC) 20 MG/ML IV SOLN
INTRAVENOUS | Status: AC
  Filled 2016-02-14: qty 5

## 2016-02-14 MED ORDER — ONDANSETRON HCL 4 MG/2ML IJ SOLN
INTRAMUSCULAR | Status: DC | PRN
  Administered 2016-02-14: 4 mg via INTRAVENOUS

## 2016-02-14 MED ORDER — LIDOCAINE HCL (CARDIAC) 20 MG/ML IV SOLN
INTRAVENOUS | Status: DC | PRN
  Administered 2016-02-14: 80 mg via INTRAVENOUS

## 2016-02-14 MED ORDER — FENTANYL CITRATE (PF) 100 MCG/2ML IJ SOLN
INTRAMUSCULAR | Status: DC | PRN
  Administered 2016-02-14 (×2): 50 ug via INTRAVENOUS

## 2016-02-14 MED ORDER — FENTANYL CITRATE (PF) 100 MCG/2ML IJ SOLN
INTRAMUSCULAR | Status: AC
  Filled 2016-02-14: qty 2

## 2016-02-14 MED ORDER — SERTRALINE HCL 50 MG PO TABS: 50.0000 mg | ORAL_TABLET | Freq: Every day | ORAL | Status: DC

## 2016-02-14 MED ORDER — DEXAMETHASONE SODIUM PHOSPHATE 10 MG/ML IJ SOLN
10.0000 mg | Freq: Once | INTRAMUSCULAR | Status: DC
  Filled 2016-02-14: qty 1

## 2016-02-14 MED ORDER — ACETAMINOPHEN 10 MG/ML IV SOLN
INTRAVENOUS | Status: AC
  Filled 2016-02-14: qty 100

## 2016-02-14 MED ORDER — ACETAMINOPHEN 10 MG/ML IV SOLN
1000.0000 mg | Freq: Once | INTRAVENOUS | Status: AC
  Administered 2016-02-14: 1000 mg via INTRAVENOUS
  Filled 2016-02-14: qty 100

## 2016-02-14 MED ORDER — DEXAMETHASONE SODIUM PHOSPHATE 10 MG/ML IJ SOLN
INTRAMUSCULAR | Status: AC
  Filled 2016-02-14: qty 1

## 2016-02-14 MED ORDER — LACTATED RINGERS IV SOLN
INTRAVENOUS | Status: DC
  Administered 2016-02-14: 12:00:00 via INTRAVENOUS
  Filled 2016-02-14: qty 1000

## 2016-02-14 MED ORDER — CEFAZOLIN SODIUM-DEXTROSE 2-4 GM/100ML-% IV SOLN
2.0000 g | INTRAVENOUS | Status: AC
  Administered 2016-02-14: 2 g via INTRAVENOUS
  Filled 2016-02-14: qty 100

## 2016-02-14 MED ORDER — POVIDONE-IODINE 10 % EX SWAB
2.0000 "application " | Freq: Once | CUTANEOUS | Status: DC
  Filled 2016-02-14: qty 2

## 2016-02-14 MED ORDER — KETOROLAC TROMETHAMINE 30 MG/ML IJ SOLN
INTRAMUSCULAR | Status: DC | PRN
  Administered 2016-02-14: 15 mg via INTRAVENOUS

## 2016-02-14 MED ORDER — FENTANYL CITRATE (PF) 100 MCG/2ML IJ SOLN
25.0000 ug | INTRAMUSCULAR | Status: DC | PRN
  Filled 2016-02-14: qty 1

## 2016-02-14 MED ORDER — OLMESARTAN MEDOXOMIL 20 MG PO TABS: 20.0000 mg | ORAL_TABLET | Freq: Every evening | ORAL | Status: DC

## 2016-02-14 MED ORDER — DEXAMETHASONE SODIUM PHOSPHATE 4 MG/ML IJ SOLN
INTRAMUSCULAR | Status: DC | PRN
  Administered 2016-02-14: 10 mg via INTRAVENOUS

## 2016-02-14 MED ORDER — ONDANSETRON HCL 4 MG/2ML IJ SOLN
INTRAMUSCULAR | Status: AC
  Filled 2016-02-14: qty 2

## 2016-02-14 MED ORDER — HYDROCODONE-ACETAMINOPHEN 5-325 MG PO TABS: 1.0000 | ORAL_TABLET | ORAL | Status: DC | PRN

## 2016-02-14 MED ORDER — SODIUM CHLORIDE 0.9 % IV SOLN
INTRAVENOUS | Status: DC
  Filled 2016-02-14: qty 1000

## 2016-02-14 MED ORDER — CEFAZOLIN SODIUM-DEXTROSE 2-4 GM/100ML-% IV SOLN
INTRAVENOUS | Status: AC
  Filled 2016-02-14: qty 100

## 2016-02-14 MED ORDER — STERILE WATER FOR IRRIGATION IR SOLN
Status: DC | PRN
  Administered 2016-02-14: 500 mL

## 2016-02-14 MED ORDER — PROPOFOL 10 MG/ML IV BOLUS
INTRAVENOUS | Status: DC | PRN
  Administered 2016-02-14: 30 mg via INTRAVENOUS
  Administered 2016-02-14: 150 mg via INTRAVENOUS

## 2016-02-14 MED ORDER — SODIUM CHLORIDE 0.9 % IR SOLN
Status: DC | PRN
  Administered 2016-02-14: 6000 mL

## 2016-02-14 MED ORDER — CHLORHEXIDINE GLUCONATE 4 % EX LIQD
60.0000 mL | Freq: Once | CUTANEOUS | Status: DC
  Filled 2016-02-14: qty 118

## 2016-02-14 SURGICAL SUPPLY — 44 items
BANDAGE ELASTIC 6 VELCRO ST LF (GAUZE/BANDAGES/DRESSINGS) ×3 IMPLANT
BLADE 4.2CUDA (BLADE) ×3 IMPLANT
BLADE CUDA SHAVER 3.5 (BLADE) IMPLANT
BLADE CUTTER GATOR 3.5 (BLADE) IMPLANT
CLOTH BEACON ORANGE TIMEOUT ST (SAFETY) ×3 IMPLANT
CUFF TOURN SGL QUICK 34 (TOURNIQUET CUFF) ×2
CUFF TRNQT CYL 34X4X40X1 (TOURNIQUET CUFF) ×1 IMPLANT
DRAPE ARTHROSCOPY W/POUCH 114 (DRAPES) ×3 IMPLANT
DRAPE U-SHAPE 47X51 STRL (DRAPES) ×3 IMPLANT
DRSG EMULSION OIL 3X3 NADH (GAUZE/BANDAGES/DRESSINGS) ×3 IMPLANT
DRSG PAD ABDOMINAL 8X10 ST (GAUZE/BANDAGES/DRESSINGS) ×3 IMPLANT
DURAPREP 26ML APPLICATOR (WOUND CARE) ×3 IMPLANT
ELECT MENISCUS 165MM 90D (ELECTRODE) IMPLANT
ELECT REM PT RETURN 9FT ADLT (ELECTROSURGICAL)
ELECTRODE REM PT RTRN 9FT ADLT (ELECTROSURGICAL) IMPLANT
GLOVE BIO SURGEON STRL SZ 6.5 (GLOVE) ×2 IMPLANT
GLOVE BIO SURGEON STRL SZ8 (GLOVE) ×3 IMPLANT
GLOVE BIO SURGEONS STRL SZ 6.5 (GLOVE) ×1
GLOVE INDICATOR 6.5 STRL GRN (GLOVE) ×3 IMPLANT
GLOVE INDICATOR 7.5 STRL GRN (GLOVE) ×3 IMPLANT
GLOVE INDICATOR 8.0 STRL GRN (GLOVE) ×3 IMPLANT
GOWN STRL REUS W/ TWL LRG LVL3 (GOWN DISPOSABLE) ×2 IMPLANT
GOWN STRL REUS W/TWL LRG LVL3 (GOWN DISPOSABLE) ×4
IV NS IRRIG 3000ML ARTHROMATIC (IV SOLUTION) ×3 IMPLANT
KIT ROOM TURNOVER WOR (KITS) ×3 IMPLANT
KNEE WRAP E Z 3 GEL PACK (MISCELLANEOUS) ×3 IMPLANT
MANIFOLD NEPTUNE II (INSTRUMENTS) ×3 IMPLANT
PACK ARTHROSCOPY DSU (CUSTOM PROCEDURE TRAY) ×3 IMPLANT
PACK BASIN DAY SURGERY FS (CUSTOM PROCEDURE TRAY) ×3 IMPLANT
PADDING CAST ABS 4INX4YD NS (CAST SUPPLIES) ×2
PADDING CAST ABS COTTON 4X4 ST (CAST SUPPLIES) ×1 IMPLANT
PADDING CAST COTTON 6X4 STRL (CAST SUPPLIES) ×3 IMPLANT
PENCIL BUTTON HOLSTER BLD 10FT (ELECTRODE) IMPLANT
SET ARTHROSCOPY TUBING (MISCELLANEOUS) ×2
SET ARTHROSCOPY TUBING LN (MISCELLANEOUS) ×1 IMPLANT
SPONGE GAUZE 4X4 12PLY (GAUZE/BANDAGES/DRESSINGS) ×3 IMPLANT
SPONGE GAUZE 4X4 12PLY STER LF (GAUZE/BANDAGES/DRESSINGS) ×3 IMPLANT
SUT ETHILON 4 0 PS 2 18 (SUTURE) ×3 IMPLANT
TOWEL OR 17X24 6PK STRL BLUE (TOWEL DISPOSABLE) ×3 IMPLANT
TUBE CONNECTING 12'X1/4 (SUCTIONS)
TUBE CONNECTING 12X1/4 (SUCTIONS) IMPLANT
WAND 30 DEG SABER W/CORD (SURGICAL WAND) IMPLANT
WAND 90 DEG TURBOVAC W/CORD (SURGICAL WAND) ×3 IMPLANT
WATER STERILE IRR 500ML POUR (IV SOLUTION) ×3 IMPLANT

## 2016-02-14 NOTE — Brief Op Note (Signed)
02/14/2016  1:46 PM  PATIENT:  Micheal [name]  70 y.o. male  PRE-OPERATIVE DIAGNOSIS:  LEFT KNEE HYPERTROPHIC SYNOVITIS  POST-OPERATIVE DIAGNOSIS:  LEFT KNEE HYPERTROPHIC SYNOVITIS  PROCEDURE:  Procedure(s): ARTHROSCOPY KNEE WTH SYNOVECTOMY (Left)  SURGEON:  Surgeon(s) and Role:    * Gaynelle Arabian, MD - Primary  PHYSICIAN ASSISTANT:   ASSISTANTS: none   ANESTHESIA:   general  EBL:  Total I/O In: 400 [I.V.:400] Out: 10 [Blood:10]  DRAINS: none   LOCAL MEDICATIONS USED:  MARCAINE     COUNTS:  YES  TOURNIQUET:    DICTATION: .Other Dictation: Dictation Number system did not generate number  PLAN OF CARE: Discharge to home after PACU  PATIENT DISPOSITION:  PACU - hemodynamically stable.

## 2016-02-14 NOTE — Interval H&P Note (Signed)
History and Physical Interval Note:  02/14/2016 1:01 PM  Micheal [name]  has presented today for surgery, with the diagnosis of LEFT KNEE HYPERTROPHIC SYNOVITIS  The various methods of treatment have been discussed with the patient and family. After consideration of risks, benefits and other options for treatment, the patient has consented to  Procedure(s): ARTHROSCOPY KNEE WTH SYNOVECTOMY (Left) as a surgical intervention .  The patient's history has been reviewed, patient examined, no change in status, stable for surgery.  I have reviewed the patient's chart and labs.  Questions were answered to the patient's satisfaction.     [name]

## 2016-02-14 NOTE — Anesthesia Preprocedure Evaluation (Addendum)
Anesthesia Evaluation  Patient identified by MRN, date of birth, ID band Patient awake    Reviewed: Allergy & Precautions, NPO status , Patient's Chart, lab work & pertinent test results  Airway Mallampati: II  TM Distance: >3 FB Neck ROM: Full    Dental no notable dental hx.    Pulmonary sleep apnea ,    Pulmonary exam normal breath sounds clear to auscultation       Cardiovascular hypertension, Normal cardiovascular exam Rhythm:Regular Rate:Normal  Heart block listed on 1 EKG NSR on another   Neuro/Psych negative neurological ROS  negative psych ROS   GI/Hepatic negative GI ROS, Neg liver ROS,   Endo/Other  negative endocrine ROS  Renal/GU negative Renal ROS  negative genitourinary   Musculoskeletal negative musculoskeletal ROS (+)   Abdominal   Peds negative pediatric ROS (+)  Hematology negative hematology ROS (+)   Anesthesia Other Findings   Reproductive/Obstetrics negative OB ROS                           Anesthesia Physical  Anesthesia Plan  ASA: II  Anesthesia Plan: General   Post-op Pain Management:    Induction: Intravenous  Airway Management Planned: LMA  Additional Equipment:   Intra-op Plan:   Post-operative Plan: Extubation in OR  Informed Consent: I have reviewed the patients History and Physical, chart, labs and discussed the procedure including the risks, benefits and alternatives for the proposed anesthesia with the patient or authorized representative who has indicated his/her understanding and acceptance.   Dental advisory given  Plan Discussed with: CRNA and Surgeon  Anesthesia Plan Comments: (Prev knee scope....Marland KitchenMarland KitchenNo versed last time and used LMA  last time w/o difficulty)       Anesthesia Quick Evaluation

## 2016-02-14 NOTE — Discharge Instructions (Signed)
° °Dr. Frank Aluisio °[facility] Specialist °[facility] °[address] °[place], [place] [address] °[phone] ° ° °Arthroscopic Procedure, Knee °An arthroscopic procedure can find what is wrong with your knee. °PROCEDURE °Arthroscopy is a surgical technique that allows your orthopedic surgeon to diagnose and treat your knee injury with accuracy. They will look into your knee through a small instrument. This is almost like a small (pencil sized) telescope. Because arthroscopy affects your knee less than open knee surgery, you can anticipate a more rapid recovery. Taking an active role by following your caregiver's instructions will help with rapid and complete recovery. Use crutches, rest, elevation, ice, and knee exercises as instructed. The length of recovery depends on various factors including type of injury, age, physical condition, medical conditions, and your rehabilitation. °Your knee is the joint between the large bones (femur and tibia) in your leg. Cartilage covers these bone ends which are smooth and slippery and allow your knee to bend and move smoothly. Two menisci, thick, semi-lunar shaped pads of cartilage which form a rim inside the joint, help absorb shock and stabilize your knee. Ligaments bind the bones together and support your knee joint. Muscles move the joint, help support your knee, and take stress off the joint itself. Because of this all programs and physical therapy to rehabilitate an injured or repaired knee require rebuilding and strengthening your muscles. °AFTER THE PROCEDURE °· After the procedure, you will be moved to a recovery area until most of the effects of the medication have worn off. Your caregiver will discuss the test results with you.  °· Only take over-the-counter or prescription medicines for pain, discomfort, or fever as directed by your caregiver.  °SEEK MEDICAL CARE IF:  °· You have increased bleeding from your wounds.  °· You see  redness, swelling, or have increasing pain in your wounds.  °· You have pus coming from your wound.  °· You have an oral temperature above 102° F (38.9° C).  °· You notice a bad smell coming from the wound or dressing.  °· You have severe pain with any motion of your knee.  °SEEK IMMEDIATE MEDICAL CARE IF:  °· You develop a rash.  °· You have difficulty breathing.  °· You have any allergic problems.  °FURTHER INSTRUCTIONS:  °· ICE to the affected knee every three hours for 30 minutes at a time and then as needed for pain and swelling.  Continue to use ice on the knee for pain and swelling from surgery. You may notice swelling that will progress down to the foot and ankle.  This is normal after surgery.  Elevate the leg when you are not up walking on it.   ° °DIET °You may resume your previous home diet once your are discharged from the hospital. ° °DRESSING / WOUND CARE / SHOWERING °You may start showering two days after being discharged home but do not submerge the incisions under water.  °Change dressing 48 hours after the procedure and then cover the small incisions with band aids until your follow up visit. °Change the surgical dressings daily and reapply a dry dressing each time.  ° °ACTIVITY °Walk with your walker as instructed. °Use walker as long as suggested by your caregivers. °Avoid periods of inactivity such as sitting longer than an hour when not asleep. This helps prevent blood clots.  °You may resume a sexual relationship in one month or when given the OK by your doctor.  °You may return to   work once you are cleared by your doctor.  °Do not drive a car for 6 weeks or until released by you surgeon.  °Do not drive while taking narcotics. ° °WEIGHT BEARING AS TOLERATED ° °POSTOPERATIVE CONSTIPATION PROTOCOL °Constipation - defined medically as fewer than three stools per week and severe constipation as less than one stool per week. ° °One of the most common issues patients have following surgery is  constipation.  Even if you have a regular bowel pattern at home, your normal regimen is likely to be disrupted due to multiple reasons following surgery.  Combination of anesthesia, postoperative narcotics, change in appetite and fluid intake all can affect your bowels.  In order to avoid complications following surgery, here are some recommendations in order to help you during your recovery period. ° °Colace (docusate) - Pick up an over-the-counter form of Colace or another stool softener and take twice a day as long as you are requiring postoperative pain medications.  Take with a full glass of water daily.  If you experience loose stools or diarrhea, hold the colace until you stool forms back up.  If your symptoms do not get better within 1 week or if they get worse, check with your doctor. ° °Dulcolax (bisacodyl) - Pick up over-the-counter and take as directed by the product packaging as needed to assist with the movement of your bowels.  Take with a full glass of water.  Use this product as needed if not relieved by Colace only.  ° °MiraLax (polyethylene glycol) - Pick up over-the-counter to have on hand.  MiraLax is a solution that will increase the amount of water in your bowels to assist with bowel movements.  Take as directed and can mix with a glass of water, juice, soda, coffee, or tea.  Take if you go more than two days without a movement. °Do not use MiraLax more than once per day. Call your doctor if you are still constipated or irregular after using this medication for 7 days in a row. ° °If you continue to have problems with postoperative constipation, please contact the office for further assistance and recommendations.  If you experience "the worst abdominal pain ever" or develop nausea or vomiting, please contact the office immediatly for further recommendations for treatment. ° °ITCHING ° If you experience itching with your medications, try taking only a single pain pill, or even half a pain pill  at a time.  You can also use Benadryl over the counter for itching or also to help with sleep.  ° °TED HOSE STOCKINGS °Wear the elastic stockings on both legs for three weeks following surgery during the day but you may remove then at night for sleeping. ° °MEDICATIONS °See your medication summary on the “After Visit Summary” that the nursing staff will review with you prior to discharge.  You may have some home medications which will be placed on hold until you complete the course of blood thinner medication.  It is important for you to complete the blood thinner medication as prescribed by your surgeon.  Continue your approved medications as instructed at time of discharge. °Do not drive while taking narcotics.  ° °PRECAUTIONS °If you experience chest pain or shortness of breath - call 911 immediately for transfer to the hospital emergency department.  °If you develop a fever greater that 101 F, purulent drainage from wound, increased redness or drainage from wound, foul odor from the wound/dressing, or calf pain - CONTACT YOUR SURGEON.   °                                                °  FOLLOW-UP APPOINTMENTS Make sure you keep all of your appointments after your operation with your surgeon and caregivers. You should call the office at [phone]  and make an appointment for approximately one week after the date of your surgery or on the date instructed by your surgeon outlined in the "After Visit Summary".  RANGE OF MOTION AND STRENGTHENING EXERCISES  Rehabilitation of the knee is important following a knee injury or an operation. After just a few days of immobilization, the muscles of the thigh which control the knee become weakened and shrink (atrophy). Knee exercises are designed to build up the tone and strength of the thigh muscles and to improve knee motion. Often times heat used for twenty to thirty minutes before working out will loosen up your tissues and help with improving the range of motion  but do not use heat for the first two weeks following surgery. These exercises can be done on a training (exercise) mat, on the floor, on a table or on a bed. Use what ever works the best and is most comfortable for you Knee exercises include:  QUAD STRENGTHENING EXERCISES Strengthening Quadriceps Sets  Tighten muscles on top of thigh by pushing knees down into floor or table. Hold for 20 seconds. Repeat 10 times. Do 2 sessions per day.     Strengthening Terminal Knee Extension  With knee bent over bolster, straighten knee by tightening muscle on top of thigh. Be sure to keep bottom of knee on bolster. Hold for 20 seconds. Repeat 10 times. Do 2 sessions per day.   Straight Leg with Bent Knee  Lie on back with opposite leg bent. Keep involved knee slightly bent at knee and raise leg 4-6". Hold for 10 seconds. Repeat 20 times per set. Do 2 sets per session. Do 2 sessions per day.  Post Anesthesia Home Care Instructions  Activity: Get plenty of rest for the remainder of the day. A responsible adult should stay with you for 24 hours following the procedure.  For the next 24 hours, DO NOT: -Drive a car -Paediatric nurse -Drink alcoholic beverages -Take any medication unless instructed by your physician -Make any legal decisions or sign important papers.  Meals: Start with liquid foods such as gelatin or soup. Progress to regular foods as tolerated. Avoid greasy, spicy, heavy foods. If nausea and/or vomiting occur, drink only clear liquids until the nausea and/or vomiting subsides. Call your physician if vomiting continues.  Special Instructions/Symptoms: Your throat may feel dry or sore from the anesthesia or the breathing tube placed in your throat during surgery. If this causes discomfort, gargle with warm salt water. The discomfort should disappear within 24 hours.  If you had a scopolamine patch placed behind your ear for the management of post- operative nausea and/or  vomiting:  1. The medication in the patch is effective for 72 hours, after which it should be removed.  Wrap patch in a tissue and discard in the trash. Wash hands thoroughly with soap and water. 2. You may remove the patch earlier than 72 hours if you experience unpleasant side effects which may include dry mouth, dizziness or visual disturbances. 3. Avoid touching the patch. Wash your hands with soap and water after contact with the patch.

## 2016-02-14 NOTE — Transfer of Care (Signed)
Immediate Anesthesia Transfer of Care Note  Patient: Micheal [name]  Procedure(s) Performed: Procedure(s): ARTHROSCOPY KNEE WTH SYNOVECTOMY (Left)  Patient Location: PACU  Anesthesia Type:General  Level of Consciousness: sedated  Airway & Oxygen Therapy: Patient Spontanous Breathing and Patient connected to nasal cannula oxygen  Post-op Assessment: Report given to RN  Post vital signs: Reviewed and stable  Last Vitals: 152/91, 60, 15, 99% Filed Vitals:   02/14/16 1112  BP: 124/75  Pulse: 67  Temp: 37.1 C  Resp: 16    Last Pain: There were no vitals filed for this visit.    Patients Stated Pain Goal: 7 ([id number])  Complications: No apparent anesthesia complications

## 2016-02-14 NOTE — Anesthesia Postprocedure Evaluation (Signed)
Anesthesia Post Note  Patient: Micheal [name]  Procedure(s) Performed: Procedure(s) (LRB): ARTHROSCOPY KNEE WTH SYNOVECTOMY (Left)  Patient location during evaluation: PACU Anesthesia Type: General Level of consciousness: awake and alert Pain management: pain level controlled Vital Signs Assessment: post-procedure vital signs reviewed and stable Respiratory status: spontaneous breathing, nonlabored ventilation, respiratory function stable and patient connected to nasal cannula oxygen Cardiovascular status: blood pressure returned to baseline and stable Postop Assessment: no signs of nausea or vomiting Anesthetic complications: no    Last Vitals:  Filed Vitals:   02/14/16 1430 02/14/16 1445  BP: 134/69 130/68  Pulse: 61   Temp:  36.9 C  Resp: 10     Last Pain:  Filed Vitals:   02/14/16 1457  PainSc: 0-No pain                 [name] J

## 2016-02-14 NOTE — H&P (Signed)
CC- Micheal [name] is a 70 y.o. male who presents with left knee pain.  HPI- . Knee Pain: Patient presents with knee pain involving the  left knee. Onset of the symptoms was several months ago. Inciting event: none known. Current symptoms include crepitus sensation, foreign body sensation and popping sensation. Pain is aggravated by going up and down stairs, rising after sitting and squatting.  Patient has had prior knee problems. Evaluation to date: plain films: normal. Treatment to date: rest. He had a left TKA in 2009 and has developed painful popping in the knee. He had a similar presentation approximately 2 years ago and got better after a left knee scope and synovectomy. Symptoms recurred in the past 2 months and he presents now for repeat arthroscopy  Past Medical History  Diagnosis Date  . Hyperlipidemia   . Hypertension   . Anxiety   . Benign essential tremor     hands  . History of colon polyps   . Sigmoid diverticulosis   . Urge urinary incontinence   . History of kidney stones   . History of prostate cancer urologist-  dr Alinda Money-  currently PSA nondetectable 10/ 2016    s/p  radial prostatectomy w/ nerve sparing 07-17-2007/   stage T1c,  Gleason 3+3=6,  PSA 6.33  . Osteoarthritis     knees  . H/O balanitis     recurrent intermittant-- uses diprolene cream prn  . Synovial hypertrophy of left knee   . GERD (gastroesophageal reflux disease)   . Nocturia   . Mild obstructive sleep apnea     uses mouth guard only    Past Surgical History  Procedure Laterality Date  . Knee arthroscopy Bilateral right 07-31-2009//  left   . Total knee arthroplasty Left 03-08-2008  . Robot assisted laparoscopic radical prostatectomy  07-17-2007  . Lumbar disc surgery  04-13-2002    left  L4 -- L5  . Tonsillectomy  as child  . Colonoscopy w/ polypectomy  06-21-2014  . Cardiovascular stress test  08-04-2014    normal perfusion nuclear study/  normal LV function and wall motion , ef 54%  .  Knee arthroscopy Left 12/08/2014    Procedure: ARTHROSCOPY LEFT KNEE WITH SYNOVECTOMY;  Surgeon: Gaynelle Arabian, MD;  Location: [facility]  Service: Orthopedics;  Laterality: Left;    Prior to Admission medications   Medication Sig Start Date End Date Taking? Authorizing Provider  acetaminophen (TYLENOL) 500 MG tablet Take 1,000 mg by mouth every 6 (six) hours as needed.   Yes Historical Provider, MD  ALPRAZolam Duanne Moron) 0.5 MG tablet Take 1 tablet (0.5 mg total) by mouth every 8 (eight) hours as needed for anxiety. 1/2 to 1 tab by mouth as needed for anxiety 10/26/13  Yes [name], MD  aspirin EC 81 MG tablet Take 81 mg by mouth every morning.   Yes Historical Provider, MD  atorvastatin (LIPITOR) 20 MG tablet Take 1 tablet (20 mg total) by mouth daily at 6 PM. 10/31/15  Yes [name], MD  Calcium Carbonate Antacid (ALKA-SELTZER ANTACID PO) Take by mouth as needed.   Yes Historical Provider, MD  ibuprofen (ADVIL,MOTRIN) 200 MG tablet Take 200 mg by mouth every 6 (six) hours as needed.   Yes Historical Provider, MD  methocarbamol (ROBAXIN) 500 MG tablet Take 1 tablet (500 mg total) by mouth 4 (four) times daily. As needed for muscle spasm 12/08/14  Yes Gaynelle Arabian, MD  Multiple Vitamin (MULTIVITAMIN) tablet Take 1  tablet by mouth every morning.   Yes Historical Provider, MD  olmesartan (BENICAR) 20 MG tablet Take 1 tablet (20 mg total) by mouth daily. Patient taking differently: Take 20 mg by mouth every evening.  10/31/15  Yes [name], MD  Probiotic Product (ALIGN PO) Take by mouth every morning.    Yes Historical Provider, MD  sertraline (ZOLOFT) 50 MG tablet Take 1 tablet (50 mg total) by mouth daily. Patient taking differently: Take 50 mg by mouth at bedtime.  10/31/15  Yes [name], MD   KNEE EXAM antalgic gait,no effusion, negative drawer sign, collateral ligaments intact, crepitus on range of motion  Physical Examination: General appearance -  alert, well appearing, and in no distress Mental status - alert, oriented to person, place, and time Chest - clear to auscultation, no wheezes, rales or rhonchi, symmetric air entry Heart - normal rate, regular rhythm, normal S1, S2, no murmurs, rubs, clicks or gallops Abdomen - soft, nontender, nondistended, no masses or organomegaly Neurological - alert, oriented, normal speech, no focal findings or movement disorder noted    Asessment/Plan--- Left knee hypertrophic synovitis- - Plan left knee arthroscopy with synovectomy. Procedure risks and potential comps discussed with patient who elects to proceed. Goals are decreased pain and increased function with a high likelihood of achieving both

## 2016-02-14 NOTE — Anesthesia Procedure Notes (Signed)
Procedure Name: LMA Insertion Date/Time: 02/14/2016 1:09 PM Performed by: Bethena Roys T Pre-anesthesia Checklist: Patient identified, Emergency Drugs available, Suction available and Patient being monitored Patient Re-evaluated:Patient Re-evaluated prior to inductionOxygen Delivery Method: Circle System Utilized Preoxygenation: Pre-oxygenation with 100% oxygen Intubation Type: IV induction Ventilation: Mask ventilation without difficulty LMA: LMA inserted LMA Size: 5.0 Number of attempts: 1 Airway Equipment and Method: Bite block Placement Confirmation: positive ETCO2 Dental Injury: Teeth and Oropharynx as per pre-operative assessment

## 2016-02-15 NOTE — Op Note (Signed)
Micheal, [name] NO.:  [id number]  MEDICAL RECORD NO.:  [id number]  LOCATION:                                 FACILITY:  PHYSICIAN:  Gaynelle Arabian, M.D.         DATE OF BIRTH:  DATE OF PROCEDURE:  02/14/2016 DATE OF DISCHARGE:                              OPERATIVE REPORT   PREOPERATIVE DIAGNOSIS:  Left knee hypertrophic synovitis.  POSTOPERATIVE DIAGNOSIS:  Left knee hypertrophic synovitis.  PROCEDURE:  Left knee arthroscopy and synovectomy.  SURGEON:  Gaynelle Arabian, M.D.  ASSISTANT:  No assistant.  ANESTHESIA:  General.  ESTIMATED BLOOD LOSS:  Minimal.  DRAINS:  None.  COMPLICATIONS:  None.  CONDITION:  Stable to recovery.  BRIEF CLINICAL NOTE:  [name] is a 70 year old male with history of left total knee arthroplasty approximately 7 years ago.  He had hypertrophic synovitis about a year and a half ago, had an arthroscopy and synovectomy and did well.  Recently, he started developed painful popping again.  This is similar to what he experienced before.  He had excellent resolution of symptoms with the last procedure and presents now for another arthroscopy and synovectomy.  PROCEDURE IN DETAIL:  After successful administration of general anesthetic, a tourniquet was placed high on the left thigh and left lower extremity, prepped and draped in usual sterile fashion.  Standard superomedial and inferolateral incisions were made.  Inflow cannula passed superomedial.  Camera passed inferolateral.  Arthroscopic visualization proceeds.  There was a large amount of hypertrophic synovial tissue at the junction of the quadriceps tendon and patella.  A superolateral portal was created with an 11-blade and then using combination of shaver and ArthroCare, this tissue was debrided back to normal-appearing tissue.  There was also some hypertrophic tissue in the lateral gutter.  Inferomedially in the area where he was most tender, I also noticed  hypertrophic tissue impinging on the prosthetic.  I used the combination of shaver and the ArthroCare to eliminate that tissue. Joint was again inspected and no other abnormal tissue noted.  There was no other tissue that potentially could have led to impingement. Arthroscopic equipment was then removed from the lateral portals, which were closed with interrupted 4-0 nylon.  A 20 mL of 0.25% Marcaine with epinephrine was injected through an inflow cannula, then that was removed and that portal was closed with nylon.  Incision was cleaned and dried, and a bulky sterile dressing applied.  He was then awakened and transported to recovery in stable condition.     Gaynelle Arabian, M.D.     FA/MEDQ  D:  02/14/2016  T:  02/15/2016  Job:  [id number]

## 2016-02-16 ENCOUNTER — Encounter (HOSPITAL_BASED_OUTPATIENT_CLINIC_OR_DEPARTMENT_OTHER): Payer: Self-pay | Admitting: Orthopedic Surgery

## 2016-02-21 DIAGNOSIS — Z96652 Presence of left artificial knee joint: Secondary | ICD-10-CM | POA: Diagnosis not present

## 2016-02-21 DIAGNOSIS — Z471 Aftercare following joint replacement surgery: Secondary | ICD-10-CM | POA: Diagnosis not present

## 2016-02-26 ENCOUNTER — Ambulatory Visit: Payer: Self-pay | Admitting: Orthopedic Surgery

## 2016-03-13 ENCOUNTER — Encounter: Payer: Self-pay | Admitting: Family Medicine

## 2016-03-13 DIAGNOSIS — Z4789 Encounter for other orthopedic aftercare: Secondary | ICD-10-CM | POA: Diagnosis not present

## 2016-03-14 NOTE — Telephone Encounter (Signed)
Last OV was his AMW visit 11/04/15 EKG was performed on 02/14/2016 Please advise. Does this office typically send [place] a clearance form to complete?

## 2016-03-24 ENCOUNTER — Emergency Department (HOSPITAL_COMMUNITY)
Admission: EM | Admit: 2016-03-24 | Discharge: 2016-03-24 | Disposition: A | Discharge status: Home / Self Care | Payer: Medicare Other | Attending: Emergency Medicine | Admitting: Emergency Medicine

## 2016-03-24 ENCOUNTER — Emergency Department (HOSPITAL_COMMUNITY): Payer: Medicare Other

## 2016-03-24 ENCOUNTER — Encounter (HOSPITAL_COMMUNITY): Payer: Self-pay

## 2016-03-24 DIAGNOSIS — Z96659 Presence of unspecified artificial knee joint: Secondary | ICD-10-CM | POA: Diagnosis not present

## 2016-03-24 DIAGNOSIS — R10817 Generalized abdominal tenderness: Secondary | ICD-10-CM | POA: Diagnosis present

## 2016-03-24 DIAGNOSIS — Z791 Long term (current) use of non-steroidal anti-inflammatories (NSAID): Secondary | ICD-10-CM | POA: Diagnosis not present

## 2016-03-24 DIAGNOSIS — Z8546 Personal history of malignant neoplasm of prostate: Secondary | ICD-10-CM | POA: Insufficient documentation

## 2016-03-24 DIAGNOSIS — Z7982 Long term (current) use of aspirin: Secondary | ICD-10-CM | POA: Diagnosis not present

## 2016-03-24 DIAGNOSIS — I10 Essential (primary) hypertension: Secondary | ICD-10-CM | POA: Diagnosis not present

## 2016-03-24 DIAGNOSIS — K5732 Diverticulitis of large intestine without perforation or abscess without bleeding: Secondary | ICD-10-CM | POA: Diagnosis not present

## 2016-03-24 DIAGNOSIS — K5900 Constipation, unspecified: Secondary | ICD-10-CM | POA: Diagnosis not present

## 2016-03-24 DIAGNOSIS — Z79899 Other long term (current) drug therapy: Secondary | ICD-10-CM | POA: Diagnosis not present

## 2016-03-24 DIAGNOSIS — E785 Hyperlipidemia, unspecified: Secondary | ICD-10-CM | POA: Diagnosis not present

## 2016-03-24 LAB — CBC WITH DIFFERENTIAL/PLATELET
Basophils Absolute: 0.1 10*3/uL (ref 0.0–0.1)
Basophils Relative: 0 %
Eosinophils Absolute: 0.2 10*3/uL (ref 0.0–0.7)
Eosinophils Relative: 1 %
HEMATOCRIT: 39.3 % (ref 39.0–52.0)
HEMOGLOBIN: 13.7 g/dL (ref 13.0–17.0)
LYMPHS ABS: 1.7 10*3/uL (ref 0.7–4.0)
LYMPHS PCT: 14 %
MCH: 32.3 pg (ref 26.0–34.0)
MCHC: 34.9 g/dL (ref 30.0–36.0)
MCV: 92.7 fL (ref 78.0–100.0)
MONO ABS: 1.2 10*3/uL — AB (ref 0.1–1.0)
Monocytes Relative: 10 %
NEUTROS ABS: 9.1 10*3/uL — AB (ref 1.7–7.7)
NEUTROS PCT: 75 %
Platelets: 226 10*3/uL (ref 150–400)
RBC: 4.24 MIL/uL (ref 4.22–5.81)
RDW: 13 % (ref 11.5–15.5)
WBC: 12.2 10*3/uL — ABNORMAL HIGH (ref 4.0–10.5)

## 2016-03-24 LAB — URINALYSIS, ROUTINE W REFLEX MICROSCOPIC
BILIRUBIN URINE: NEGATIVE
Glucose, UA: NEGATIVE mg/dL
HGB URINE DIPSTICK: NEGATIVE
KETONES UR: NEGATIVE mg/dL
Leukocytes, UA: NEGATIVE
Nitrite: NEGATIVE
PROTEIN: NEGATIVE mg/dL
Specific Gravity, Urine: 1.015 (ref 1.005–1.030)
pH: 5.5 (ref 5.0–8.0)

## 2016-03-24 LAB — I-STAT CHEM 8, ED
BUN: 15 mg/dL (ref 6–20)
CALCIUM ION: 1.13 mmol/L (ref 1.12–1.23)
Chloride: 104 mmol/L (ref 101–111)
Creatinine, Ser: 0.8 mg/dL (ref 0.61–1.24)
Glucose, Bld: 116 mg/dL — ABNORMAL HIGH (ref 65–99)
HCT: 41 % (ref 39.0–52.0)
Hemoglobin: 13.9 g/dL (ref 13.0–17.0)
Potassium: 4 mmol/L (ref 3.5–5.1)
SODIUM: 140 mmol/L (ref 135–145)
TCO2: 24 mmol/L (ref 0–100)

## 2016-03-24 LAB — HEPATIC FUNCTION PANEL
ALK PHOS: 74 U/L (ref 38–126)
ALT: 20 U/L (ref 17–63)
AST: 27 U/L (ref 15–41)
Albumin: 4.1 g/dL (ref 3.5–5.0)
BILIRUBIN DIRECT: 0.2 mg/dL (ref 0.1–0.5)
BILIRUBIN INDIRECT: 0.4 mg/dL (ref 0.3–0.9)
BILIRUBIN TOTAL: 0.6 mg/dL (ref 0.3–1.2)
TOTAL PROTEIN: 7.6 g/dL (ref 6.5–8.1)

## 2016-03-24 LAB — LIPASE, BLOOD: Lipase: 22 U/L (ref 11–51)

## 2016-03-24 MED ORDER — METRONIDAZOLE 500 MG PO TABS
500.0000 mg | ORAL_TABLET | Freq: Two times a day (BID) | ORAL | Status: DC
Start: 2016-03-24 — End: 2016-10-02

## 2016-03-24 MED ORDER — CIPROFLOXACIN HCL 500 MG PO TABS: 500.0000 mg | ORAL_TABLET | Freq: Two times a day (BID) | ORAL | Status: DC

## 2016-03-24 MED ORDER — IOPAMIDOL (ISOVUE-300) INJECTION 61%
100.0000 mL | Freq: Once | INTRAVENOUS | Status: AC | PRN
  Administered 2016-03-24: 100 mL via INTRAVENOUS

## 2016-03-24 NOTE — ED Provider Notes (Signed)
CSN: [id number]     Arrival date & time 03/24/16  0558 History   First MD Initiated Contact with Patient 03/24/16 [phone]     Chief Complaint  Patient presents with  . Abdominal Pain     (Consider location/radiation/quality/duration/timing/severity/associated sxs/prior Treatment) HPI   70 year old male with history of sigmoid diverticulosis, prostate cancer status post radical prostatectomy, GERD, hyperlipidemia presenting for evaluation of abdominal pain. Patient states for the past 2 days he has had increase constipation and abdominal pain. He describes abdominal pain as a cramping sensation that started in his low abdomen and radiated upward, and is episodic. Pain can be reproduced with palpation or with movement. Pain comes in waves, currently rates his pain as 4 out of 10. He also noticed that his stools for the past several days has been "ribbon shape" and not normal in consistency. He reportedly has been taking Norco as needed since the beginning of the week (5 days ago). He takes no more than 2-3 tablets a day. He also have been taking calcium, and multivitamin recently. He discontinued off his medication 2 days ago thinking that it may have contributed to his symptoms. He is scheduled to have a right knee replacement on July 17. Patient currently denies having fever, nausea, vomiting, chest pain, shortness of breath, back pain, dysuria, hematuria, hematochezia or melena. Denies any prior abdominal surgery aside from the prostatectomy. He also denies any abnormal weight changes, night sweats, or other B symptoms. He normally have a bowel movement daily. His last colonoscopy was 2 years ago and was noted to have some benign colon polyps.     Past Medical History  Diagnosis Date  . Hyperlipidemia   . Hypertension   . Anxiety   . Benign essential tremor     hands  . History of colon polyps   . Sigmoid diverticulosis   . Urge urinary incontinence   . History of kidney stones   . History  of prostate cancer urologist-  dr Alinda Money-  currently PSA nondetectable 10/ 2016    s/p  radial prostatectomy w/ nerve sparing 07-17-2007/   stage T1c,  Gleason 3+3=6,  PSA 6.33  . Osteoarthritis     knees  . H/O balanitis     recurrent intermittant-- uses diprolene cream prn  . Synovial hypertrophy of left knee   . GERD (gastroesophageal reflux disease)   . Nocturia   . Mild obstructive sleep apnea     uses mouth guard only   Past Surgical History  Procedure Laterality Date  . Knee arthroscopy Bilateral right 07-31-2009//  left   . Total knee arthroplasty Left 03-08-2008  . Robot assisted laparoscopic radical prostatectomy  07-17-2007  . Lumbar disc surgery  04-13-2002    left  L4 -- L5  . Tonsillectomy  as child  . Colonoscopy w/ polypectomy  06-21-2014  . Cardiovascular stress test  08-04-2014    normal perfusion nuclear study/  normal LV function and wall motion , ef 54%  . Knee arthroscopy Left 12/08/2014    Procedure: ARTHROSCOPY LEFT KNEE WITH SYNOVECTOMY;  Surgeon: Gaynelle Arabian, MD;  Location: [facility];  Service: Orthopedics;  Laterality: Left;  . Knee arthroscopy Left 02/14/2016    Procedure: ARTHROSCOPY KNEE WTH SYNOVECTOMY;  Surgeon: Gaynelle Arabian, MD;  Location: [facility];  Service: Orthopedics;  Laterality: Left;   Family History  Problem Relation Age of Onset  . Sudden death Mother   . Aneurysm Father   . Heart attack  grandfather   Social History  Substance Use Topics  . Smoking status: Never Smoker   . Smokeless tobacco: Never Used  . Alcohol Use: Yes     Comment: social    Review of Systems  All other systems reviewed and are negative.     Allergies  Review of patient's allergies indicates no known allergies.  Home Medications   Prior to Admission medications   Medication Sig Start Date End Date Taking? Authorizing Provider  acetaminophen (TYLENOL) 500 MG tablet Take 1,000 mg by mouth every 6 (six) hours as  needed for mild pain.     Historical Provider, MD  ALPRAZolam Duanne Moron) 0.5 MG tablet Take 1 tablet (0.5 mg total) by mouth every 8 (eight) hours as needed for anxiety. 1/2 to 1 tab by mouth as needed for anxiety 10/26/13   [name], MD  aspirin EC 81 MG tablet Take 81 mg by mouth every morning.    Historical Provider, MD  atorvastatin (LIPITOR) 20 MG tablet Take 1 tablet (20 mg total) by mouth daily at 6 PM. 10/31/15   [name], MD  Calcium Carbonate Antacid (ALKA-SELTZER ANTACID PO) Take 1 tablet by mouth daily as needed (heartburn).     Historical Provider, MD  HYDROcodone-acetaminophen (NORCO) 5-325 MG tablet Take 1-2 tablets by mouth every 4 (four) hours as needed for moderate pain. 02/14/16   Gaynelle Arabian, MD  ibuprofen (ADVIL,MOTRIN) 200 MG tablet Take 200 mg by mouth every 6 (six) hours as needed for moderate pain.     Historical Provider, MD  methocarbamol (ROBAXIN) 500 MG tablet Take 1 tablet (500 mg total) by mouth 4 (four) times daily. As needed for muscle spasm 12/08/14   Gaynelle Arabian, MD  Multiple Vitamin (MULTIVITAMIN) tablet Take 1 tablet by mouth every morning.    Historical Provider, MD  olmesartan (BENICAR) 20 MG tablet Take 1 tablet (20 mg total) by mouth every evening. 02/14/16   Gaynelle Arabian, MD  Probiotic Product (ALIGN PO) Take 1 tablet by mouth every morning.     Historical Provider, MD  sertraline (ZOLOFT) 50 MG tablet Take 1 tablet (50 mg total) by mouth at bedtime. 02/14/16   Gaynelle Arabian, MD   BP 127/80 mmHg  Pulse 78  Temp(Src) 99.1 F (37.3 C) (Oral)  Resp 20  Ht 5\' 10"  (1.778 m)  Wt 86.183 kg  BMI 27.26 kg/m2  SpO2 100% Physical Exam  Constitutional: He is oriented to person, place, and time. He appears well-developed and well-nourished. No distress.  HENT:  Head: Atraumatic.  Eyes: Conjunctivae are normal.  Neck: Neck supple.  Cardiovascular: Normal rate and regular rhythm.   Pulmonary/Chest: Effort normal and breath sounds normal.   Abdominal: Soft. Bowel sounds are normal. He exhibits no distension. There is tenderness (Diffuse abdominal tenderness without guarding or rebound tenderness. No hernia noted.). There is no rebound and no guarding.  Genitourinary:  Chaperone present during exam. Normal rectal tone, no obvious hemorrhoid, no stool in rectal vault, no enlarged prostate and no mass.  Neurological: He is alert and oriented to person, place, and time.  Skin: No rash noted.  Psychiatric: He has a normal mood and affect.  Nursing note and vitals reviewed.   ED Course  Procedures (including critical care time) Labs Review Labs Reviewed  CBC WITH DIFFERENTIAL/PLATELET - Abnormal; Notable for the following:    WBC 12.2 (*)    Neutro Abs 9.1 (*)    Monocytes Absolute 1.2 (*)    All other  components within normal limits  I-STAT CHEM 8, ED - Abnormal; Notable for the following:    Glucose, Bld 116 (*)    All other components within normal limits  LIPASE, BLOOD  HEPATIC FUNCTION PANEL  URINALYSIS, ROUTINE W REFLEX MICROSCOPIC (NOT AT [facility])  POC OCCULT BLOOD, ED    Imaging Review Ct Abdomen Pelvis W Contrast  03/24/2016  CLINICAL DATA:  Lower abdominal pain EXAM: CT ABDOMEN AND PELVIS WITH CONTRAST TECHNIQUE: Multidetector CT imaging of the abdomen and pelvis was performed using the standard protocol following bolus administration of intravenous contrast. CONTRAST:  169mL ISOVUE-300 IOPAMIDOL (ISOVUE-300) INJECTION 61% COMPARISON:  04/10/2007 FINDINGS: Calcified granuloma in the right lower lobe Diffuse hepatic steatosis. Calcified granulomata are present in the liver and spleen. Gallbladder, pancreas, and adrenal glands are within normal limits Kidneys are unremarkable. Previously described changes related to left ureteral obstruction have resolved. No ureteral calculi. There is wall thickening and inflammatory changes of a focal section of the sigmoid colon extending into the right lower quadrant. This is associated  with diverticuli and findings are most consistent with acute diverticulitis. There is no pneumatosis. There is no extraluminal bowel gas. There is no focal abscess formation. Normal appendix. Unremarkable appearance of the bladder.  Prostate is absent. No free-fluid.  No abnormal retroperitoneal adenopathy. Atherosclerotic calcification of the aorta. No vertebral compression deformity. Left hemi laminectomy defect at L3-4 and L4-5. 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 IMPRESSION: Acute diverticulitis of the sigmoid colon. No evidence of perforation or abscess. Electronically Signed   By: Marybelle Killings M.D.   On: 03/24/2016 08:18   Dg Abd Acute W/chest  03/24/2016  CLINICAL DATA:  Abdominal pain for 2 days and constipation EXAM: DG ABDOMEN ACUTE W/ 1V CHEST COMPARISON:  Chest radiograph July 30, 2014; abdominal radiograph October 14, 2013 FINDINGS: PA chest: There is slight atelectasis in the left base. No edema or consolidation. Heart size and pulmonary vascularity are normal. No adenopathy. Supine and upright abdomen: There is moderate stool throughout the colon. There is no appreciable bowel dilatation. There are scattered air-fluid levels in the right abdomen. No free air evident. There are small phleboliths in the pelvis. IMPRESSION: Scattered air-fluid levels. Question early ileus or enteritis. Obstruction is felt to be less likely. No free air. Moderate stool in the colon. Mild left lung base atelectasis. Lungs elsewhere clear. Electronically Signed   By: Lowella Grip III M.D.   On: 03/24/2016 08:04   I have personally reviewed and evaluated these images and lab results as part of my medical decision-making.   EKG Interpretation None      MDM   Final diagnoses:  Diverticulitis of large intestine without perforation or abscess without bleeding    BP 127/80 mmHg  Pulse 78  Temp(Src) 99.1 F (37.3 C) (Oral)  Resp 20  Ht 5\' 10"  (1.778 m)  Wt 86.183 kg  BMI 27.26 kg/m2  SpO2  100%   7:25 AM Patient here with complaints of constipation, last BM was 2 days ago. He does have mild generalized abdominal tenderness on exam. No history of SBO in the past. He has been taking Norco which may be attributed to his current complaint. He has also been taking Mirapex. Given history of prostate cancer, a no obvious stool impaction on digital rectal exam, plan to obtain abdominal and pelvic CT scan for further evaluation. He has a history of diverticulosis.  Pain medication offer, patient declined.  8:34 AM Mildly elevated WBC of 12.2. Labs  are otherwise reassuring. Abdominal and pelvis CT scan demonstrated evidence of acute diverticulitis without complication such as perforation or abscess. Patient is well-appearing, does not wants IV antibiotic and feels comfortable taking by mouth medication at home. Care discussed with Dr. Vanita Panda who will also evaluate patient. Anticipate discharge. Return precaution discussed. I also encouraged continue with Mirapex to help with his constipation.  Domenic Moras, PA-C 03/24/16 [id number]  Carmin Muskrat, MD 03/25/16 [phone]

## 2016-03-24 NOTE — ED Notes (Signed)
Patient states having lower mid abdominal pain that radiates to the right.  States pain began x2 days ago.  Patient states that has been unable to have a BM.  Patient states that abdomen is tender to touch.  Patient states that is to have knee replacement surgery on Monday July 17 and had been taking Norco for the pain.  Patient states that stopped taking Norco on Thursday due to thinking that the pain medication was causing the problem.  Patient rates pain 4/10.

## 2016-03-24 NOTE — Discharge Instructions (Signed)
Diverticulitis  Diverticulitis is when small pockets that have formed in your colon (large intestine) become infected or swollen.  HOME CARE  · Follow your doctor's instructions.  · Follow a special diet if told by your doctor.  · When you feel better, your doctor may tell you to change your diet. You may be told to eat a lot of fiber. Fruits and vegetables are good sources of fiber. Fiber makes it easier to poop (have bowel movements).  · Take supplements or probiotics as told by your doctor.  · Only take medicines as told by your doctor.  · Keep all follow-up visits with your doctor.  GET HELP IF:  · Your pain does not get better.  · You have a hard time eating food.  · You are not pooping like normal.  GET HELP RIGHT AWAY IF:  · Your pain gets worse.  · Your problems do not get better.  · Your problems suddenly get worse.  · You have a fever.  · You keep throwing up (vomiting).  · You have bloody or black, tarry poop (stool).  MAKE SURE YOU:   · Understand these instructions.  · Will watch your condition.  · Will get help right away if you are not doing well or get worse.     This information is not intended to replace advice given to you by your health care provider. Make sure you discuss any questions you have with your health care provider.     Document Released: 02/20/2008 Document Revised: 09/08/2013 Document Reviewed: 07/29/2013  Elsevier Interactive Patient Education ©2016 [employer]

## 2016-03-26 ENCOUNTER — Encounter (HOSPITAL_COMMUNITY)
Admission: RE | Admit: 2016-03-26 | Discharge: 2016-03-26 | Disposition: A | Discharge status: Home / Self Care | Payer: Medicare Other | Source: Ambulatory Visit | Attending: Orthopedic Surgery | Admitting: Orthopedic Surgery

## 2016-03-26 ENCOUNTER — Encounter (HOSPITAL_COMMUNITY): Payer: Self-pay | Admitting: *Deleted

## 2016-03-26 DIAGNOSIS — I10 Essential (primary) hypertension: Secondary | ICD-10-CM | POA: Insufficient documentation

## 2016-03-26 DIAGNOSIS — M1711 Unilateral primary osteoarthritis, right knee: Secondary | ICD-10-CM | POA: Insufficient documentation

## 2016-03-26 DIAGNOSIS — E785 Hyperlipidemia, unspecified: Secondary | ICD-10-CM | POA: Insufficient documentation

## 2016-03-26 DIAGNOSIS — Z01812 Encounter for preprocedural laboratory examination: Secondary | ICD-10-CM | POA: Insufficient documentation

## 2016-03-26 DIAGNOSIS — F419 Anxiety disorder, unspecified: Secondary | ICD-10-CM | POA: Diagnosis not present

## 2016-03-26 LAB — COMPREHENSIVE METABOLIC PANEL
ALBUMIN: 3.9 g/dL (ref 3.5–5.0)
ALT: 19 U/L (ref 17–63)
ANION GAP: 8 (ref 5–15)
AST: 29 U/L (ref 15–41)
Alkaline Phosphatase: 73 U/L (ref 38–126)
BILIRUBIN TOTAL: 0.7 mg/dL (ref 0.3–1.2)
BUN: 17 mg/dL (ref 6–20)
CO2: 26 mmol/L (ref 22–32)
Calcium: 9 mg/dL (ref 8.9–10.3)
Chloride: 104 mmol/L (ref 101–111)
Creatinine, Ser: 0.96 mg/dL (ref 0.61–1.24)
GFR calc Af Amer: >60 mL/min (ref >60)
GFR calc non Af Amer: >60 mL/min (ref >60)
GLUCOSE: 126 mg/dL — AB (ref 65–99)
POTASSIUM: 3.8 mmol/L (ref 3.5–5.1)
SODIUM: 138 mmol/L (ref 135–145)
TOTAL PROTEIN: 7.5 g/dL (ref 6.5–8.1)

## 2016-03-26 LAB — CBC
HCT: 39.3 % (ref 39.0–52.0)
Hemoglobin: 13.6 g/dL (ref 13.0–17.0)
MCH: 32 pg (ref 26.0–34.0)
MCHC: 34.6 g/dL (ref 30.0–36.0)
MCV: 92.5 fL (ref 78.0–100.0)
PLATELETS: 279 10*3/uL (ref 150–400)
RBC: 4.25 MIL/uL (ref 4.22–5.81)
RDW: 12.9 % (ref 11.5–15.5)
WBC: 7.4 10*3/uL (ref 4.0–10.5)

## 2016-03-26 LAB — URINE MICROSCOPIC-ADD ON

## 2016-03-26 LAB — URINALYSIS, ROUTINE W REFLEX MICROSCOPIC
BILIRUBIN URINE: NEGATIVE
GLUCOSE, UA: NEGATIVE mg/dL
Hgb urine dipstick: NEGATIVE
KETONES UR: NEGATIVE mg/dL
NITRITE: NEGATIVE
PH: 5.5 (ref 5.0–8.0)
Protein, ur: NEGATIVE mg/dL
Specific Gravity, Urine: 1.024 (ref 1.005–1.030)

## 2016-03-26 LAB — PROTIME-INR
INR: 1.08 (ref 0.00–1.49)
Prothrombin Time: 14.2 seconds (ref 11.6–15.2)

## 2016-03-26 LAB — SURGICAL PCR SCREEN
MRSA, PCR: NEGATIVE
STAPHYLOCOCCUS AUREUS: POSITIVE — AB

## 2016-03-26 LAB — APTT: APTT: 31 s (ref 24–37)

## 2016-03-26 NOTE — Progress Notes (Signed)
03/14/16- PRE-OPERATIVE CLEARANCE ON CHART FROM DR. Elease Hashimoto. 02/14/16-NOTED EKG IN epic. 03/24/16- NOTED IN EPIC-DG ABD. ACUTE W/CHEST.

## 2016-03-26 NOTE — Patient Instructions (Addendum)
Rivan Petteys Heinecke  03/26/2016   Your procedure is scheduled on: Monday 04/02/2016  Report to [facility]  Entrance take [place]  elevators to 3rd floor to  [place] at  1045 AM.  Call this number if you have problems the morning of surgery [phone]   Remember: ONLY 1 PERSON MAY GO WITH YOU TO SHORT STAY TO GET  READY MORNING OF [place].   Do not eat food  :After Midnight.  MAY HAVE CLEAR LIQUIDS FROM MIDNIGHT UP UNTIL 0745 AM THEN NOTHING UNTIL AFTER SURGERY!     Take these medicines the morning of surgery with A SIP OF WATER: Zoloft                                 You may not have any metal on your body including hair pins and              piercings  Do not wear jewelry, make-up, lotions, powders or perfumes, deodorant             Do not wear nail polish.  Do not shave  48 hours prior to surgery.              Men may shave face and neck.   Do not bring valuables to the hospital. [place].  Contacts, dentures or bridgework may not be worn into surgery.  Leave suitcase in the car. After surgery it may be brought to your room.                Please read over the following fact sheets you were given: _____________________________________________________________________             [facility] - Preparing for Surgery Before surgery, you can play an important role.  Because skin is not sterile, your skin needs to be as free of germs as possible.  You can reduce the number of germs on your skin by washing with CHG (chlorahexidine gluconate) soap before surgery.  CHG is an antiseptic cleaner which kills germs and bonds with the skin to continue killing germs even after washing. Please DO NOT use if you have an allergy to CHG or antibacterial soaps.  If your skin becomes reddened/irritated stop using the CHG and inform your nurse when you arrive at Short Stay. Do not shave (including legs and  underarms) for at least 48 hours prior to the first CHG shower.  You may shave your face/neck. Please follow these instructions carefully:  1.  Shower with CHG Soap the night before surgery and the  morning of Surgery.  2.  If you choose to wash your hair, wash your hair first as usual with your  normal  shampoo.  3.  After you shampoo, rinse your hair and body thoroughly to remove the  shampoo.                           4.  Use CHG as you would any other liquid soap.  You can apply chg directly  to the skin and wash  Gently with a scrungie or clean washcloth.  5.  Apply the CHG Soap to your body ONLY FROM THE NECK DOWN.   Do not use on face/ open                           Wound or open sores. Avoid contact with eyes, ears mouth and genitals (private parts).                       Wash face,  Genitals (private parts) with your normal soap.             6.  Wash thoroughly, paying special attention to the area where your surgery  will be performed.  7.  Thoroughly rinse your body with warm water from the neck down.  8.  DO NOT shower/wash with your normal soap after using and rinsing off  the CHG Soap.                9.  Pat yourself dry with a clean towel.            10.  Wear clean pajamas.            11.  Place clean sheets on your bed the night of your first shower and do not  sleep with pets. Day of Surgery : Do not apply any lotions/deodorants the morning of surgery.  Please wear clean clothes to the hospital/surgery center.  FAILURE TO FOLLOW THESE INSTRUCTIONS MAY RESULT IN THE CANCELLATION OF YOUR SURGERY PATIENT SIGNATURE_________________________________  NURSE SIGNATURE__________________________________  ________________________________________________________________________   Adam Phenix  An incentive spirometer is a tool that can help keep your lungs clear and active. This tool measures how well you are filling your lungs with each breath. Taking  long deep breaths may help reverse or decrease the chance of developing breathing (pulmonary) problems (especially infection) following:  A long period of time when you are unable to move or be active. BEFORE THE PROCEDURE   If the spirometer includes an indicator to show your best effort, your nurse or respiratory therapist will set it to a desired goal.  If possible, sit up straight or lean slightly forward. Try not to slouch.  Hold the incentive spirometer in an upright position. INSTRUCTIONS FOR USE   Sit on the edge of your bed if possible, or sit up as far as you can in bed or on a chair.  Hold the incentive spirometer in an upright position.  Breathe out normally.  Place the mouthpiece in your mouth and seal your lips tightly around it.  Breathe in slowly and as deeply as possible, raising the piston or the ball toward the top of the column.  Hold your breath for 3-5 seconds or for as long as possible. Allow the piston or ball to fall to the bottom of the column.  Remove the mouthpiece from your mouth and breathe out normally.  Rest for a few seconds and repeat Steps 1 through 7 at least 10 times every 1-2 hours when you are awake. Take your time and take a few normal breaths between deep breaths.  The spirometer may include an indicator to show your best effort. Use the indicator as a goal to work toward during each repetition.  After each set of 10 deep breaths, practice coughing to be sure your lungs are clear. If you have an incision (the cut made at the time of surgery),  support your incision when coughing by placing a pillow or rolled up towels firmly against it. Once you are able to get out of bed, walk around indoors and cough well. You may stop using the incentive spirometer when instructed by your caregiver.  RISKS AND COMPLICATIONS  Take your time so you do not get dizzy or light-headed.  If you are in pain, you may need to take or ask for pain medication before  doing incentive spirometry. It is harder to take a deep breath if you are having pain. AFTER USE  Rest and breathe slowly and easily.  It can be helpful to keep track of a log of your progress. Your caregiver can provide you with a simple table to help with this. If you are using the spirometer at home, follow these instructions: [place] IF:   You are having difficultly using the spirometer.  You have trouble using the spirometer as often as instructed.  Your pain medication is not giving enough relief while using the spirometer.  You develop fever of 100.5 F (38.1 C) or higher. SEEK IMMEDIATE MEDICAL CARE IF:   You cough up bloody sputum that had not been present before.  You develop fever of 102 F (38.9 C) or greater.  You develop worsening pain at or near the incision site. MAKE SURE YOU:   Understand these instructions.  Will watch your condition.  Will get help right away if you are not doing well or get worse. Document Released: 01/14/2007 Document Revised: 11/26/2011 Document Reviewed: 03/17/2007 ExitCare Patient Information 2014 ExitCare, [place].   ________________________________________________________________________  WHAT IS A BLOOD TRANSFUSION? Blood Transfusion Information  A transfusion is the replacement of blood or some of its parts. Blood is made up of multiple cells which provide different functions.  Red blood cells carry oxygen and are used for blood loss replacement.  White blood cells fight against infection.  Platelets control bleeding.  Plasma helps clot blood.  Other blood products are available for specialized needs, such as hemophilia or other clotting disorders. BEFORE THE TRANSFUSION  Who gives blood for transfusions?   Healthy volunteers who are fully evaluated to make sure their blood is safe. This is blood bank blood. Transfusion therapy is the safest it has ever been in the practice of medicine. Before blood is taken  from a donor, a complete history is taken to make sure that person has no history of diseases nor engages in risky social behavior (examples are intravenous drug use or sexual activity with multiple partners). The donor's travel history is screened to minimize risk of transmitting infections, such as malaria. The donated blood is tested for signs of infectious diseases, such as HIV and hepatitis. The blood is then tested to be sure it is compatible with you in order to minimize the chance of a transfusion reaction. If you or a relative donates blood, this is often done in anticipation of surgery and is not appropriate for emergency situations. It takes many days to process the donated blood. RISKS AND COMPLICATIONS Although transfusion therapy is very safe and saves many lives, the main dangers of transfusion include:   Getting an infectious disease.  Developing a transfusion reaction. This is an allergic reaction to something in the blood you were given. Every precaution is taken to prevent this. The decision to have a blood transfusion has been considered carefully by your caregiver before blood is given. Blood is not given unless the benefits outweigh the risks. AFTER THE TRANSFUSION  Right after receiving a blood transfusion, you will usually feel much better and more energetic. This is especially true if your red blood cells have gotten low (anemic). The transfusion raises the level of the red blood cells which carry oxygen, and this usually causes an energy increase.  The nurse administering the transfusion will monitor you carefully for complications. HOME CARE INSTRUCTIONS  No special instructions are needed after a transfusion. You may find your energy is better. Speak with your caregiver about any limitations on activity for underlying diseases you may have. SEEK MEDICAL CARE IF:   Your condition is not improving after your transfusion.  You develop redness or irritation at the  intravenous (IV) site. SEEK IMMEDIATE MEDICAL CARE IF:  Any of the following symptoms occur over the next 12 hours:  Shaking chills.  You have a temperature by mouth above 102 F (38.9 C), not controlled by medicine.  Chest, back, or muscle pain.  People around you feel you are not acting correctly or are confused.  Shortness of breath or difficulty breathing.  Dizziness and fainting.  You get a rash or develop hives.  You have a decrease in urine output.  Your urine turns a dark color or changes to pink, red, or brown. Any of the following symptoms occur over the next 10 days:  You have a temperature by mouth above 102 F (38.9 C), not controlled by medicine.  Shortness of breath.  Weakness after normal activity.  The white part of the eye turns yellow (jaundice).  You have a decrease in the amount of urine or are urinating less often.  Your urine turns a dark color or changes to pink, red, or brown. Document Released: 08/31/2000 Document Revised: 11/26/2011 Document Reviewed: 04/19/2008 ExitCare Patient Information 2014 ExitCare, [place].  _______________________________________________________________________   CLEAR LIQUID DIET   Foods Allowed                                                                     Foods Excluded  Coffee and tea, regular and decaf                             liquids that you cannot  Plain Jell-O in any flavor                                             see through such as: Fruit ices (not with fruit pulp)                                     milk, soups, orange juice  Iced Popsicles                                    All solid food Carbonated beverages, regular and diet  Cranberry, grape and apple juices Sports drinks like Gatorade Lightly seasoned clear broth or consume(fat free) Sugar, honey syrup  Sample Menu Breakfast                                Lunch                                      Supper Cranberry juice                    Beef broth                            Chicken broth Jell-O                                     Grape juice                           Apple juice Coffee or tea                        Jell-O                                      Popsicle                                                Coffee or tea                        Coffee or tea  _____________________________________________________________________

## 2016-03-27 DIAGNOSIS — M50821 Other cervical disc disorders at C4-C5 level: Secondary | ICD-10-CM | POA: Diagnosis not present

## 2016-03-27 DIAGNOSIS — G894 Chronic pain syndrome: Secondary | ICD-10-CM | POA: Diagnosis not present

## 2016-03-27 DIAGNOSIS — M50822 Other cervical disc disorders at C5-C6 level: Secondary | ICD-10-CM | POA: Diagnosis not present

## 2016-03-27 DIAGNOSIS — M50823 Other cervical disc disorders at C6-C7 level: Secondary | ICD-10-CM | POA: Diagnosis not present

## 2016-03-28 NOTE — Progress Notes (Signed)
03/27/2016- note from Dr. Cristina Gong on chart.

## 2016-04-01 ENCOUNTER — Ambulatory Visit: Payer: Self-pay | Admitting: Orthopedic Surgery

## 2016-04-01 NOTE — H&P (Signed)
Micheal [name] DOB: [birth year] Married / Language: English / Race: White Male Date of Admission:  04/02/2016 CC:  Right Knen pain History of Present Illness The patient is a 70 year old male who comes in  for a preoperative History and Physical. The patient is scheduled for a right total knee arthroplasty to be performed by Dr. Dione Plover. Aluisio, MD at [facility] on 04/02/2016. The patient is a 70 year old male who presented for follow up of their right knee. The patient is being followed for their right knee OA and left TKA. Symptoms reported include: pain (only on the right). and report their pain level to be mild to moderate (if he has had a busy day ). The following medication has been used for pain control: Tylenol (prn). The left knee is oding okay but the right knee continues to be a problem. He is now reached a point where he would like to get the knee replaced. They have been treated conservatively in the past for the above stated problem and despite conservative measures, they continue to have progressive pain and severe functional limitations and dysfunction. They have failed non-operative management including home exercise, medications. It is felt that they would benefit from undergoing total joint replacement. Risks and benefits of the procedure have been discussed with the patient and they elect to proceed with surgery. There are no active contraindications to surgery such as ongoing infection or rapidly progressive neurological disease. Please note that the patient had a recent ED visit and was diagnosed with diverticulitis.  He has been seen by his gastroenterologist, Dr. Cristina Gong, and felt that he would be fine to proceed with surgery as planned.   Problem List/Past Medical Chronic pain of right knee (M25.561)  Status post total left knee replacement [id number])  Aftercare following left knee joint replacement surgery (Z47.1)  Primary osteoarthritis of right knee (M17.11)   Anxiety Disorder  High blood pressure  Kidney Stone  Prostate Cancer  Prostate Disease  Sleep Apnea  Hyperlipidemia  Diverticulitis Of Colon  July 2017  Allergies No Known Drug Allergies   Family History Cancer  grandmother mothers side Congestive Heart Failure  father and grandfather fathers side  Social History Alcohol use  current drinker; drinks wine; only occasionally per week Children  2 Current work status  working full time Drug/Alcohol Rehab (Currently)  no Drug/Alcohol Rehab (Previously)  no Exercise  Exercises rarely Illicit drug use  no Living situation  live with spouse Marital status  married Number of flights of stairs before winded  2-3 Pain Contract  no Tobacco / smoke exposure  no Tobacco use  never smoker  Medication History  Tylenol (500MG  Capsule, Oral as needed) Active. Lipitor (10MG  Tablet, Oral daily) Active. Benicar (5MG  Tablet, Oral) Active. Aspirin EC (81MG  Tablet DR, Oral) Active. Sertraline HCl (Oral) Specific strength unknown - Active. Probiotic Active. Calcium Supplement Active. Multivitamin Active.  Past Surgical History Arthroscopy of Knee  bilateral Prostatectomy; Abdominal  Spinal Surgery  Straighten Nasal Septum  Tonsillectomy  Total Knee Replacement  left L4 Discectomy  Date: 2003.  Review of Systems General Not Present- Chills, Fatigue, Fever, Memory Loss, Night Sweats, Weight Gain and Weight Loss. Skin Not Present- Eczema, Hives, Itching, Lesions and Rash. HEENT Not Present- Dentures, Double Vision, Headache, Hearing Loss, Tinnitus and Visual Loss. Respiratory Not Present- Allergies, Chronic Cough, Coughing up blood, Shortness of breath at rest and Shortness of breath with exertion. Cardiovascular Not Present- Chest Pain, Difficulty Breathing Lying Down,  Murmur, Palpitations, Racing/skipping heartbeats and Swelling. Gastrointestinal Not Present- Abdominal Pain, Bloody Stool,  Constipation, Diarrhea, Difficulty Swallowing, Heartburn, Jaundice, Loss of appetitie, Nausea and Vomiting. Male Genitourinary Not Present- Blood in Urine, Discharge, Flank Pain, Incontinence, Painful Urination, Urgency, Urinary frequency, Urinary Retention, Urinating at Night and Weak urinary stream. Musculoskeletal Present- Joint Pain. Not Present- Back Pain, Joint Swelling, Morning Stiffness, Muscle Pain, Muscle Weakness and Spasms. Neurological Not Present- Blackout spells, Difficulty with balance, Dizziness, Paralysis, Tremor and Weakness. Psychiatric Not Present- Insomnia.  Vitals  Weight: 192 lb Height: 70in Weight was reported by patient. Height was reported by patient. Body Surface Area: 2.05 m Body Mass Index: 27.55 kg/m  Pulse: 80 (Regular)  BP: 112/68 (Sitting, Right Arm, Standard)  Physical Exam General Mental Status -Alert, cooperative and good historian. General Appearance-pleasant, Not in acute distress. Orientation-Oriented X3. Build & Nutrition-Well nourished and Well developed.  Head and Neck Head-normocephalic, atraumatic . Neck Global Assessment - supple, no bruit auscultated on the right, no bruit auscultated on the left.  Eye Vision-Wears corrective lenses. Pupil - Bilateral-Regular and Round. Motion - Bilateral-EOMI.  Chest and Lung Exam Auscultation Breath sounds - clear at anterior chest wall and clear at posterior chest wall. Adventitious sounds - No Adventitious sounds.  Cardiovascular Auscultation Rhythm - Regular rate and rhythm. Heart Sounds - S1 WNL and S2 WNL. Murmurs & Other Heart Sounds - Auscultation of the heart reveals - No Murmurs.  Abdomen Palpation/Percussion Tenderness - Abdomen is non-tender to palpation. Rigidity (guarding) - Abdomen is soft. Auscultation Auscultation of the abdomen reveals - Bowel sounds normal.  Male Genitourinary Note: Not done, not pertinent to present  illness   Musculoskeletal Note: On exam he is alert and oriented in no apparent distress. Left knee shows no swelling. Range remains about 0 to 125 degrees. He has very slight crepitus on range of motion. He does not have any tenderness anywhere or any instability noted. His right knee shows no effusion. He has slight varus, moderate crepitus on range of motion. Tender medial greater than lateral with no instability.  RADIOGRAPHS: AP both knees and lateral show that the prosthesis on the left remains in excellent position with no periprosthetic abnormalities. On the right he has bone on bone arthritis in the medial compartment with some patellofemoral involvement also.  Assessment & Plan Primary osteoarthritis of right knee (M17.11)  Note:Surgical Plans: Right Total Knee Replacement  Disposition: Home  PCP: Dr. Elease Hashimoto - Patient has been seen preoperatively and felt to be stable for surgery.  Topical TXA - Cancer  Anesthesia Issues: None, except for for a failed spinal with previous knee surgery and converted to a general with sedation lasting for several days.  Signed electronically by Ok Edwards, III PA-C

## 2016-04-02 ENCOUNTER — Inpatient Hospital Stay (HOSPITAL_COMMUNITY): Payer: Medicare Other | Admitting: Certified Registered Nurse Anesthetist

## 2016-04-02 ENCOUNTER — Inpatient Hospital Stay (HOSPITAL_COMMUNITY)
Admission: RE | Admit: 2016-04-02 | Discharge: 2016-04-04 | DRG: 470 | Disposition: A | Discharge status: Home / Self Care | Payer: Medicare Other | Source: Ambulatory Visit | Attending: Orthopedic Surgery | Admitting: Orthopedic Surgery

## 2016-04-02 ENCOUNTER — Encounter (HOSPITAL_COMMUNITY)
Admission: RE | Disposition: A | Discharge status: Home / Self Care | Payer: Self-pay | Source: Ambulatory Visit | Attending: Orthopedic Surgery

## 2016-04-02 ENCOUNTER — Encounter (HOSPITAL_COMMUNITY): Payer: Self-pay | Admitting: *Deleted

## 2016-04-02 DIAGNOSIS — Z8546 Personal history of malignant neoplasm of prostate: Secondary | ICD-10-CM

## 2016-04-02 DIAGNOSIS — Z96652 Presence of left artificial knee joint: Secondary | ICD-10-CM | POA: Diagnosis present

## 2016-04-02 DIAGNOSIS — Z7982 Long term (current) use of aspirin: Secondary | ICD-10-CM

## 2016-04-02 DIAGNOSIS — Z79899 Other long term (current) drug therapy: Secondary | ICD-10-CM

## 2016-04-02 DIAGNOSIS — M25561 Pain in right knee: Secondary | ICD-10-CM | POA: Diagnosis not present

## 2016-04-02 DIAGNOSIS — F419 Anxiety disorder, unspecified: Secondary | ICD-10-CM | POA: Diagnosis present

## 2016-04-02 DIAGNOSIS — I10 Essential (primary) hypertension: Secondary | ICD-10-CM | POA: Diagnosis present

## 2016-04-02 DIAGNOSIS — K219 Gastro-esophageal reflux disease without esophagitis: Secondary | ICD-10-CM | POA: Diagnosis present

## 2016-04-02 DIAGNOSIS — E785 Hyperlipidemia, unspecified: Secondary | ICD-10-CM | POA: Diagnosis present

## 2016-04-02 DIAGNOSIS — M1711 Unilateral primary osteoarthritis, right knee: Secondary | ICD-10-CM | POA: Diagnosis not present

## 2016-04-02 DIAGNOSIS — M179 Osteoarthritis of knee, unspecified: Secondary | ICD-10-CM | POA: Diagnosis present

## 2016-04-02 DIAGNOSIS — M1710 Unilateral primary osteoarthritis, unspecified knee: Secondary | ICD-10-CM | POA: Diagnosis present

## 2016-04-02 HISTORY — DX: Adverse effect of unspecified anesthetic, initial encounter: T41.45XA

## 2016-04-02 HISTORY — PX: TOTAL KNEE ARTHROPLASTY: SHX125

## 2016-04-02 HISTORY — DX: Diverticulitis of intestine, part unspecified, without perforation or abscess without bleeding: K57.92

## 2016-04-02 LAB — TYPE AND SCREEN
ABO/RH(D): O POS
ANTIBODY SCREEN: NEGATIVE

## 2016-04-02 SURGERY — ARTHROPLASTY, KNEE, TOTAL
Anesthesia: Spinal | Site: Knee | Laterality: Right

## 2016-04-02 MED ORDER — PHENYLEPHRINE HCL 10 MG/ML IJ SOLN
INTRAMUSCULAR | Status: AC
  Filled 2016-04-02: qty 1

## 2016-04-02 MED ORDER — BUPIVACAINE LIPOSOME 1.3 % IJ SUSP
20.0000 mL | Freq: Once | INTRAMUSCULAR | Status: DC
  Filled 2016-04-02: qty 20

## 2016-04-02 MED ORDER — ACETAMINOPHEN 325 MG PO TABS: 650.0000 mg | ORAL_TABLET | Freq: Four times a day (QID) | ORAL | Status: DC | PRN

## 2016-04-02 MED ORDER — OXYCODONE HCL 5 MG PO TABS
5.0000 mg | ORAL_TABLET | ORAL | Status: DC | PRN
  Administered 2016-04-02 (×2): 10 mg via ORAL
  Administered 2016-04-03 (×2): 5 mg via ORAL
  Administered 2016-04-03: 10 mg via ORAL
  Administered 2016-04-03 – 2016-04-04 (×3): 5 mg via ORAL
  Filled 2016-04-02 (×2): qty 2
  Filled 2016-04-02 (×3): qty 1
  Filled 2016-04-02: qty 2
  Filled 2016-04-02 (×2): qty 1

## 2016-04-02 MED ORDER — PHENOL 1.4 % MT LIQD
1.0000 | OROMUCOSAL | Status: DC | PRN
Start: 2016-04-02 — End: 2016-04-04

## 2016-04-02 MED ORDER — POLYETHYLENE GLYCOL 3350 17 G PO PACK: 17.0000 g | PACK | Freq: Every day | ORAL | Status: DC | PRN

## 2016-04-02 MED ORDER — OXYCODONE HCL 5 MG/5ML PO SOLN
5.0000 mg | Freq: Once | ORAL | Status: DC | PRN
  Filled 2016-04-02: qty 5

## 2016-04-02 MED ORDER — ONDANSETRON HCL 4 MG/2ML IJ SOLN
INTRAMUSCULAR | Status: DC | PRN
  Administered 2016-04-02: 4 mg via INTRAVENOUS

## 2016-04-02 MED ORDER — CEFAZOLIN SODIUM-DEXTROSE 2-4 GM/100ML-% IV SOLN
2.0000 g | Freq: Four times a day (QID) | INTRAVENOUS | Status: AC
  Administered 2016-04-02 – 2016-04-03 (×2): 2 g via INTRAVENOUS
  Filled 2016-04-02: qty 100

## 2016-04-02 MED ORDER — SODIUM CHLORIDE 0.9 % IJ SOLN
INTRAMUSCULAR | Status: DC | PRN
  Administered 2016-04-02: 30 mL

## 2016-04-02 MED ORDER — CEFAZOLIN SODIUM-DEXTROSE 2-4 GM/100ML-% IV SOLN
2.0000 g | INTRAVENOUS | Status: AC
  Administered 2016-04-02: 2 g via INTRAVENOUS
  Filled 2016-04-02: qty 100

## 2016-04-02 MED ORDER — STERILE WATER FOR IRRIGATION IR SOLN
Status: DC | PRN
  Administered 2016-04-02: 2000 mL

## 2016-04-02 MED ORDER — DIPHENHYDRAMINE HCL 12.5 MG/5ML PO ELIX: 12.5000 mg | ORAL_SOLUTION | ORAL | Status: DC | PRN

## 2016-04-02 MED ORDER — CHLORHEXIDINE GLUCONATE 4 % EX LIQD: 60.0000 mL | Freq: Once | CUTANEOUS | Status: DC

## 2016-04-02 MED ORDER — DEXAMETHASONE SODIUM PHOSPHATE 10 MG/ML IJ SOLN
INTRAMUSCULAR | Status: AC
  Filled 2016-04-02: qty 1

## 2016-04-02 MED ORDER — PHENYLEPHRINE HCL 10 MG/ML IJ SOLN
10.0000 mg | INTRAVENOUS | Status: DC | PRN
  Administered 2016-04-02: 35 ug/min via INTRAVENOUS

## 2016-04-02 MED ORDER — SODIUM CHLORIDE 0.9 % IR SOLN
Status: DC | PRN
  Administered 2016-04-02: 1000 mL

## 2016-04-02 MED ORDER — FENTANYL CITRATE (PF) 100 MCG/2ML IJ SOLN
INTRAMUSCULAR | Status: AC
  Filled 2016-04-02: qty 2

## 2016-04-02 MED ORDER — SODIUM CHLORIDE 0.9 % IJ SOLN
INTRAMUSCULAR | Status: AC
  Filled 2016-04-02: qty 50

## 2016-04-02 MED ORDER — ONDANSETRON HCL 4 MG/2ML IJ SOLN: 4.0000 mg | Freq: Four times a day (QID) | INTRAMUSCULAR | Status: DC | PRN

## 2016-04-02 MED ORDER — ATORVASTATIN CALCIUM 20 MG PO TABS
20.0000 mg | ORAL_TABLET | Freq: Every day | ORAL | Status: DC
  Administered 2016-04-02 – 2016-04-03 (×2): 20 mg via ORAL
  Filled 2016-04-02 (×2): qty 1

## 2016-04-02 MED ORDER — ACETAMINOPHEN 10 MG/ML IV SOLN
INTRAVENOUS | Status: AC
  Filled 2016-04-02: qty 100

## 2016-04-02 MED ORDER — PROPOFOL 10 MG/ML IV BOLUS
INTRAVENOUS | Status: AC
  Filled 2016-04-02: qty 20

## 2016-04-02 MED ORDER — HYDROMORPHONE HCL 1 MG/ML IJ SOLN: 0.2500 mg | INTRAMUSCULAR | Status: DC | PRN

## 2016-04-02 MED ORDER — BISACODYL 10 MG RE SUPP: 10.0000 mg | Freq: Every day | RECTAL | Status: DC | PRN

## 2016-04-02 MED ORDER — ONDANSETRON HCL 4 MG PO TABS: 4.0000 mg | ORAL_TABLET | Freq: Four times a day (QID) | ORAL | Status: DC | PRN

## 2016-04-02 MED ORDER — ACETAMINOPHEN 650 MG RE SUPP: 650.0000 mg | Freq: Four times a day (QID) | RECTAL | Status: DC | PRN

## 2016-04-02 MED ORDER — BUPIVACAINE LIPOSOME 1.3 % IJ SUSP
INTRAMUSCULAR | Status: DC | PRN
  Administered 2016-04-02: 20 mL

## 2016-04-02 MED ORDER — MORPHINE SULFATE (PF) 2 MG/ML IV SOLN: 1.0000 mg | INTRAVENOUS | Status: DC | PRN

## 2016-04-02 MED ORDER — SODIUM CHLORIDE 0.9 % IV SOLN
INTRAVENOUS | Status: DC
  Administered 2016-04-02 (×2): via INTRAVENOUS

## 2016-04-02 MED ORDER — ACETAMINOPHEN 160 MG/5ML PO SOLN: 325.0000 mg | ORAL | Status: DC | PRN

## 2016-04-02 MED ORDER — DEXAMETHASONE SODIUM PHOSPHATE 10 MG/ML IJ SOLN
10.0000 mg | Freq: Once | INTRAMUSCULAR | Status: AC
Start: 2016-04-02 — End: 2016-04-02
  Administered 2016-04-02: 10 mg via INTRAVENOUS

## 2016-04-02 MED ORDER — PROPOFOL 10 MG/ML IV BOLUS
INTRAVENOUS | Status: DC | PRN
  Administered 2016-04-02: 20 mg via INTRAVENOUS

## 2016-04-02 MED ORDER — MENTHOL 3 MG MT LOZG: 1.0000 | LOZENGE | OROMUCOSAL | Status: DC | PRN

## 2016-04-02 MED ORDER — DEXAMETHASONE SODIUM PHOSPHATE 10 MG/ML IJ SOLN
10.0000 mg | Freq: Once | INTRAMUSCULAR | Status: AC
  Administered 2016-04-03: 10 mg via INTRAVENOUS
  Filled 2016-04-02: qty 1

## 2016-04-02 MED ORDER — BUPIVACAINE IN DEXTROSE 0.75-8.25 % IT SOLN
INTRATHECAL | Status: DC | PRN
  Administered 2016-04-02: 2 mL via INTRATHECAL

## 2016-04-02 MED ORDER — PROPOFOL 500 MG/50ML IV EMUL
INTRAVENOUS | Status: DC | PRN
  Administered 2016-04-02: 100 ug/kg/min via INTRAVENOUS

## 2016-04-02 MED ORDER — RIVAROXABAN 10 MG PO TABS
10.0000 mg | ORAL_TABLET | Freq: Every day | ORAL | Status: DC
  Administered 2016-04-03 – 2016-04-04 (×2): 10 mg via ORAL
  Filled 2016-04-02 (×2): qty 1

## 2016-04-02 MED ORDER — ACETAMINOPHEN 10 MG/ML IV SOLN
1000.0000 mg | Freq: Once | INTRAVENOUS | Status: AC
  Administered 2016-04-02: 1000 mg via INTRAVENOUS
  Filled 2016-04-02: qty 100

## 2016-04-02 MED ORDER — ACETAMINOPHEN 325 MG PO TABS: 325.0000 mg | ORAL_TABLET | ORAL | Status: DC | PRN

## 2016-04-02 MED ORDER — ACETAMINOPHEN 500 MG PO TABS
1000.0000 mg | ORAL_TABLET | Freq: Four times a day (QID) | ORAL | Status: AC
  Administered 2016-04-02 – 2016-04-03 (×4): 1000 mg via ORAL
  Filled 2016-04-02 (×4): qty 2

## 2016-04-02 MED ORDER — METOCLOPRAMIDE HCL 5 MG/ML IJ SOLN
5.0000 mg | Freq: Three times a day (TID) | INTRAMUSCULAR | Status: DC | PRN
Start: 2016-04-02 — End: 2016-04-04

## 2016-04-02 MED ORDER — OXYCODONE HCL 5 MG PO TABS: 5.0000 mg | ORAL_TABLET | Freq: Once | ORAL | Status: DC | PRN

## 2016-04-02 MED ORDER — LACTATED RINGERS IV SOLN
INTRAVENOUS | Status: DC
  Administered 2016-04-02 (×3): via INTRAVENOUS

## 2016-04-02 MED ORDER — PROPOFOL 10 MG/ML IV BOLUS
INTRAVENOUS | Status: AC
  Filled 2016-04-02: qty 60

## 2016-04-02 MED ORDER — METHOCARBAMOL 1000 MG/10ML IJ SOLN
500.0000 mg | Freq: Four times a day (QID) | INTRAVENOUS | Status: DC | PRN
  Administered 2016-04-02: 500 mg via INTRAVENOUS
  Filled 2016-04-02: qty 550
  Filled 2016-04-02: qty 5

## 2016-04-02 MED ORDER — 0.9 % SODIUM CHLORIDE (POUR BTL) OPTIME
TOPICAL | Status: DC | PRN
  Administered 2016-04-02: 1000 mL

## 2016-04-02 MED ORDER — METHOCARBAMOL 500 MG PO TABS
500.0000 mg | ORAL_TABLET | Freq: Four times a day (QID) | ORAL | Status: DC | PRN
  Administered 2016-04-02 – 2016-04-04 (×5): 500 mg via ORAL
  Filled 2016-04-02 (×5): qty 1

## 2016-04-02 MED ORDER — TRANEXAMIC ACID 1000 MG/10ML IV SOLN
2000.0000 mg | INTRAVENOUS | Status: DC | PRN
  Administered 2016-04-02: 2000 mg via TOPICAL

## 2016-04-02 MED ORDER — BUPIVACAINE HCL (PF) 0.25 % IJ SOLN
INTRAMUSCULAR | Status: AC
  Filled 2016-04-02: qty 30

## 2016-04-02 MED ORDER — FLEET ENEMA 7-19 GM/118ML RE ENEM: 1.0000 | ENEMA | Freq: Once | RECTAL | Status: DC | PRN

## 2016-04-02 MED ORDER — IRBESARTAN 150 MG PO TABS
150.0000 mg | ORAL_TABLET | Freq: Every day | ORAL | Status: DC
  Administered 2016-04-03: 150 mg via ORAL
  Filled 2016-04-02: qty 1

## 2016-04-02 MED ORDER — SERTRALINE HCL 50 MG PO TABS
50.0000 mg | ORAL_TABLET | Freq: Every day | ORAL | Status: DC
  Administered 2016-04-03 – 2016-04-04 (×2): 50 mg via ORAL
  Filled 2016-04-02 (×2): qty 1

## 2016-04-02 MED ORDER — TRAMADOL HCL 50 MG PO TABS
50.0000 mg | ORAL_TABLET | Freq: Four times a day (QID) | ORAL | Status: DC | PRN
  Administered 2016-04-04: 50 mg via ORAL
  Filled 2016-04-02: qty 1

## 2016-04-02 MED ORDER — FENTANYL CITRATE (PF) 100 MCG/2ML IJ SOLN
INTRAMUSCULAR | Status: DC | PRN
  Administered 2016-04-02: 100 ug via INTRAVENOUS

## 2016-04-02 MED ORDER — BUPIVACAINE HCL 0.25 % IJ SOLN
INTRAMUSCULAR | Status: DC | PRN
  Administered 2016-04-02: 20 mL

## 2016-04-02 MED ORDER — CEFAZOLIN SODIUM-DEXTROSE 2-4 GM/100ML-% IV SOLN
INTRAVENOUS | Status: AC
  Filled 2016-04-02: qty 100

## 2016-04-02 MED ORDER — METOCLOPRAMIDE HCL 5 MG PO TABS: 5.0000 mg | ORAL_TABLET | Freq: Three times a day (TID) | ORAL | Status: DC | PRN

## 2016-04-02 MED ORDER — ONDANSETRON HCL 4 MG/2ML IJ SOLN
INTRAMUSCULAR | Status: AC
  Filled 2016-04-02: qty 2

## 2016-04-02 MED ORDER — MIDAZOLAM HCL 2 MG/2ML IJ SOLN
INTRAMUSCULAR | Status: AC
  Filled 2016-04-02: qty 2

## 2016-04-02 MED ORDER — DOCUSATE SODIUM 100 MG PO CAPS
100.0000 mg | ORAL_CAPSULE | Freq: Two times a day (BID) | ORAL | Status: DC
  Administered 2016-04-02 – 2016-04-04 (×4): 100 mg via ORAL
  Filled 2016-04-02 (×4): qty 1

## 2016-04-02 MED ORDER — ALPRAZOLAM 0.5 MG PO TABS
0.5000 mg | ORAL_TABLET | Freq: Three times a day (TID) | ORAL | Status: DC | PRN
  Administered 2016-04-02 – 2016-04-03 (×2): 0.5 mg via ORAL
  Filled 2016-04-02 (×2): qty 1

## 2016-04-02 MED ORDER — TRANEXAMIC ACID 1000 MG/10ML IV SOLN
2000.0000 mg | Freq: Once | INTRAVENOUS | Status: DC
  Filled 2016-04-02: qty 20

## 2016-04-02 SURGICAL SUPPLY — 54 items
BAG DECANTER FOR FLEXI CONT (MISCELLANEOUS) ×3 IMPLANT
BAG ZIPLOCK 12X15 (MISCELLANEOUS) ×3 IMPLANT
BANDAGE ACE 6X5 VEL STRL LF (GAUZE/BANDAGES/DRESSINGS) ×3 IMPLANT
BLADE SAG 18X100X1.27 (BLADE) ×3 IMPLANT
BLADE SAW SGTL 11.0X1.19X90.0M (BLADE) ×3 IMPLANT
BOWL SMART MIX CTS (DISPOSABLE) ×3 IMPLANT
CAPT KNEE TOTAL 3 ATTUNE ×3 IMPLANT
CEMENT HV SMART SET (Cement) ×6 IMPLANT
CLOSURE WOUND 1/2 X4 (GAUZE/BANDAGES/DRESSINGS) ×2
CLOTH BEACON ORANGE TIMEOUT ST (SAFETY) ×3 IMPLANT
CUFF TOURN SGL QUICK 34 (TOURNIQUET CUFF) ×2
CUFF TRNQT CYL 34X4X40X1 (TOURNIQUET CUFF) ×1 IMPLANT
DECANTER SPIKE VIAL GLASS SM (MISCELLANEOUS) ×3 IMPLANT
DRAPE U-SHAPE 47X51 STRL (DRAPES) ×3 IMPLANT
DRSG ADAPTIC 3X8 NADH LF (GAUZE/BANDAGES/DRESSINGS) ×3 IMPLANT
DRSG PAD ABDOMINAL 8X10 ST (GAUZE/BANDAGES/DRESSINGS) ×3 IMPLANT
DURAPREP 26ML APPLICATOR (WOUND CARE) ×3 IMPLANT
ELECT REM PT RETURN 9FT ADLT (ELECTROSURGICAL) ×3
ELECTRODE REM PT RTRN 9FT ADLT (ELECTROSURGICAL) ×1 IMPLANT
EVACUATOR 1/8 PVC DRAIN (DRAIN) ×3 IMPLANT
GAUZE SPONGE 4X4 12PLY STRL (GAUZE/BANDAGES/DRESSINGS) ×3 IMPLANT
GLOVE BIO SURGEON STRL SZ8 (GLOVE) ×3 IMPLANT
GLOVE BIOGEL PI IND STRL 6.5 (GLOVE) ×1 IMPLANT
GLOVE BIOGEL PI IND STRL 7.0 (GLOVE) ×1 IMPLANT
GLOVE BIOGEL PI IND STRL 7.5 (GLOVE) ×2 IMPLANT
GLOVE BIOGEL PI IND STRL 8 (GLOVE) ×1 IMPLANT
GLOVE BIOGEL PI INDICATOR 6.5 (GLOVE) ×2
GLOVE BIOGEL PI INDICATOR 7.0 (GLOVE) ×2
GLOVE BIOGEL PI INDICATOR 7.5 (GLOVE) ×4
GLOVE BIOGEL PI INDICATOR 8 (GLOVE) ×2
GLOVE SURG SS PI 6.5 STRL IVOR (GLOVE) ×6 IMPLANT
GLOVE SURG SS PI 7.0 STRL IVOR (GLOVE) ×3 IMPLANT
GLOVE SURG SS PI 7.5 STRL IVOR (GLOVE) ×12 IMPLANT
GOWN STRL REUS W/ TWL XL LVL3 (GOWN DISPOSABLE) ×1 IMPLANT
GOWN STRL REUS W/TWL LRG LVL3 (GOWN DISPOSABLE) ×6 IMPLANT
GOWN STRL REUS W/TWL XL LVL3 (GOWN DISPOSABLE) ×8 IMPLANT
HANDPIECE INTERPULSE COAX TIP (DISPOSABLE) ×2
IMMOBILIZER KNEE 20 (SOFTGOODS) ×3
IMMOBILIZER KNEE 20 THIGH 36 (SOFTGOODS) ×1 IMPLANT
MANIFOLD NEPTUNE II (INSTRUMENTS) ×3 IMPLANT
PACK TOTAL KNEE CUSTOM (KITS) ×3 IMPLANT
PAD ABD 8X10 STRL (GAUZE/BANDAGES/DRESSINGS) ×3 IMPLANT
PADDING CAST COTTON 6X4 STRL (CAST SUPPLIES) ×6 IMPLANT
POSITIONER SURGICAL ARM (MISCELLANEOUS) ×3 IMPLANT
SET HNDPC FAN SPRY TIP SCT (DISPOSABLE) ×1 IMPLANT
STRIP CLOSURE SKIN 1/2X4 (GAUZE/BANDAGES/DRESSINGS) ×4 IMPLANT
SUT MNCRL AB 4-0 PS2 18 (SUTURE) ×3 IMPLANT
SUT VIC AB 2-0 CT1 27 (SUTURE) ×6
SUT VIC AB 2-0 CT1 TAPERPNT 27 (SUTURE) ×3 IMPLANT
SUT VLOC 180 0 24IN GS25 (SUTURE) ×3 IMPLANT
SYR 50ML LL SCALE MARK (SYRINGE) ×3 IMPLANT
TRAY FOLEY W/METER SILVER 16FR (SET/KITS/TRAYS/PACK) ×3 IMPLANT
WRAP KNEE MAXI GEL POST OP (GAUZE/BANDAGES/DRESSINGS) ×3 IMPLANT
YANKAUER SUCT BULB TIP 10FT TU (MISCELLANEOUS) ×3 IMPLANT

## 2016-04-02 NOTE — Anesthesia Procedure Notes (Signed)
Spinal  Patient location during procedure: OR Staffing Anesthesiologist: Adreona Brand Preanesthetic Checklist Completed: patient identified, surgical consent, pre-op evaluation, timeout performed, IV checked, risks and benefits discussed and monitors and equipment checked Spinal Block Patient position: sitting Prep: site prepped and draped and DuraPrep Patient monitoring: heart rate, cardiac monitor, continuous pulse ox and blood pressure Approach: midline Location: L3-4 Injection technique: single-shot Needle Needle type: Pencan  Needle gauge: 24 G Needle length: 10 cm Assessment Sensory level: T4     

## 2016-04-02 NOTE — Anesthesia Postprocedure Evaluation (Signed)
Anesthesia Post Note  Patient: Micheal [name]  Procedure(s) Performed: Procedure(s) (LRB): RIGHT TOTAL KNEE ARTHROPLASTY (Right)  Patient location during evaluation: PACU Anesthesia Type: Spinal Level of consciousness: awake Pain management: pain level controlled Vital Signs Assessment: post-procedure vital signs reviewed and stable Respiratory status: spontaneous breathing Cardiovascular status: stable Postop Assessment: no signs of nausea or vomiting and spinal receding Anesthetic complications: no    Last Vitals:  Filed Vitals:   04/02/16 1630 04/02/16 1648  BP: 129/79 131/74  Pulse: 64 63  Temp: 36.7 C 36.7 C  Resp: 15 16    Last Pain:  Filed Vitals:   04/02/16 1650  PainSc: Asleep                 [name]

## 2016-04-02 NOTE — Transfer of Care (Signed)
Immediate Anesthesia Transfer of Care Note  Patient: Micheal [name]  Procedure(s) Performed: Procedure(s): RIGHT TOTAL KNEE ARTHROPLASTY (Right)  Patient Location: PACU  Anesthesia Type:Spinal  Level of Consciousness: sedated and patient cooperative  Airway & Oxygen Therapy: Patient Spontanous Breathing and Patient connected to face mask oxygen  Post-op Assessment: Report given to RN and Post -op Vital signs reviewed and stable  Post vital signs: stable  Last Vitals:  Filed Vitals:   04/02/16 1047  BP: 149/93  Pulse: 64  Temp: 36.9 C  Resp: 16    Last Pain: There were no vitals filed for this visit.       Complications: No apparent anesthesia complications

## 2016-04-02 NOTE — Anesthesia Preprocedure Evaluation (Signed)
Anesthesia Evaluation  Patient identified by MRN, date of birth, ID band Patient awake    Reviewed: Allergy & Precautions, NPO status , Patient's Chart, lab work & pertinent test results  History of Anesthesia Complications (+) history of anesthetic complications  Airway Mallampati: II  TM Distance: >3 FB Neck ROM: Full    Dental  (+) Teeth Intact   Pulmonary sleep apnea and Continuous Positive Airway Pressure Ventilation ,    breath sounds clear to auscultation       Cardiovascular hypertension, Pt. on medications (-) angina(-) Past MI and (-) CHF  Rhythm:Regular     Neuro/Psych PSYCHIATRIC DISORDERS Anxiety    GI/Hepatic Neg liver ROS, GERD  Controlled,  Endo/Other  negative endocrine ROS  Renal/GU negative Renal ROS     Musculoskeletal  (+) Arthritis ,   Abdominal   Peds  Hematology negative hematology ROS (+)   Anesthesia Other Findings   Reproductive/Obstetrics                             Anesthesia Physical Anesthesia Plan  ASA: II  Anesthesia Plan: Spinal   Post-op Pain Management:    Induction: Intravenous  Airway Management Planned: Natural Airway, Nasal Cannula and Simple Face Mask  Additional Equipment: None  Intra-op Plan:   Post-operative Plan:   Informed Consent: I have reviewed the patients History and Physical, chart, labs and discussed the procedure including the risks, benefits and alternatives for the proposed anesthesia with the patient or authorized representative who has indicated his/her understanding and acceptance.   Dental advisory given  Plan Discussed with: CRNA and Surgeon  Anesthesia Plan Comments:         Anesthesia Quick Evaluation

## 2016-04-02 NOTE — H&P (View-Only) (Signed)
Micheal [name] DOB: Nov 20, 1945 Married / Language: English / Race: White Male Date of Admission:  04/02/2016 CC:  Right Knen pain History of Present Illness The patient is a 70 year old male who comes in  for a preoperative History and Physical. The patient is scheduled for a right total knee arthroplasty to be performed by Dr. Dione Plover. Aluisio, MD at [facility] on 04/02/2016. The patient is a 70 year old male who presented for follow up of their right knee. The patient is being followed for their right knee OA and left TKA. Symptoms reported include: pain (only on the right). and report their pain level to be mild to moderate (if he has had a busy day ). The following medication has been used for pain control: Tylenol (prn). The left knee is oding okay but the right knee continues to be a problem. He is now reached a point where he would like to get the knee replaced. They have been treated conservatively in the past for the above stated problem and despite conservative measures, they continue to have progressive pain and severe functional limitations and dysfunction. They have failed non-operative management including home exercise, medications. It is felt that they would benefit from undergoing total joint replacement. Risks and benefits of the procedure have been discussed with the patient and they elect to proceed with surgery. There are no active contraindications to surgery such as ongoing infection or rapidly progressive neurological disease. Please note that the patient had a recent ED visit and was diagnosed with diverticulitis.  He has been seen by his gastroenterologist, Dr. Cristina Gong, and felt that he would be fine to proceed with surgery as planned.   Problem List/Past Medical Chronic pain of right knee (M25.561)  Status post total left knee replacement [id number])  Aftercare following left knee joint replacement surgery (Z47.1)  Primary osteoarthritis of right knee (M17.11)   Anxiety Disorder  High blood pressure  Kidney Stone  Prostate Cancer  Prostate Disease  Sleep Apnea  Hyperlipidemia  Diverticulitis Of Colon  July 2017  Allergies No Known Drug Allergies   Family History Cancer  grandmother mothers side Congestive Heart Failure  father and grandfather fathers side  Social History Alcohol use  current drinker; drinks wine; only occasionally per week Children  2 Current work status  working full time Drug/Alcohol Rehab (Currently)  no Drug/Alcohol Rehab (Previously)  no Exercise  Exercises rarely Illicit drug use  no Living situation  live with spouse Marital status  married Number of flights of stairs before winded  2-3 Pain Contract  no Tobacco / smoke exposure  no Tobacco use  never smoker  Medication History  Tylenol (500MG  Capsule, Oral as needed) Active. Lipitor (10MG  Tablet, Oral daily) Active. Benicar (5MG  Tablet, Oral) Active. Aspirin EC (81MG  Tablet DR, Oral) Active. Sertraline HCl (Oral) Specific strength unknown - Active. Probiotic Active. Calcium Supplement Active. Multivitamin Active.  Past Surgical History Arthroscopy of Knee  bilateral Prostatectomy; Abdominal  Spinal Surgery  Straighten Nasal Septum  Tonsillectomy  Total Knee Replacement  left L4 Discectomy  Date: 2003.  Review of Systems General Not Present- Chills, Fatigue, Fever, Memory Loss, Night Sweats, Weight Gain and Weight Loss. Skin Not Present- Eczema, Hives, Itching, Lesions and Rash. HEENT Not Present- Dentures, Double Vision, Headache, Hearing Loss, Tinnitus and Visual Loss. Respiratory Not Present- Allergies, Chronic Cough, Coughing up blood, Shortness of breath at rest and Shortness of breath with exertion. Cardiovascular Not Present- Chest Pain, Difficulty Breathing Lying Down,  Murmur, Palpitations, Racing/skipping heartbeats and Swelling. Gastrointestinal Not Present- Abdominal Pain, Bloody Stool,  Constipation, Diarrhea, Difficulty Swallowing, Heartburn, Jaundice, Loss of appetitie, Nausea and Vomiting. Male Genitourinary Not Present- Blood in Urine, Discharge, Flank Pain, Incontinence, Painful Urination, Urgency, Urinary frequency, Urinary Retention, Urinating at Night and Weak urinary stream. Musculoskeletal Present- Joint Pain. Not Present- Back Pain, Joint Swelling, Morning Stiffness, Muscle Pain, Muscle Weakness and Spasms. Neurological Not Present- Blackout spells, Difficulty with balance, Dizziness, Paralysis, Tremor and Weakness. Psychiatric Not Present- Insomnia.  Vitals  Weight: 192 lb Height: 70in Weight was reported by patient. Height was reported by patient. Body Surface Area: 2.05 m Body Mass Index: 27.55 kg/m  Pulse: 80 (Regular)  BP: 112/68 (Sitting, Right Arm, Standard)  Physical Exam General Mental Status -Alert, cooperative and good historian. General Appearance-pleasant, Not in acute distress. Orientation-Oriented X3. Build & Nutrition-Well nourished and Well developed.  Head and Neck Head-normocephalic, atraumatic . Neck Global Assessment - supple, no bruit auscultated on the right, no bruit auscultated on the left.  Eye Vision-Wears corrective lenses. Pupil - Bilateral-Regular and Round. Motion - Bilateral-EOMI.  Chest and Lung Exam Auscultation Breath sounds - clear at anterior chest wall and clear at posterior chest wall. Adventitious sounds - No Adventitious sounds.  Cardiovascular Auscultation Rhythm - Regular rate and rhythm. Heart Sounds - S1 WNL and S2 WNL. Murmurs & Other Heart Sounds - Auscultation of the heart reveals - No Murmurs.  Abdomen Palpation/Percussion Tenderness - Abdomen is non-tender to palpation. Rigidity (guarding) - Abdomen is soft. Auscultation Auscultation of the abdomen reveals - Bowel sounds normal.  Male Genitourinary Note: Not done, not pertinent to present  illness   Musculoskeletal Note: On exam he is alert and oriented in no apparent distress. Left knee shows no swelling. Range remains about 0 to 125 degrees. He has very slight crepitus on range of motion. He does not have any tenderness anywhere or any instability noted. His right knee shows no effusion. He has slight varus, moderate crepitus on range of motion. Tender medial greater than lateral with no instability.  RADIOGRAPHS: AP both knees and lateral show that the prosthesis on the left remains in excellent position with no periprosthetic abnormalities. On the right he has bone on bone arthritis in the medial compartment with some patellofemoral involvement also.  Assessment & Plan Primary osteoarthritis of right knee (M17.11)  Note:Surgical Plans: Right Total Knee Replacement  Disposition: Home  PCP: Dr. Elease Hashimoto - Patient has been seen preoperatively and felt to be stable for surgery.  Topical TXA - Cancer  Anesthesia Issues: None, except for for a failed spinal with previous knee surgery and converted to a general with sedation lasting for several days.  Signed electronically by Ok Edwards, III PA-C

## 2016-04-02 NOTE — Interval H&P Note (Signed)
History and Physical Interval Note:  04/02/2016 12:34 PM  Micheal [name]  has presented today for surgery, with the diagnosis of RIGHT KNEE OA  The various methods of treatment have been discussed with the patient and family. After consideration of risks, benefits and other options for treatment, the patient has consented to  Procedure(s): RIGHT TOTAL KNEE ARTHROPLASTY (Right) as a surgical intervention .  The patient's history has been reviewed, patient examined, no change in status, stable for surgery.  I have reviewed the patient's chart and labs.  Questions were answered to the patient's satisfaction.     [name]

## 2016-04-02 NOTE — Op Note (Signed)
Pre-operative diagnosis- Osteoarthritis  Right knee(s)  Post-operative diagnosis- Osteoarthritis Right knee(s)  Procedure-  Right  Total Knee Arthroplasty  Surgeon- Dione Plover. Steele Ledonne, MD  Assistant- Ardeen Jourdain, PA-C   Anesthesia-  Spinal  EBL-* No blood loss amount entered *   Drains Hemovac  Tourniquet time- 34 minutes @ [id number] mm Hg  Complications- None  Condition-PACU - hemodynamically stable.   Brief Clinical Note   Micheal [name] is a 70 y.o. year old male with end stage OA of his right knee with progressively worsening pain and dysfunction. He has constant pain, with activity and at rest and significant functional deficits with difficulties even with ADLs. He has had extensive non-op management including analgesics, injections of cortisone and viscosupplements, and home exercise program, but remains in significant pain with significant dysfunction. Radiographs show bone on bone arthritis medial and patellofemoral. He presents now for right Total Knee Arthroplasty.   Procedure in detail---   The patient is brought into the operating room and positioned supine on the operating table. After successful administration of  Spinal,   a tourniquet is placed high on the  Right thigh(s) and the lower extremity is prepped and draped in the usual sterile fashion. Time out is performed by the operating team and then the  Right lower extremity is wrapped in Esmarch, knee flexed and the tourniquet inflated to 300 mmHg.       A midline incision is made with a ten blade through the subcutaneous tissue to the level of the extensor mechanism. A fresh blade is used to make a medial parapatellar arthrotomy. Soft tissue over the proximal medial tibia is subperiosteally elevated to the joint line with a knife and into the semimembranosus bursa with a Cobb elevator. Soft tissue over the proximal lateral tibia is elevated with attention being paid to avoiding the patellar tendon on the tibial tubercle.  The patella is everted, knee flexed 90 degrees and the ACL and PCL are removed. Findings are bone on bone medial and patellofemoral with large global osteophytes.        The drill is used to create a starting hole in the distal femur and the canal is thoroughly irrigated with sterile saline to remove the fatty contents. The 5 degree Right  valgus alignment guide is placed into the femoral canal and the distal femoral cutting block is pinned to remove 9 mm off the distal femur. Resection is made with an oscillating saw.      The tibia is subluxed forward and the menisci are removed. The extramedullary alignment guide is placed referencing proximally at the medial aspect of the tibial tubercle and distally along the second metatarsal axis and tibial crest. The block is pinned to remove 35mm off the more deficient medial  side. Resection is made with an oscillating saw. Size 8is the most appropriate size for the tibia and the proximal tibia is prepared with the modular drill and keel punch for that size.      The femoral sizing guide is placed and size 7 is most appropriate. Rotation is marked off the epicondylar axis and confirmed by creating a rectangular flexion gap at 90 degrees. The size 7 cutting block is pinned in this rotation and the anterior, posterior and chamfer cuts are made with the oscillating saw. The intercondylar block is then placed and that cut is made.      Trial size 8 tibial component, trial size 7 posterior stabilized femur and a 8  mm posterior stabilized  rotating platform insert trial is placed. Full extension is achieved with excellent varus/valgus and anterior/posterior balance throughout full range of motion. The patella is everted and thickness measured to be 27  mm. Free hand resection is taken to 15 mm, a 41 template is placed, lug holes are drilled, trial patella is placed, and it tracks normally. Osteophytes are removed off the posterior femur with the trial in place. All trials are  removed and the cut bone surfaces prepared with pulsatile lavage. Cement is mixed and once ready for implantation, the size 8 tibial implant, size  7 posterior stabilized femoral component, and the size 41 patella are cemented in place and the patella is held with the clamp. The trial insert is placed and the knee held in full extension. The Exparel (20 ml mixed with 30 ml saline) and .25% Bupivicaine, are injected into the extensor mechanism, posterior capsule, medial and lateral gutters and subcutaneous tissues.  All extruded cement is removed and once the cement is hard the permanent 8 mm posterior stabilized rotating platform insert is placed into the tibial tray.      The wound is copiously irrigated with saline solution and the extensor mechanism closed over a hemovac drain with #1 V-loc suture. The tourniquet is released for a total tourniquet time of 34  minutes. Flexion against gravity is 140 degrees and the patella tracks normally. Subcutaneous tissue is closed with 2.0 vicryl and subcuticular with running 4.0 Monocryl. The incision is cleaned and dried and steri-strips and a bulky sterile dressing are applied. The limb is placed into a knee immobilizer and the patient is awakened and transported to recovery in stable condition.      Please note that a surgical assistant was a medical necessity for this procedure in order to perform it in a safe and expeditious manner. Surgical assistant was necessary to retract the ligaments and vital neurovascular structures to prevent injury to them and also necessary for proper positioning of the limb to allow for anatomic placement of the prosthesis.   Dione Plover Dorothyann Mourer, MD    04/02/2016, 2:21 PM

## 2016-04-03 LAB — BASIC METABOLIC PANEL
Anion gap: 6 (ref 5–15)
BUN: 15 mg/dL (ref 6–20)
CO2: 25 mmol/L (ref 22–32)
CREATININE: 0.78 mg/dL (ref 0.61–1.24)
Calcium: 8.4 mg/dL — ABNORMAL LOW (ref 8.9–10.3)
Chloride: 105 mmol/L (ref 101–111)
Glucose, Bld: 138 mg/dL — ABNORMAL HIGH (ref 65–99)
POTASSIUM: 4.4 mmol/L (ref 3.5–5.1)
SODIUM: 136 mmol/L (ref 135–145)

## 2016-04-03 LAB — CBC
HCT: 36.5 % — ABNORMAL LOW (ref 39.0–52.0)
Hemoglobin: 12.8 g/dL — ABNORMAL LOW (ref 13.0–17.0)
MCH: 32.2 pg (ref 26.0–34.0)
MCHC: 35.1 g/dL (ref 30.0–36.0)
MCV: 91.9 fL (ref 78.0–100.0)
PLATELETS: 301 10*3/uL (ref 150–400)
RBC: 3.97 MIL/uL — AB (ref 4.22–5.81)
RDW: 12.7 % (ref 11.5–15.5)
WBC: 22.5 10*3/uL — ABNORMAL HIGH (ref 4.0–10.5)

## 2016-04-03 MED ORDER — POLYETHYLENE GLYCOL 3350 17 G PO PACK
17.0000 g | PACK | Freq: Two times a day (BID) | ORAL | Status: DC
  Administered 2016-04-03 – 2016-04-04 (×3): 17 g via ORAL
  Filled 2016-04-03 (×2): qty 1

## 2016-04-03 MED ORDER — OXYCODONE HCL 5 MG PO TABS: 5.0000 mg | ORAL_TABLET | ORAL | Status: DC | PRN

## 2016-04-03 MED ORDER — POLYETHYLENE GLYCOL 3350 17 G PO PACK: 17.0000 g | PACK | Freq: Two times a day (BID) | ORAL | Status: DC

## 2016-04-03 MED ORDER — TRAMADOL HCL 50 MG PO TABS: 50.0000 mg | ORAL_TABLET | Freq: Four times a day (QID) | ORAL | Status: DC | PRN

## 2016-04-03 MED ORDER — ONDANSETRON HCL 4 MG PO TABS: 4.0000 mg | ORAL_TABLET | Freq: Four times a day (QID) | ORAL | Status: DC | PRN

## 2016-04-03 MED ORDER — RIVAROXABAN 10 MG PO TABS: 10.0000 mg | ORAL_TABLET | Freq: Every day | ORAL | Status: DC

## 2016-04-03 MED ORDER — METHOCARBAMOL 500 MG PO TABS: 500.0000 mg | ORAL_TABLET | Freq: Four times a day (QID) | ORAL | Status: DC | PRN

## 2016-04-03 NOTE — Evaluation (Addendum)
Occupational Therapy Evaluation Patient Details Name: Micheal [name] MRN: [id number] DOB: Mar 18, 1946 Today's Date: 04/03/2016    History of Present Illness s/p R TKA, h/o L 8 years ago   Clinical Impression   Pt is a 70 year old man who was admitted for the above surgery. All education was completed.  No further OT is needed at this time   Follow Up Recommendations  No OT follow up;Supervision/Assistance - 24 hour    Equipment Recommendations  3 in 1 bedside comode    Recommendations for Other Services       Precautions / Restrictions Precautions Precautions: Knee;Fall R KI Restrictions Weight Bearing Restrictions: No      Mobility Bed Mobility Overal bed mobility: Needs Assistance Bed Mobility: Supine to Sit     Supine to sit: Min assist     General bed mobility comments: light min A for RLE  Transfers Overall transfer level: Needs assistance Equipment used: Rolling walker (2 wheeled) Transfers: Sit to/from Stand Sit to Stand: Min guard         General transfer comment: for safety; cues for UE/LE placement    Balance                                            ADL Overall ADL's : Needs assistance/impaired             Lower Body Bathing: Minimal assistance;Sit to/from stand       Lower Body Dressing: Moderate assistance;Sit to/from stand   Toilet Transfer: Minimal assistance;Comfort height toilet;Ambulation;BSC             General ADL Comments: reviewed precautions and KI.  Wife will assist at home.  Donned LB clothing.  Pt would benefit from 3:1--he had borrowed one before and no longer has access to it.  Educated on tub readiness and options with 3:1 commode.  Pt can perform UB adls with set up     Vision     Perception     Praxis      Pertinent Vitals/Pain Pain Assessment: 0-10 Pain Score: 3  Pain Location: R knee Pain Descriptors / Indicators: Aching Pain Intervention(s): Limited activity within  patient's tolerance;Monitored during session;Premedicated before session;Repositioned;Ice applied     Hand Dominance     Extremity/Trunk Assessment Upper Extremity Assessment Upper Extremity Assessment: Overall WFL for tasks assessed           Communication Communication Communication: No difficulties   Cognition Arousal/Alertness: Awake/alert Behavior During Therapy: WFL for tasks assessed/performed Overall Cognitive Status: Within Functional Limits for tasks assessed                     General Comments       Exercises       Shoulder Instructions      Home Living Family/patient expects to be discharged to:: Private residence Living Arrangements: Spouse/significant other Available Help at Discharge: Family               Bathroom Shower/Tub: Tub/shower unit   Bathroom Toilet: Handicapped height     Home Equipment: Environmental consultant - 2 wheels (borrowed)          Prior Functioning/Environment Level of Independence: Independent             OT Diagnosis: Acute pain   OT Problem List:     OT Treatment/Interventions:  OT Goals(Current goals can be found in the care plan section) Acute Rehab OT Goals Patient Stated Goal: get back to being independent OT Goal Formulation: All assessment and education complete, DC therapy  OT Frequency:     Barriers to D/C:            Co-evaluation              End of Session    Activity Tolerance: Patient tolerated treatment well Patient left: in chair;with call bell/phone within reach;with chair alarm set   Time: [phone] OT Time Calculation (min): 29 min Charges:  OT General Charges $OT Visit: 1 Procedure OT Evaluation $OT Eval Low Complexity: 1 Procedure OT Treatments $Self Care/Home Management : 8-22 mins G-Codes:    Madalaine Portier 2016/05/02, 10:12 AM Lesle Chris, OTR/L [phone] 05-02-16

## 2016-04-03 NOTE — Care Management Note (Signed)
Case Management Note  Patient Details  Name: Micheal [name] MRN: [id number] Date of Birth: 10/10/1945  Subjective/Objective:                  RIGHT TOTAL KNEE ARTHROPLASTY (Right) Action/Plan: Discharge planning Expected Discharge Date:  04/04/16               Expected Discharge Plan:  Home/Self Care  In-House Referral:     Discharge planning Services  CM Consult  Post Acute Care Choice:    Choice offered to:  Patient  DME Arranged:  3-N-1 DME Agency:  [place]:  NA [place] Agency:  NA  Status of Service:  Completed, signed off  If discussed at [place] of Stay Meetings, dates discussed:    Additional Comments: CM met with pt in room to confirm plan for outpt PT; pt confirms.  CM called [facility] DME rep, Germaine to please deliver the 3n1 to room prior to discharge.  No other CM needs were communicated. Dellie Catholic, RN 04/03/2016, 11:53 AM

## 2016-04-03 NOTE — Progress Notes (Addendum)
Subjective: 1 Day Post-Op Procedure(s) (LRB): RIGHT TOTAL KNEE ARTHROPLASTY (Right) Patient reports pain as mild.   Patient seen in rounds with Dr. Wynelle Link.  Doing okay this morning. No complaints of belly pain.  Recent bout of diverticulitis.  Seen GI physician and recommended Miralax BID. Patient is well, but has had some minor complaints of pain in the knee, requiring pain medications We will start therapy today.  Plan is to go Home after hospital stay.  Plan to go straight to outpatient therapy end of the week.  Objective: Vital signs in last 24 hours: Temp:  [97.6 F (36.4 C)-98.9 F (37.2 C)] 97.7 F (36.5 C) (07/18 0525) Pulse Rate:  [57-76] 62 (07/18 0525) Resp:  [11-16] 16 (07/18 0525) BP: (109-149)/(65-93) 125/77 mmHg (07/18 0525) SpO2:  [97 %-100 %] 97 % (07/18 0525) Weight:  [89.075 kg (196 lb 6 oz)] 89.075 kg (196 lb 6 oz) (07/17 1100)  Intake/Output from previous day:  Intake/Output Summary (Last 24 hours) at 04/03/16 0801 Last data filed at 04/03/16 0556  Gross per 24 hour  Intake   4825 ml  Output   2715 ml  Net   2110 ml    Intake/Output this shift: UOP 575  Labs:  Recent Labs  04/03/16 0414  HGB 12.8*    Recent Labs  04/03/16 0414  WBC 22.5*  RBC 3.97*  HCT 36.5*  PLT 301    Recent Labs  04/03/16 0414  NA 136  K 4.4  CL 105  CO2 25  BUN 15  CREATININE 0.78  GLUCOSE 138*  CALCIUM 8.4*   No results for input(s): LABPT, INR in the last 72 hours.  EXAM General - Patient is Alert, Appropriate and Oriented Extremity - Neurovascular intact Sensation intact distally Dorsiflexion/Plantar flexion intact Dressing - dressing C/D/I Motor Function - intact, moving foot and toes well on exam.  Hemovac pulled without difficulty.  Past Medical History  Diagnosis Date  . Hyperlipidemia   . Hypertension   . Anxiety   . Benign essential tremor     hands  . History of colon polyps   . Sigmoid diverticulosis   . Urge urinary  incontinence   . History of kidney stones   . History of prostate cancer urologist-  dr Alinda Money-  currently PSA nondetectable 10/ 2016    s/p  radial prostatectomy w/ nerve sparing 07-17-2007/   stage T1c,  Gleason 3+3=6,  PSA 6.33  . Osteoarthritis     knees  . H/O balanitis     recurrent intermittant-- uses diprolene cream prn  . Synovial hypertrophy of left knee   . GERD (gastroesophageal reflux disease)   . Nocturia   . Mild obstructive sleep apnea     uses mouth guard only  . Complication of anesthesia 1999    spinal with last knee replacement  . Diverticulitis     last Saturday 03/24/16 -tx with antibiotics    Assessment/Plan: 1 Day Post-Op Procedure(s) (LRB): RIGHT TOTAL KNEE ARTHROPLASTY (Right) Principal Problem:   OA (osteoarthritis) of knee  Estimated body mass index is 28.18 kg/(m^2) as calculated from the following:   Height as of this encounter: 5\' 10"  (1.778 m).   Weight as of this encounter: 89.075 kg (196 lb 6 oz). Advance diet Up with therapy Plan for discharge tomorrow Discharge home. NO HHPT, straight to outpatient therapy  DVT Prophylaxis - Xarelto 10 mg for three weeks and then resume 81 mg Aspirin at home. Weight-Bearing as tolerated to right leg  D/C O2 and Pulse OX and try on Room Air  [name], PA-C Orthopaedic Surgery 04/03/2016, 8:01 AM

## 2016-04-03 NOTE — Discharge Instructions (Addendum)
° °Dr. Frank Aluisio °Total Joint Specialist °[facility] °[address] °[place], [place] [address] °[phone] ° °TOTAL KNEE REPLACEMENT POSTOPERATIVE DIRECTIONS ° °Knee Rehabilitation, Guidelines Following Surgery  °Results after knee surgery are often greatly improved when you follow the exercise, range of motion and muscle strengthening exercises prescribed by your doctor. Safety measures are also important to protect the knee from further injury. Any time any of these exercises cause you to have increased pain or swelling in your knee joint, decrease the amount until you are comfortable again and slowly increase them. If you have problems or questions, call your caregiver or physical therapist for advice.  ° °HOME CARE INSTRUCTIONS  °Remove items at home which could result in a fall. This includes throw rugs or furniture in walking pathways.  °· ICE to the affected knee every three hours for 30 minutes at a time and then as needed for pain and swelling.  Continue to use ice on the knee for pain and swelling from surgery. You may notice swelling that will progress down to the foot and ankle.  This is normal after surgery.  Elevate the leg when you are not up walking on it.   °· Continue to use the breathing machine which will help keep your temperature down.  It is common for your temperature to cycle up and down following surgery, especially at night when you are not up moving around and exerting yourself.  The breathing machine keeps your lungs expanded and your temperature down. °· Do not place pillow under knee, focus on keeping the knee straight while resting ° °DIET °You may resume your previous home diet once your are discharged from the hospital. ° °DRESSING / WOUND CARE / SHOWERING °You may shower 3 days after surgery, but keep the wounds dry during showering.  You may use an occlusive plastic wrap (Press'n Seal for example), NO SOAKING/SUBMERGING IN THE BATHTUB.  If the  bandage gets wet, change with a clean dry gauze.  If the incision gets wet, pat the wound dry with a clean towel. °You may start showering once you are discharged home but do not submerge the incision under water. Just pat the incision dry and apply a dry gauze dressing on daily. °Change the surgical dressing daily and reapply a dry dressing each time. ° °ACTIVITY °Walk with your walker as instructed. °Use walker as long as suggested by your caregivers. °Avoid periods of inactivity such as sitting longer than an hour when not asleep. This helps prevent blood clots.  °You may resume a sexual relationship in one month or when given the OK by your doctor.  °You may return to work once you are cleared by your doctor.  °Do not drive a car for 6 weeks or until released by you surgeon.  °Do not drive while taking narcotics. ° °WEIGHT BEARING °Weight bearing as tolerated with assist device (walker, cane, etc) as directed, use it as long as suggested by your surgeon or therapist, typically at least 4-6 weeks. ° °POSTOPERATIVE CONSTIPATION PROTOCOL °Constipation - defined medically as fewer than three stools per week and severe constipation as less than one stool per week. ° °One of the most common issues patients have following surgery is constipation.  Even if you have a regular bowel pattern at home, your normal regimen is likely to be disrupted due to multiple reasons following surgery.  Combination of anesthesia, postoperative narcotics, change in appetite and fluid intake all can affect your bowels.    In order to avoid complications following surgery, here are some recommendations in order to help you during your recovery period. ° °Colace (docusate) - Pick up an over-the-counter form of Colace or another stool softener and take twice a day as long as you are requiring postoperative pain medications.  Take with a full glass of water daily.  If you experience loose stools or diarrhea, hold the colace until you stool forms  back up.  If your symptoms do not get better within 1 week or if they get worse, check with your doctor. ° °Dulcolax (bisacodyl) - Pick up over-the-counter and take as directed by the product packaging as needed to assist with the movement of your bowels.  Take with a full glass of water.  Use this product as needed if not relieved by Colace only.  ° °MiraLax (polyethylene glycol) - Pick up over-the-counter to have on hand.  MiraLax is a solution that will increase the amount of water in your bowels to assist with bowel movements.  Take as directed and can mix with a glass of water, juice, soda, coffee, or tea.  Take if you go more than two days without a movement. °Do not use MiraLax more than once per day. Call your doctor if you are still constipated or irregular after using this medication for 7 days in a row. ° °If you continue to have problems with postoperative constipation, please contact the office for further assistance and recommendations.  If you experience "the worst abdominal pain ever" or develop nausea or vomiting, please contact the office immediatly for further recommendations for treatment. ° °ITCHING ° If you experience itching with your medications, try taking only a single pain pill, or even half a pain pill at a time.  You can also use Benadryl over the counter for itching or also to help with sleep.  ° °TED HOSE STOCKINGS °Wear the elastic stockings on both legs for three weeks following surgery during the day but you may remove then at night for sleeping. ° °MEDICATIONS °See your medication summary on the “After Visit Summary” that the nursing staff will review with you prior to discharge.  You may have some home medications which will be placed on hold until you complete the course of blood thinner medication.  It is important for you to complete the blood thinner medication as prescribed by your surgeon.  Continue your approved medications as instructed at time of  discharge. ° °PRECAUTIONS °If you experience chest pain or shortness of breath - call 911 immediately for transfer to the hospital emergency department.  °If you develop a fever greater that 101 F, purulent drainage from wound, increased redness or drainage from wound, foul odor from the wound/dressing, or calf pain - CONTACT YOUR SURGEON.   °                                                °FOLLOW-UP APPOINTMENTS °Make sure you keep all of your appointments after your operation with your surgeon and caregivers. You should call the office at the above phone number and make an appointment for approximately two weeks after the date of your surgery or on the date instructed by your surgeon outlined in the "After Visit Summary". ° ° °RANGE OF MOTION AND STRENGTHENING EXERCISES  °Rehabilitation of the knee is important following a knee injury or   an operation. After just a few days of immobilization, the muscles of the thigh which control the knee become weakened and shrink (atrophy). Knee exercises are designed to build up the tone and strength of the thigh muscles and to improve knee motion. Often times heat used for twenty to thirty minutes before working out will loosen up your tissues and help with improving the range of motion but do not use heat for the first two weeks following surgery. These exercises can be done on a training (exercise) mat, on the floor, on a table or on a bed. Use what ever works the best and is most comfortable for you Knee exercises include:  °Leg Lifts - While your knee is still immobilized in a splint or cast, you can do straight leg raises. Lift the leg to 60 degrees, hold for 3 sec, and slowly lower the leg. Repeat 10-20 times 2-3 times daily. Perform this exercise against resistance later as your knee gets better.  °Quad and Hamstring Sets - Tighten up the muscle on the front of the thigh (Quad) and hold for 5-10 sec. Repeat this 10-20 times hourly. Hamstring sets are done by pushing the  foot backward against an object and holding for 5-10 sec. Repeat as with quad sets.  °· Leg Slides: Lying on your back, slowly slide your foot toward your buttocks, bending your knee up off the floor (only go as far as is comfortable). Then slowly slide your foot back down until your leg is flat on the floor again. °· Angel Wings: Lying on your back spread your legs to the side as far apart as you can without causing discomfort.  °A rehabilitation program following serious knee injuries can speed recovery and prevent re-injury in the future due to weakened muscles. Contact your doctor or a physical therapist for more information on knee rehabilitation.  ° °IF YOU ARE TRANSFERRED TO A SKILLED REHAB FACILITY °If the patient is transferred to a skilled rehab facility following release from the hospital, a list of the current medications will be sent to the facility for the patient to continue.  When discharged from the skilled rehab facility, please have the facility set up the patient's Home Health Physical Therapy prior to being released. Also, the skilled facility will be responsible for providing the patient with their medications at time of release from the facility to include their pain medication, the muscle relaxants, and their blood thinner medication. If the patient is still at the rehab facility at time of the two week follow up appointment, the skilled rehab facility will also need to assist the patient in arranging follow up appointment in our office and any transportation needs. ° °MAKE SURE YOU:  °Understand these instructions.  °Get help right away if you are not doing well or get worse.  ° ° °Pick up stool softner and laxative for home use following surgery while on pain medications. °Do not submerge incision under water. °Please use good hand washing techniques while changing dressing each day. °May shower starting three days after surgery. °Please use a clean towel to pat the incision dry following  showers. °Continue to use ice for pain and swelling after surgery. °Do not use any lotions or creams on the incision until instructed by your surgeon. ° °Take Xarelto for two and a half more weeks, then discontinue Xarelto. °Once the patient has completed the Xarelto, they may resume the 81 mg Aspirin. ° ° °Information on my medicine - XARELTO® (Rivaroxaban) ° °  This medication education was reviewed with me or my healthcare representative as part of my discharge preparation.  The pharmacist that spoke with me during my hospital stay was:  Joycelyn Schmid Hitchins, Stu-PharmD  Why was Xarelto prescribed for you? Xarelto was prescribed for you to reduce the risk of blood clots forming after orthopedic surgery. The medical term for these abnormal blood clots is venous thromboembolism (VTE).  What do you need to know about xarelto ? Take your Xarelto ONCE DAILY at the same time every day. You may take it either with or without food.  If you have difficulty swallowing the tablet whole, you may crush it and mix in applesauce just prior to taking your dose.  Take Xarelto exactly as prescribed by your doctor and DO NOT stop taking Xarelto without talking to the doctor who prescribed the medication.  Stopping without other VTE prevention medication to take the place of Xarelto may increase your risk of developing a clot.  After discharge, you should have regular check-up appointments with your healthcare provider that is prescribing your Xarelto.    What do you do if you miss a dose? If you miss a dose, take it as soon as you remember on the same day then continue your regularly scheduled once daily regimen the next day. Do not take two doses of Xarelto on the same day.   Important Safety Information A possible side effect of Xarelto is bleeding. You should call your healthcare provider right away if you experience any of the following: ? Bleeding from an injury or your nose that does not  stop. ? Unusual colored urine (red or dark brown) or unusual colored stools (red or black). ? Unusual bruising for unknown reasons. ? A serious fall or if you hit your head (even if there is no bleeding).  Some medicines may interact with Xarelto and might increase your risk of bleeding while on Xarelto. To help avoid this, consult your healthcare provider or pharmacist prior to using any new prescription or non-prescription medications, including herbals, vitamins, non-steroidal anti-inflammatory drugs (NSAIDs) and supplements.  This website has more information on Xarelto: [email or web address]

## 2016-04-03 NOTE — Progress Notes (Signed)
Physical Therapy Treatment Note    04/03/16 1500  PT Visit Information  Last PT Received On 04/03/16  Assistance Needed +1  History of Present Illness Pt is a 70 year old male s/p R TKA, h/o L TKA  8 years ago  Subjective Data  Subjective Pt mobilizing well and plans to d/c home tomorrow.  Precautions  Precautions Knee;Fall  Precaution Comments able to perform SLR  Restrictions  Other Position/Activity Restrictions WBAT  Pain Assessment  Pain Assessment 0-10  Pain Score 3  Pain Location R knee  Pain Descriptors / Indicators Aching  Pain Intervention(s) Limited activity within patient's tolerance;Monitored during session  Cognition  Arousal/Alertness Awake/alert  Behavior During Therapy WFL for tasks assessed/performed  Overall Cognitive Status Within Functional Limits for tasks assessed  Bed Mobility  General bed mobility comments pt up in recliner on arrival  Transfers  Overall transfer level Needs assistance  Equipment used Rolling walker (2 wheeled)  Transfers Sit to/from Stand  Sit to Stand Min guard  General transfer comment able to recall correct technique  Ambulation/Gait  Ambulation/Gait assistance Min guard;Supervision  Ambulation Distance (Feet) 180 Feet  Assistive device Rolling walker (2 wheeled)  Gait Pattern/deviations Step-through pattern;Antalgic  General Gait Details verbal cues for RW positioning, step length  Total Joint Exercises  Short Arc Quad AROM;Right;10 reps  Heel Slides AAROM;Seated;Right;10 reps  PT - End of Session  Activity Tolerance Patient tolerated treatment well  Patient left Other (comment) (in bathroom, to use pull cord, RN aware)  PT - Assessment/Plan  PT Plan Current plan remains appropriate  PT Frequency (ACUTE ONLY) 7X/week  Follow Up Recommendations Outpatient PT  PT equipment None recommended by PT  PT Goal Progression  Progress towards PT goals Progressing toward goals  PT Time Calculation  PT Start Time (ACUTE ONLY)  1320  PT Stop Time (ACUTE ONLY) 1332  PT Time Calculation (min) (ACUTE ONLY) 12 min  PT General Charges  $$ ACUTE PT VISIT 1 Procedure  PT Treatments  $Gait Training 8-22 mins   Carmelia Bake, PT, DPT 04/03/2016 Pager: [phone]

## 2016-04-03 NOTE — Evaluation (Signed)
Physical Therapy Evaluation Patient Details Name: Micheal [name] MRN: [id number] DOB: [birth year] Today's Date: 04/03/2016   History of Present Illness  Pt is a 70 year old male s/p R TKA, h/o L TKA  8 years ago  Clinical Impression  Pt is s/p R TKA resulting in the deficits listed below (see PT Problem List).  Pt will benefit from skilled PT to increase their independence and safety with mobility to allow discharge to the venue listed below.  Pt mobilizing well POD #1 and plans to d/c home with spouse tomorrow.  Pt plans to start outpatient PT on Friday.     Follow Up Recommendations Outpatient PT    Equipment Recommendations  None recommended by PT    Recommendations for Other Services       Precautions / Restrictions Precautions Precautions: Knee;Fall Precaution Comments: able to perform SLR Restrictions Weight Bearing Restrictions: No Other Position/Activity Restrictions: WBAT      Mobility  Bed Mobility Overal bed mobility: Needs Assistance Bed Mobility: Supine to Sit     Supine to sit: Min assist     General bed mobility comments: pt up in recliner on arrival  Transfers Overall transfer level: Needs assistance Equipment used: Rolling walker (2 wheeled) Transfers: Sit to/from Stand Sit to Stand: Min guard         General transfer comment: verbal cues for UE/LE positioning  Ambulation/Gait Ambulation/Gait assistance: Min guard Ambulation Distance (Feet): 160 Feet Assistive device: Rolling walker (2 wheeled) Gait Pattern/deviations: Step-to pattern;Antalgic     General Gait Details: verbal cues for sequence, RW positioning, step length  Stairs            Wheelchair Mobility    Modified Rankin (Stroke Patients Only)       Balance                                             Pertinent Vitals/Pain Pain Assessment: 0-10 Pain Score: 3  Pain Location: R knee Pain Descriptors / Indicators: Aching Pain Intervention(s):  Limited activity within patient's tolerance;Monitored during session;Repositioned;Ice applied    Home Living Family/patient expects to be discharged to:: Private residence Living Arrangements: Spouse/significant other Available Help at Discharge: Family Type of Home: House Home Access: Stairs to enter Entrance Stairs-Rails: None Entrance Stairs-Number of Steps: 3 Home Layout: Able to live on main level with bedroom/bathroom Home Equipment: Walker - 2 wheels;Cane - single point;Crutches (borrowed)      Prior Function Level of Independence: Independent               Hand Dominance        Extremity/Trunk Assessment   Upper Extremity Assessment: Overall WFL for tasks assessed           Lower Extremity Assessment: RLE deficits/detail RLE Deficits / Details: able to perform SLR, AAROM R knee flexion 93* sitting       Communication   Communication: No difficulties  Cognition Arousal/Alertness: Awake/alert Behavior During Therapy: WFL for tasks assessed/performed Overall Cognitive Status: Within Functional Limits for tasks assessed                      General Comments      Exercises Total Joint Exercises Ankle Circles/Pumps: AROM;Both;10 reps Quad Sets: AROM;Both;10 reps Short Arc Quad: AROM;Right;10 reps Heel Slides: AAROM;Seated;Right;10 reps Hip ABduction/ADduction: AROM;Right;10 reps Straight Leg Raises: AROM;Right;10  reps      Assessment/Plan    PT Assessment Patient needs continued PT services  PT Diagnosis Difficulty walking;Acute pain   PT Problem List Decreased strength;Decreased range of motion;Decreased mobility;Pain;Decreased knowledge of use of DME  PT Treatment Interventions Functional mobility training;Stair training;Gait training;DME instruction;Therapeutic activities;Patient/family education;Therapeutic exercise   PT Goals (Current goals can be found in the Care Plan section) Acute Rehab PT Goals Patient Stated Goal: get back to  being independent PT Goal Formulation: With patient Time For Goal Achievement: 04/06/16 Potential to Achieve Goals: Good    Frequency 7X/week   Barriers to discharge        Co-evaluation               End of Session   Activity Tolerance: Patient tolerated treatment well Patient left: in chair;with call bell/phone within reach;with chair alarm set;with family/visitor present           Time: 1015-1040 PT Time Calculation (min) (ACUTE ONLY): 25 min   Charges:   PT Evaluation $PT Eval Low Complexity: 1 Procedure PT Treatments $Therapeutic Exercise: 8-22 mins   PT G Codes:        Romaldo Saville,KATHrine E 04/03/2016, 12:52 PM Carmelia Bake, PT, DPT 04/03/2016 Pager: [phone]

## 2016-04-03 NOTE — Discharge Summary (Signed)
Physician Discharge Summary   Patient ID: Micheal [name] MRN: [id number] DOB/AGE: 1946/03/12 70 y.o.  Admit date: 04/02/2016 Discharge date:  04/04/16  Primary Diagnosis:  Osteoarthritis  Right knee(s) Admission Diagnoses:  Past Medical History  Diagnosis Date  . Hyperlipidemia   . Hypertension   . Anxiety   . Benign essential tremor     hands  . History of colon polyps   . Sigmoid diverticulosis   . Urge urinary incontinence   . History of kidney stones   . History of prostate cancer urologist-  dr Alinda Money-  currently PSA nondetectable 10/ 2016    s/p  radial prostatectomy w/ nerve sparing 07-17-2007/   stage T1c,  Gleason 3+3=6,  PSA 6.33  . Osteoarthritis     knees  . H/O balanitis     recurrent intermittant-- uses diprolene cream prn  . Synovial hypertrophy of left knee   . GERD (gastroesophageal reflux disease)   . Nocturia   . Mild obstructive sleep apnea     uses mouth guard only  . Complication of anesthesia 1999    spinal with last knee replacement  . Diverticulitis     last Saturday 03/24/16 -tx with antibiotics   Discharge Diagnoses:   Principal Problem:   OA (osteoarthritis) of knee  Estimated body mass index is 28.18 kg/(m^2) as calculated from the following:   Height as of this encounter: '5\' 10"'  (1.778 m).   Weight as of this encounter: 89.075 kg (196 lb 6 oz).  Procedure:  Procedure(s) (LRB): RIGHT TOTAL KNEE ARTHROPLASTY (Right)   Consults: None  HPI: Micheal [name] is a 70 y.o. year old male with end stage OA of his right knee with progressively worsening pain and dysfunction. He has constant pain, with activity and at rest and significant functional deficits with difficulties even with ADLs. He has had extensive non-op management including analgesics, injections of cortisone and viscosupplements, and home exercise program, but remains in significant pain with significant dysfunction. Radiographs show bone on bone arthritis medial and  patellofemoral. He presents now for right Total Knee Arthroplasty.   Laboratory Data: Admission on 04/02/2016  Component Date Value Ref Range Status  . WBC 04/03/2016 22.5* 4.0 - 10.5 K/uL Final  . RBC 04/03/2016 3.97* 4.22 - 5.81 MIL/uL Final  . Hemoglobin 04/03/2016 12.8* 13.0 - 17.0 g/dL Final  . HCT 04/03/2016 36.5* 39.0 - 52.0 % Final  . MCV 04/03/2016 91.9  78.0 - 100.0 fL Final  . MCH 04/03/2016 32.2  26.0 - 34.0 pg Final  . MCHC 04/03/2016 35.1  30.0 - 36.0 g/dL Final  . RDW 04/03/2016 12.7  11.5 - 15.5 % Final  . Platelets 04/03/2016 301  150 - 400 K/uL Final  . Sodium 04/03/2016 136  135 - 145 mmol/L Final  . Potassium 04/03/2016 4.4  3.5 - 5.1 mmol/L Final  . Chloride 04/03/2016 105  101 - 111 mmol/L Final  . CO2 04/03/2016 25  22 - 32 mmol/L Final  . Glucose, Bld 04/03/2016 138* 65 - 99 mg/dL Final  . BUN 04/03/2016 15  6 - 20 mg/dL Final  . Creatinine, Ser 04/03/2016 0.78  0.61 - 1.24 mg/dL Final  . Calcium 04/03/2016 8.4* 8.9 - 10.3 mg/dL Final  . GFR calc non Af Amer 04/03/2016 >60  >60 mL/min Final  . GFR calc Af Amer 04/03/2016 >60  >60 mL/min Final   Comment: (NOTE) The eGFR has been calculated using the CKD EPI equation. This calculation has not been  validated in all clinical situations. eGFR's persistently <60 mL/min signify possible Chronic Kidney Disease.   Georgiann Hahn gap 04/03/2016 6  5 - 15 Final  Hospital Outpatient Visit on 03/26/2016  Component Date Value Ref Range Status  . MRSA, PCR 03/26/2016 NEGATIVE  NEGATIVE Final  . Staphylococcus aureus 03/26/2016 POSITIVE* NEGATIVE Final   Comment:        The Xpert SA Assay (FDA approved for NASAL specimens in patients over 49 years of age), is one component of a comprehensive surveillance program.  Test performance has been validated by [facility] for patients greater than or equal to 64 year old. It is not intended to diagnose infection nor to guide or monitor treatment.   Marland Kitchen aPTT 03/26/2016 31  24 -  37 seconds Final  . WBC 03/26/2016 7.4  4.0 - 10.5 K/uL Final  . RBC 03/26/2016 4.25  4.22 - 5.81 MIL/uL Final  . Hemoglobin 03/26/2016 13.6  13.0 - 17.0 g/dL Final  . HCT 03/26/2016 39.3  39.0 - 52.0 % Final  . MCV 03/26/2016 92.5  78.0 - 100.0 fL Final  . MCH 03/26/2016 32.0  26.0 - 34.0 pg Final  . MCHC 03/26/2016 34.6  30.0 - 36.0 g/dL Final  . RDW 03/26/2016 12.9  11.5 - 15.5 % Final  . Platelets 03/26/2016 279  150 - 400 K/uL Final  . Sodium 03/26/2016 138  135 - 145 mmol/L Final  . Potassium 03/26/2016 3.8  3.5 - 5.1 mmol/L Final  . Chloride 03/26/2016 104  101 - 111 mmol/L Final  . CO2 03/26/2016 26  22 - 32 mmol/L Final  . Glucose, Bld 03/26/2016 126* 65 - 99 mg/dL Final  . BUN 03/26/2016 17  6 - 20 mg/dL Final  . Creatinine, Ser 03/26/2016 0.96  0.61 - 1.24 mg/dL Final  . Calcium 03/26/2016 9.0  8.9 - 10.3 mg/dL Final  . Total Protein 03/26/2016 7.5  6.5 - 8.1 g/dL Final  . Albumin 03/26/2016 3.9  3.5 - 5.0 g/dL Final  . AST 03/26/2016 29  15 - 41 U/L Final  . ALT 03/26/2016 19  17 - 63 U/L Final  . Alkaline Phosphatase 03/26/2016 73  38 - 126 U/L Final  . Total Bilirubin 03/26/2016 0.7  0.3 - 1.2 mg/dL Final  . GFR calc non Af Amer 03/26/2016 >60  >60 mL/min Final  . GFR calc Af Amer 03/26/2016 >60  >60 mL/min Final   Comment: (NOTE) The eGFR has been calculated using the CKD EPI equation. This calculation has not been validated in all clinical situations. eGFR's persistently <60 mL/min signify possible Chronic Kidney Disease.   . Anion gap 03/26/2016 8  5 - 15 Final  . Prothrombin Time 03/26/2016 14.2  11.6 - 15.2 seconds Final  . INR 03/26/2016 1.08  0.00 - 1.49 Final  . ABO/RH(D) 03/26/2016 O POS   Final  . Antibody Screen 03/26/2016 NEG   Final  . Sample Expiration 03/26/2016 04/05/2016   Final  . Color, Urine 03/26/2016 YELLOW  YELLOW Final  . APPearance 03/26/2016 CLEAR  CLEAR Final  . Specific Gravity, Urine 03/26/2016 1.024  1.005 - 1.030 Final  . pH  03/26/2016 5.5  5.0 - 8.0 Final  . Glucose, UA 03/26/2016 NEGATIVE  NEGATIVE mg/dL Final  . Hgb urine dipstick 03/26/2016 NEGATIVE  NEGATIVE Final  . Bilirubin Urine 03/26/2016 NEGATIVE  NEGATIVE Final  . Ketones, ur 03/26/2016 NEGATIVE  NEGATIVE mg/dL Final  . Protein, ur 03/26/2016 NEGATIVE  NEGATIVE mg/dL Final  .  Nitrite 03/26/2016 NEGATIVE  NEGATIVE Final  . Leukocytes, UA 03/26/2016 SMALL* NEGATIVE Final  . Squamous Epithelial / LPF 03/26/2016 0-5* NONE SEEN Final  . WBC, UA 03/26/2016 0-5  0 - 5 WBC/hpf Final  . RBC / HPF 03/26/2016 0-5  0 - 5 RBC/hpf Final  . Bacteria, UA 03/26/2016 RARE* NONE SEEN Final  . Urine-Other 03/26/2016 MUCOUS PRESENT   Final  Admission on 03/24/2016, Discharged on 03/24/2016  Component Date Value Ref Range Status  . WBC 03/24/2016 12.2* 4.0 - 10.5 K/uL Final  . RBC 03/24/2016 4.24  4.22 - 5.81 MIL/uL Final  . Hemoglobin 03/24/2016 13.7  13.0 - 17.0 g/dL Final  . HCT 03/24/2016 39.3  39.0 - 52.0 % Final  . MCV 03/24/2016 92.7  78.0 - 100.0 fL Final  . MCH 03/24/2016 32.3  26.0 - 34.0 pg Final  . MCHC 03/24/2016 34.9  30.0 - 36.0 g/dL Final  . RDW 03/24/2016 13.0  11.5 - 15.5 % Final  . Platelets 03/24/2016 226  150 - 400 K/uL Final  . Neutrophils Relative % 03/24/2016 75   Final  . Neutro Abs 03/24/2016 9.1* 1.7 - 7.7 K/uL Final  . Lymphocytes Relative 03/24/2016 14   Final  . Lymphs Abs 03/24/2016 1.7  0.7 - 4.0 K/uL Final  . Monocytes Relative 03/24/2016 10   Final  . Monocytes Absolute 03/24/2016 1.2* 0.1 - 1.0 K/uL Final  . Eosinophils Relative 03/24/2016 1   Final  . Eosinophils Absolute 03/24/2016 0.2  0.0 - 0.7 K/uL Final  . Basophils Relative 03/24/2016 0   Final  . Basophils Absolute 03/24/2016 0.1  0.0 - 0.1 K/uL Final  . Sodium 03/24/2016 140  135 - 145 mmol/L Final  . Potassium 03/24/2016 4.0  3.5 - 5.1 mmol/L Final  . Chloride 03/24/2016 104  101 - 111 mmol/L Final  . BUN 03/24/2016 15  6 - 20 mg/dL Final  . Creatinine, Ser  03/24/2016 0.80  0.61 - 1.24 mg/dL Final  . Glucose, Bld 03/24/2016 116* 65 - 99 mg/dL Final  . Calcium, Ion 03/24/2016 1.13  1.12 - 1.23 mmol/L Final  . TCO2 03/24/2016 24  0 - 100 mmol/L Final  . Hemoglobin 03/24/2016 13.9  13.0 - 17.0 g/dL Final  . HCT 03/24/2016 41.0  39.0 - 52.0 % Final  . Lipase 03/24/2016 22  11 - 51 U/L Final  . Total Protein 03/24/2016 7.6  6.5 - 8.1 g/dL Final  . Albumin 03/24/2016 4.1  3.5 - 5.0 g/dL Final  . AST 03/24/2016 27  15 - 41 U/L Final  . ALT 03/24/2016 20  17 - 63 U/L Final  . Alkaline Phosphatase 03/24/2016 74  38 - 126 U/L Final  . Total Bilirubin 03/24/2016 0.6  0.3 - 1.2 mg/dL Final  . Bilirubin, Direct 03/24/2016 0.2  0.1 - 0.5 mg/dL Final  . Indirect Bilirubin 03/24/2016 0.4  0.3 - 0.9 mg/dL Final  . Color, Urine 03/24/2016 YELLOW  YELLOW Final  . APPearance 03/24/2016 CLEAR  CLEAR Final  . Specific Gravity, Urine 03/24/2016 1.015  1.005 - 1.030 Final  . pH 03/24/2016 5.5  5.0 - 8.0 Final  . Glucose, UA 03/24/2016 NEGATIVE  NEGATIVE mg/dL Final  . Hgb urine dipstick 03/24/2016 NEGATIVE  NEGATIVE Final  . Bilirubin Urine 03/24/2016 NEGATIVE  NEGATIVE Final  . Ketones, ur 03/24/2016 NEGATIVE  NEGATIVE mg/dL Final  . Protein, ur 03/24/2016 NEGATIVE  NEGATIVE mg/dL Final  . Nitrite 03/24/2016 NEGATIVE  NEGATIVE Final  . Leukocytes,  UA 03/24/2016 NEGATIVE  NEGATIVE Final   MICROSCOPIC NOT DONE ON URINES WITH NEGATIVE PROTEIN, BLOOD, LEUKOCYTES, NITRITE, OR GLUCOSE <1000 mg/dL.  Admission on 02/14/2016, Discharged on 02/14/2016  Component Date Value Ref Range Status  . Sodium 02/14/2016 144  135 - 145 mmol/L Final  . Potassium 02/14/2016 4.7  3.5 - 5.1 mmol/L Final  . Chloride 02/14/2016 103  101 - 111 mmol/L Final  . BUN 02/14/2016 23* 6 - 20 mg/dL Final  . Creatinine, Ser 02/14/2016 0.80  0.61 - 1.24 mg/dL Final  . Glucose, Bld 02/14/2016 90  65 - 99 mg/dL Final  . Calcium, Ion 02/14/2016 1.23  1.13 - 1.30 mmol/L Final  . TCO2 02/14/2016 26   0 - 100 mmol/L Final  . Hemoglobin 02/14/2016 20.1* 13.0 - 17.0 g/dL Final   Comment: QUESTIONABLE RESULTS - CHARGE CREDITED REPEATED TO VERIFY   . HCT 02/14/2016 59.0* 39.0 - 52.0 % Final   Comment: QUESTIONABLE RESULTS - CHARGE CREDITED REPEATED TO VERIFY Performed at [facility]   . Sodium 02/14/2016 143  135 - 145 mmol/L Final  . Potassium 02/14/2016 4.7  3.5 - 5.1 mmol/L Final  . Chloride 02/14/2016 103  101 - 111 mmol/L Final  . BUN 02/14/2016 20  6 - 20 mg/dL Final  . Creatinine, Ser 02/14/2016 0.80  0.61 - 1.24 mg/dL Final  . Glucose, Bld 02/14/2016 91  65 - 99 mg/dL Final  . Calcium, Ion 02/14/2016 1.24  1.13 - 1.30 mmol/L Final  . TCO2 02/14/2016 26  0 - 100 mmol/L Final  . Hemoglobin 02/14/2016 13.9  13.0 - 17.0 g/dL Final  . HCT 02/14/2016 41.0  39.0 - 52.0 % Final     X-Rays:Ct Abdomen Pelvis W Contrast  03/24/2016  CLINICAL DATA:  Lower abdominal pain EXAM: CT ABDOMEN AND PELVIS WITH CONTRAST TECHNIQUE: Multidetector CT imaging of the abdomen and pelvis was performed using the standard protocol following bolus administration of intravenous contrast. CONTRAST:  144m ISOVUE-300 IOPAMIDOL (ISOVUE-300) INJECTION 61% COMPARISON:  04/10/2007 FINDINGS: Calcified granuloma in the right lower lobe Diffuse hepatic steatosis. Calcified granulomata are present in the liver and spleen. Gallbladder, pancreas, and adrenal glands are within normal limits Kidneys are unremarkable. Previously described changes related to left ureteral obstruction have resolved. No ureteral calculi. There is wall thickening and inflammatory changes of a focal section of the sigmoid colon extending into the right lower quadrant. This is associated with diverticuli and findings are most consistent with acute diverticulitis. There is no pneumatosis. There is no extraluminal bowel gas. There is no focal abscess formation. Normal appendix. Unremarkable appearance of the bladder.  Prostate is absent. No  free-fluid.  No abnormal retroperitoneal adenopathy. Atherosclerotic calcification of the aorta. No vertebral compression deformity. Left hemi laminectomy defect at L3-4 and L4-5. 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 IMPRESSION: Acute diverticulitis of the sigmoid colon. No evidence of perforation or abscess. Electronically Signed   By: AMarybelle KillingsM.D.   On: 03/24/2016 08:18   Dg Abd Acute W/chest  03/24/2016  CLINICAL DATA:  Abdominal pain for 2 days and constipation EXAM: DG ABDOMEN ACUTE W/ 1V CHEST COMPARISON:  Chest radiograph July 30, 2014; abdominal radiograph October 14, 2013 FINDINGS: PA chest: There is slight atelectasis in the left base. No edema or consolidation. Heart size and pulmonary vascularity are normal. No adenopathy. Supine and upright abdomen: There is moderate stool throughout the colon. There is no appreciable bowel dilatation. There are scattered air-fluid levels in the right abdomen.  No free air evident. There are small phleboliths in the pelvis. IMPRESSION: Scattered air-fluid levels. Question early ileus or enteritis. Obstruction is felt to be less likely. No free air. Moderate stool in the colon. Mild left lung base atelectasis. Lungs elsewhere clear. Electronically Signed   By: Lowella Grip III M.D.   On: 03/24/2016 08:04    EKG: Orders placed or performed during the hospital encounter of 02/14/16  . EKG 12 lead  . EKG 12 lead     Hospital Course: Micheal [name] is a 70 y.o. who was admitted to [facility]. They were brought to the operating room on 04/02/2016 and underwent Procedure(s): RIGHT TOTAL KNEE ARTHROPLASTY.  Patient tolerated the procedure well and was later transferred to the recovery room and then to the orthopaedic floor for postoperative care.  They were given PO and IV analgesics for pain control following their surgery.  They were given 24 hours of postoperative antibiotics of  Anti-infectives    Start     Dose/Rate Route  Frequency Ordered Stop   04/02/16 1930  ceFAZolin (ANCEF) IVPB 2g/100 mL premix     2 g 200 mL/hr over 30 Minutes Intravenous Every 6 hours 04/02/16 1711 04/03/16 0221   04/02/16 1049  ceFAZolin (ANCEF) IVPB 2g/100 mL premix     2 g 200 mL/hr over 30 Minutes Intravenous On call to O.R. 04/02/16 1049 04/02/16 1310     and started on DVT prophylaxis in the form of Xarelto.   PT and OT were ordered for total joint protocol.  Discharge planning consulted to help with postop disposition and equipment needs.  Patient had a good night on the evening of surgery.  They started to get up OOB with therapy on day one. Hemovac drain was pulled without difficulty.  Continued to work with therapy into day two.  Dressing was changed on day two and the incision was healing well. Patient was seen in rounds and was ready to go home.  DC home Diet - Cardiac diet Follow up - in 2 weeks Activity - WBAT Disposition - Home Condition Upon Discharge - Stable D/C Meds - See DC Summary DVT Prophylaxis - Xarelto   Spoke with Dr. Delma Officer.  Patient was stable at time of discharge without any abdominal complaints.  Discharge Instructions    Call MD / Call 911    Complete by:  As directed   If you experience chest pain or shortness of breath, CALL 911 and be transported to the hospital emergency room.  If you develope a fever above 101 F, pus (white drainage) or increased drainage or redness at the wound, or calf pain, call your surgeon's office.     Change dressing    Complete by:  As directed   Change dressing daily with sterile 4 x 4 inch gauze dressing and apply TED hose. Do not submerge the incision under water.     Constipation Prevention    Complete by:  As directed   Drink plenty of fluids.  Prune juice may be helpful.  You may use a stool softener, such as Colace (over the counter) 100 mg twice a day.  Use MiraLax (over the counter) for constipation as needed.     Diet - low sodium heart healthy    Complete  by:  As directed      Discharge instructions    Complete by:  As directed   Pick up stool softner and laxative for home use following surgery while  on pain medications. Do not submerge incision under water. Please use good hand washing techniques while changing dressing each day. May shower starting three days after surgery. Please use a clean towel to pat the incision dry following showers. Continue to use ice for pain and swelling after surgery. Do not use any lotions or creams on the incision until instructed by your surgeon.  Take Xarelto for two and a half more weeks, then discontinue Xarelto. Once the patient has completed the Xarelto, they may resume the 81 mg Aspirin.  Postoperative Constipation Protocol  Constipation - defined medically as fewer than three stools per week and severe constipation as less than one stool per week.  One of the most common issues patients have following surgery is constipation.  Even if you have a regular bowel pattern at home, your normal regimen is likely to be disrupted due to multiple reasons following surgery.  Combination of anesthesia, postoperative narcotics, change in appetite and fluid intake all can affect your bowels.  In order to avoid complications following surgery, here are some recommendations in order to help you during your recovery period.  Colace (docusate) - Pick up an over-the-counter form of Colace or another stool softener and take twice a day as long as you are requiring postoperative pain medications.  Take with a full glass of water daily.  If you experience loose stools or diarrhea, hold the colace until you stool forms back up.  If your symptoms do not get better within 1 week or if they get worse, check with your doctor.  Dulcolax (bisacodyl) - Pick up over-the-counter and take as directed by the product packaging as needed to assist with the movement of your bowels.  Take with a full glass of water.  Use this product as  needed if not relieved by Colace only.   MiraLax (polyethylene glycol) - Pick up over-the-counter to have on hand.  MiraLax is a solution that will increase the amount of water in your bowels to assist with bowel movements.  Take as directed and can mix with a glass of water, juice, soda, coffee, or tea.  Take if you go more than two days without a movement. Do not use MiraLax more than once per day. Call your doctor if you are still constipated or irregular after using this medication for 7 days in a row.  If you continue to have problems with postoperative constipation, please contact the office for further assistance and recommendations.  If you experience "the worst abdominal pain ever" or develop nausea or vomiting, please contact the office immediatly for further recommendations for treatment.     Do not put a pillow under the knee. Place it under the heel.    Complete by:  As directed      Do not sit on low chairs, stoools or toilet seats, as it may be difficult to get up from low surfaces    Complete by:  As directed      Driving restrictions    Complete by:  As directed   No driving until released by the physician.     Increase activity slowly as tolerated    Complete by:  As directed      Lifting restrictions    Complete by:  As directed   No lifting until released by the physician.     Patient may shower    Complete by:  As directed   You may shower without a dressing once there is no drainage.  Do not wash over the wound.  If drainage remains, do not shower until drainage stops.     TED hose    Complete by:  As directed   Use stockings (TED hose) for 3 weeks on both leg(s).  You may remove them at night for sleeping.     Weight bearing as tolerated    Complete by:  As directed   Laterality:  right  Extremity:  Lower            Medication List    STOP taking these medications        ALIGN PO     ALKA-SELTZER ANTACID PO     aspirin EC 81 MG tablet      HYDROcodone-acetaminophen 5-325 MG tablet  Commonly known as:  NORCO     ibuprofen 200 MG tablet  Commonly known as:  ADVIL,MOTRIN     multivitamin tablet      TAKE these medications        acetaminophen 500 MG tablet  Commonly known as:  TYLENOL  Take 1,000 mg by mouth every 6 (six) hours as needed for mild pain.     ALPRAZolam 0.5 MG tablet  Commonly known as:  XANAX  Take 1 tablet (0.5 mg total) by mouth every 8 (eight) hours as needed for anxiety. 1/2 to 1 tab by mouth as needed for anxiety     atorvastatin 20 MG tablet  Commonly known as:  LIPITOR  Take 1 tablet (20 mg total) by mouth daily at 6 PM.     ciprofloxacin 500 MG tablet  Commonly known as:  CIPRO  Take 1 tablet (500 mg total) by mouth 2 (two) times daily. One po bid x 7 days     methocarbamol 500 MG tablet  Commonly known as:  ROBAXIN  Take 1 tablet (500 mg total) by mouth every 6 (six) hours as needed for muscle spasms.     metroNIDAZOLE 500 MG tablet  Commonly known as:  FLAGYL  Take 1 tablet (500 mg total) by mouth 2 (two) times daily.     olmesartan 20 MG tablet  Commonly known as:  BENICAR  Take 1 tablet (20 mg total) by mouth every evening.     ondansetron 4 MG tablet  Commonly known as:  ZOFRAN  Take 1 tablet (4 mg total) by mouth every 6 (six) hours as needed for nausea.     oxyCODONE 5 MG immediate release tablet  Commonly known as:  Oxy IR/ROXICODONE  Take 1-2 tablets (5-10 mg total) by mouth every 3 (three) hours as needed for moderate pain or severe pain.     polyethylene glycol packet  Commonly known as:  MIRALAX / GLYCOLAX  Take 17 g by mouth 2 (two) times daily.     rivaroxaban 10 MG Tabs tablet  Commonly known as:  XARELTO  Take 1 tablet (10 mg total) by mouth daily with breakfast. Take Xarelto for two and a half more weeks, then discontinue Xarelto. Once the patient has completed the Xarelto, they may resume the 81 mg Aspirin.     sertraline 50 MG tablet  Commonly known as:   ZOLOFT  Take 1 tablet (50 mg total) by mouth at bedtime.     traMADol 50 MG tablet  Commonly known as:  ULTRAM  Take 1-2 tablets (50-100 mg total) by mouth every 6 (six) hours as needed (mild pain).           Follow-up Information  Follow up with [place].   Why:  3n1   Contact information:   [address] [place] [address] [phone]       Follow up with [name], MD On 04/17/2016.   Specialty:  Orthopedic Surgery   Why:  Call for appointment for Tuesday 04/17/2016 with Dr. Wynelle Link.   Contact information:   [address] [place] [address] [phone]       Signed: [name], PA-C Orthopaedic Surgery 04/03/2016, 9:46 PM

## 2016-04-04 LAB — CBC
HEMATOCRIT: 32.9 % — AB (ref 39.0–52.0)
HEMOGLOBIN: 11.4 g/dL — AB (ref 13.0–17.0)
MCH: 32.3 pg (ref 26.0–34.0)
MCHC: 34.7 g/dL (ref 30.0–36.0)
MCV: 93.2 fL (ref 78.0–100.0)
PLATELETS: 285 10*3/uL (ref 150–400)
RBC: 3.53 MIL/uL — AB (ref 4.22–5.81)
RDW: 13.3 % (ref 11.5–15.5)
WBC: 25.6 10*3/uL — AB (ref 4.0–10.5)

## 2016-04-04 LAB — BASIC METABOLIC PANEL
ANION GAP: 4 — AB (ref 5–15)
BUN: 14 mg/dL (ref 6–20)
CHLORIDE: 106 mmol/L (ref 101–111)
CO2: 29 mmol/L (ref 22–32)
Calcium: 8.5 mg/dL — ABNORMAL LOW (ref 8.9–10.3)
Creatinine, Ser: 0.8 mg/dL (ref 0.61–1.24)
GFR calc Af Amer: >60 mL/min (ref >60)
GLUCOSE: 155 mg/dL — AB (ref 65–99)
POTASSIUM: 3.9 mmol/L (ref 3.5–5.1)
Sodium: 139 mmol/L (ref 135–145)

## 2016-04-04 NOTE — Progress Notes (Signed)
Physical Therapy Treatment Patient Details Name: Micheal [name] MRN: [id number] DOB: 05/20/1946 Today's Date: 04/04/2016    History of Present Illness Pt is a 70 year old male s/p R TKA, h/o L TKA  8 years ago    PT Comments    POD # 2 Assisted with amb in hallway then practiced stairs then returned to room to perform TKR TE's following HEP handout.   Follow Up Recommendations  Outpatient PT     Equipment Recommendations  None recommended by PT    Recommendations for Other Services       Precautions / Restrictions Precautions Precautions: Knee;Fall Precaution Comments: able to perform SLR Restrictions Weight Bearing Restrictions: No Other Position/Activity Restrictions: WBAT    Mobility  Bed Mobility               General bed mobility comments: pt up in recliner on arrival  Transfers Overall transfer level: Needs assistance Equipment used: Rolling walker (2 wheeled) Transfers: Sit to/from Stand Sit to Stand: Supervision         General transfer comment: increased time and one VC safety with turns  Ambulation/Gait Ambulation/Gait assistance: Supervision Ambulation Distance (Feet): 85 Feet Assistive device: Rolling walker (2 wheeled) Gait Pattern/deviations: Step-to pattern;Step-through pattern Gait velocity: decreased   General Gait Details: decreased amb distance today due to increased c/o pain   Stairs Stairs: Yes Stairs assistance: Min assist Stair Management: No rails;Step to pattern;Backwards;With walker Number of Stairs: 4 General stair comments: 50% VC's on proper tech and safety  Wheelchair Mobility    Modified Rankin (Stroke Patients Only)       Balance                                    Cognition                            Exercises   Total Knee Replacement TE's 10 reps B LE ankle pumps 10 reps towel squeezes 10 reps knee presses 10 reps heel slides  10 reps SAQ's 10 reps SLR's 10 reps  ABD Followed by ICE    General Comments        Pertinent Vitals/Pain Pain Assessment: 0-10 Pain Score: 6  Pain Location: R knee Pain Descriptors / Indicators: Sore;Tender Pain Intervention(s): Monitored during session;Premedicated before session;Repositioned;Ice applied    Home Living                      Prior Function            PT Goals (current goals can now be found in the care plan section) Progress towards PT goals: Progressing toward goals    Frequency  7X/week    PT Plan Current plan remains appropriate    Co-evaluation             End of Session Equipment Utilized During Treatment: Gait belt Activity Tolerance: Patient tolerated treatment well Patient left: in chair;with call bell/phone within reach;with family/visitor present     Time: [id number] PT Time Calculation (min) (ACUTE ONLY): 42 min  Charges:  $Gait Training: 8-22 mins $Therapeutic Exercise: 8-22 mins $Therapeutic Activity: 8-22 mins                    G Codes:      Rica Koyanagi  PTA [facility]  Acute  Rehab Pager  [phone]  

## 2016-04-04 NOTE — Progress Notes (Signed)
RN reviewed discharge instructions with patient and family. All questions answered.   Paperwork and prescriptions given to patient.   NT rolled patient down with all belongings to family car.  

## 2016-04-04 NOTE — Progress Notes (Signed)
   Subjective: 2 Days Post-Op Procedure(s) (LRB): RIGHT TOTAL KNEE ARTHROPLASTY (Right) Patient reports pain as mild.   Patient seen in rounds with Dr. Wynelle Link. Patient is well, and has had no acute complaints or problems Patient is ready to go home  Objective: Vital signs in last 24 hours: Temp:  [98.1 F (36.7 C)-98.7 F (37.1 C)] 98.1 F (36.7 C) (07/19 0625) Pulse Rate:  [65-88] 88 (07/19 0625) Resp:  [16-18] 16 (07/19 0625) BP: (94-156)/(64-80) 107/64 mmHg (07/19 0625) SpO2:  [96 %-100 %] 100 % (07/19 0625)  Intake/Output from previous day:  Intake/Output Summary (Last 24 hours) at 04/04/16 0836 Last data filed at 04/04/16 0809  Gross per 24 hour  Intake 1698.33 ml  Output   1550 ml  Net 148.33 ml    Intake/Output this shift: Total I/O In: 240 [P.O.:240] Out: -   Labs:  Recent Labs  04/03/16 0414 04/04/16 0421  HGB 12.8* 11.4*    Recent Labs  04/03/16 0414 04/04/16 0421  WBC 22.5* 25.6*  RBC 3.97* 3.53*  HCT 36.5* 32.9*  PLT 301 285    Recent Labs  04/03/16 0414 04/04/16 0421  NA 136 139  K 4.4 3.9  CL 105 106  CO2 25 29  BUN 15 14  CREATININE 0.78 0.80  GLUCOSE 138* 155*  CALCIUM 8.4* 8.5*   No results for input(s): LABPT, INR in the last 72 hours.  EXAM: General - Patient is Alert, Appropriate and Oriented Extremity - Neurovascular intact Sensation intact distally Dorsiflexion/Plantar flexion intact Incision - clean, dry, no drainage Motor Function - intact, moving foot and toes well on exam.   Assessment/Plan: 2 Days Post-Op Procedure(s) (LRB): RIGHT TOTAL KNEE ARTHROPLASTY (Right) Procedure(s) (LRB): RIGHT TOTAL KNEE ARTHROPLASTY (Right) Past Medical History  Diagnosis Date  . Hyperlipidemia   . Hypertension   . Anxiety   . Benign essential tremor     hands  . History of colon polyps   . Sigmoid diverticulosis   . Urge urinary incontinence   . History of kidney stones   . History of prostate cancer urologist-  dr  Alinda Money-  currently PSA nondetectable 10/ 2016    s/p  radial prostatectomy w/ nerve sparing 07-17-2007/   stage T1c,  Gleason 3+3=6,  PSA 6.33  . Osteoarthritis     knees  . H/O balanitis     recurrent intermittant-- uses diprolene cream prn  . Synovial hypertrophy of left knee   . GERD (gastroesophageal reflux disease)   . Nocturia   . Mild obstructive sleep apnea     uses mouth guard only  . Complication of anesthesia 1999    spinal with last knee replacement  . Diverticulitis     last Saturday 03/24/16 -tx with antibiotics   Principal Problem:   OA (osteoarthritis) of knee  Estimated body mass index is 28.18 kg/(m^2) as calculated from the following:   Height as of this encounter: 5\' 10"  (1.778 m).   Weight as of this encounter: 89.075 kg (196 lb 6 oz). DC home Diet - Cardiac diet Follow up - in 2 weeks Activity - WBAT Disposition - Home Condition Upon Discharge - Stable D/C Meds - See DC Summary DVT Prophylaxis - Xarelto   Spoke with Dr. Delma Officer.  Patient was stable at time of discharge without any abdominal complaints.  [name], PA-C Orthopaedic Surgery 04/04/2016, 8:36 AM

## 2016-04-04 NOTE — Progress Notes (Signed)
Physical Therapy Treatment Patient Details Name: Micheal [name] MRN: [id number] DOB: 1946/04/10 Today's Date: 04/04/2016    History of Present Illness Pt is a 70 year old male s/p R TKA, h/o L TKA  8 years ago    PT Comments    POD # 2 Spouse arrived so practiced stairs with pt and spouse under dierection of therapist on proper tech and sequencing.  Performed twice.  Handout also given.  Follow Up Recommendations  Outpatient PT     Equipment Recommendations  None recommended by PT    Recommendations for Other Services       Precautions / Restrictions Precautions Precautions: Knee;Fall Precaution Comments: able to perform SLR Restrictions Weight Bearing Restrictions: No Other Position/Activity Restrictions: WBAT       Balance                                    Cognition                            Exercises      General Comments        Pertinent Vitals/Pain Pain Assessment: 0-10 Pain Score: 6  Pain Location: R knee Pain Descriptors / Indicators: Sore;Tender Pain Intervention(s): Monitored during session;Premedicated before session;Repositioned;Ice applied    Home Living  practiced twice up stairs backward with walker with spouse                    Prior Function            PT Goals (current goals can now be found in the care plan section) Progress towards PT goals: Progressing toward goals    Frequency  7X/week    PT Plan Current plan remains appropriate    Co-evaluation             End of Session Equipment Utilized During Treatment: Gait belt Activity Tolerance: Patient tolerated treatment well Patient left: in chair;with call bell/phone within reach;with family/visitor present     Time: [id number] PT Time Calculation (min) (ACUTE ONLY): 12 min  Charges:  $Gait Training: 8-22 min                     G Codes:      Rica Koyanagi  PTA [facility]  Acute  Rehab Pager      [phone]

## 2016-04-06 DIAGNOSIS — Z471 Aftercare following joint replacement surgery: Secondary | ICD-10-CM | POA: Diagnosis not present

## 2016-04-06 DIAGNOSIS — M25569 Pain in unspecified knee: Secondary | ICD-10-CM | POA: Diagnosis not present

## 2016-04-06 DIAGNOSIS — M25561 Pain in right knee: Secondary | ICD-10-CM | POA: Diagnosis not present

## 2016-04-09 DIAGNOSIS — M25561 Pain in right knee: Secondary | ICD-10-CM | POA: Diagnosis not present

## 2016-04-11 DIAGNOSIS — M25561 Pain in right knee: Secondary | ICD-10-CM | POA: Diagnosis not present

## 2016-04-13 DIAGNOSIS — M25561 Pain in right knee: Secondary | ICD-10-CM | POA: Diagnosis not present

## 2016-04-16 DIAGNOSIS — M25561 Pain in right knee: Secondary | ICD-10-CM | POA: Diagnosis not present

## 2016-04-17 DIAGNOSIS — Z96651 Presence of right artificial knee joint: Secondary | ICD-10-CM | POA: Diagnosis not present

## 2016-04-17 DIAGNOSIS — Z471 Aftercare following joint replacement surgery: Secondary | ICD-10-CM | POA: Diagnosis not present

## 2016-04-18 DIAGNOSIS — M25561 Pain in right knee: Secondary | ICD-10-CM | POA: Diagnosis not present

## 2016-04-20 DIAGNOSIS — M25561 Pain in right knee: Secondary | ICD-10-CM | POA: Diagnosis not present

## 2016-04-23 ENCOUNTER — Telehealth: Payer: Self-pay | Admitting: Family Medicine

## 2016-04-23 DIAGNOSIS — M25561 Pain in right knee: Secondary | ICD-10-CM | POA: Diagnosis not present

## 2016-04-25 DIAGNOSIS — M25561 Pain in right knee: Secondary | ICD-10-CM | POA: Diagnosis not present

## 2016-04-25 MED ORDER — ALPRAZOLAM 0.5 MG PO TABS: 0.5000 mg | ORAL_TABLET | Freq: Three times a day (TID) | ORAL | 0 refills | Status: DC | PRN

## 2016-04-27 DIAGNOSIS — M25561 Pain in right knee: Secondary | ICD-10-CM | POA: Diagnosis not present

## 2016-04-30 DIAGNOSIS — M25561 Pain in right knee: Secondary | ICD-10-CM | POA: Diagnosis not present

## 2016-04-30 NOTE — Telephone Encounter (Signed)
Pt need new Rx Xanax  Pharm:  [facility] [place].

## 2016-04-30 NOTE — Telephone Encounter (Signed)
Would confirm whether he had filled then.   He does not take this regularly.  Would not refill this soon.

## 2016-04-30 NOTE — Telephone Encounter (Signed)
Looks like we just filled this on 04/25/2016 #30 Please advise

## 2016-05-01 NOTE — Telephone Encounter (Signed)
Pt never never picked up medication on 04/26/16 so its at the pharmacy waiting for him. He is aware.

## 2016-05-02 DIAGNOSIS — M25561 Pain in right knee: Secondary | ICD-10-CM | POA: Diagnosis not present

## 2016-05-04 DIAGNOSIS — M25561 Pain in right knee: Secondary | ICD-10-CM | POA: Diagnosis not present

## 2016-05-07 DIAGNOSIS — M25561 Pain in right knee: Secondary | ICD-10-CM | POA: Diagnosis not present

## 2016-05-08 DIAGNOSIS — Z471 Aftercare following joint replacement surgery: Secondary | ICD-10-CM | POA: Diagnosis not present

## 2016-05-08 DIAGNOSIS — Z96651 Presence of right artificial knee joint: Secondary | ICD-10-CM | POA: Diagnosis not present

## 2016-05-09 DIAGNOSIS — M25561 Pain in right knee: Secondary | ICD-10-CM | POA: Diagnosis not present

## 2016-05-11 DIAGNOSIS — M25561 Pain in right knee: Secondary | ICD-10-CM | POA: Diagnosis not present

## 2016-06-12 DIAGNOSIS — Z471 Aftercare following joint replacement surgery: Secondary | ICD-10-CM | POA: Diagnosis not present

## 2016-06-12 DIAGNOSIS — Z96651 Presence of right artificial knee joint: Secondary | ICD-10-CM | POA: Diagnosis not present

## 2016-06-20 DIAGNOSIS — Z23 Encounter for immunization: Secondary | ICD-10-CM | POA: Diagnosis not present

## 2016-10-02 ENCOUNTER — Ambulatory Visit (INDEPENDENT_AMBULATORY_CARE_PROVIDER_SITE_OTHER): Payer: Medicare Other | Admitting: Family Medicine

## 2016-10-02 ENCOUNTER — Encounter: Payer: Self-pay | Admitting: Family Medicine

## 2016-10-02 VITALS — BP 120/80 | HR 88 | Temp 98.1°F | Ht 70.0 in | Wt 199.4 lb

## 2016-10-02 DIAGNOSIS — M545 Low back pain: Secondary | ICD-10-CM

## 2016-10-02 MED ORDER — PREDNISONE 10 MG PO TABS: ORAL_TABLET | ORAL | 0 refills | Status: DC

## 2016-10-02 NOTE — Patient Instructions (Signed)
Low Back Sprain A sprain is a stretch or tear in the bands of tissue that hold bones and joints together (ligaments). Sprains of the lower back (lumbar spine) are a common cause of low back pain. A sprain occurs when ligaments are overextended or stretched beyond their limits. The ligaments can become inflamed, resulting in pain and sudden muscle tightening (spasms). A sprain can be caused by an injury (trauma), or it can develop gradually due to overuse. There are three types of sprains:  Grade 1 is a mild sprain involving an overstretched ligament or a very slight tear of the ligament.  Grade 2 is a moderate sprain involving a partial tear of the ligament.  Grade 3 is a severe sprain involving a complete tear of the ligament. What are the causes? This condition may be caused by:  Trauma, such as a fall or a hit to the body.  Twisting or overstretching the back. This may result from doing activities that require a lot of energy, such as lifting heavy objects. What increases the risk? The following factors may increase your risk of getting this condition:  Playing contact sports.  Participating in sports or activities that put excessive stress on the back and require a lot of bending and twisting, including:  Lifting weights or heavy objects.  Gymnastics.  Soccer.  Figure skating.  Snowboarding.  Being overweight or obese.  Having poor strength and flexibility. What are the signs or symptoms? Symptoms of this condition may include:  Sharp or dull pain in the lower back that does not go away. Pain may extend to the buttocks.  Stiffness.  Limited range of motion.  Inability to stand up straight due to stiffness or pain.  Muscle spasms. How is this diagnosed?   This condition may be diagnosed based on:  Your symptoms.  Your medical history.  A physical exam.  Your health care provider may push on certain areas of your back to determine the source of your  pain.  You may be asked to bend forward, backward, and side to side to assess the severity of your pain and your range of motion.  Imaging tests, such as:  X-rays.  MRI. How is this treated? Treatment for this condition may include:  Applying heat and cold to the affected area.  Medicines to help relieve pain and to relax your muscles (muscle relaxants).  NSAIDs to help reduce swelling and discomfort.  Physical therapy. When your symptoms improve, it is important to gradually return to your normal routine as soon as possible to reduce pain, avoid stiffness, and avoid loss of muscle strength. Generally, symptoms should improve within 6 weeks of treatment. However, recovery time varies. Follow these instructions at home: Managing pain, stiffness, and swelling   If directed, apply ice to the injured area during the first 24 hours after your injury.  Put ice in a plastic bag.  Place a towel between your skin and the bag.  Leave the ice on for 20 minutes, 2-3 times a day.  If directed, apply heat to the affected area as often as told by your health care provider. Use the heat source that your health care provider recommends, such as a moist heat pack or a heating pad.  Place a towel between your skin and the heat source.  Leave the heat on for 20-30 minutes.  Remove the heat if your skin turns bright red. This is especially important if you are unable to feel pain, heat, or cold. You   may have a greater risk of getting burned. Activity   Rest and return to your normal activities as told by your health care provider. Ask your health care provider what activities are safe for you.  Avoid activities that take a lot of effort (are strenuous) for as long as told by your health care provider.  Do exercises as told by your health care provider. General instructions    Take over-the-counter and prescription medicines only as told by your health care provider.  If you have  questions or concerns about safety while taking pain medicine, talk with your health care provider.  Do not drive or operate heavy machinery until you know how your pain medicine affects you.  Do not use any tobacco products, such as cigarettes, chewing tobacco, and e-cigarettes. Tobacco can delay bone healing. If you need help quitting, ask your health care provider.  Keep all follow-up visits as told by your health care provider. This is important. How is this prevented?  Warm up and stretch before being active.  Cool down and stretch after being active.  Give your body time to rest between periods of activity.  Avoid:  Being physically inactive for long periods at a time.  Exercising or playing sports when you are tired or in pain.  Use correct form when playing sports and lifting heavy objects.  Use good posture when sitting and standing.  Maintain a healthy weight.  Sleep on a mattress with medium firmness to support your back.  Make sure to use equipment that fits you, including shoes that fit well.  Be safe and responsible while being active to avoid falls.  Do at least 150 minutes of moderate-intensity exercise each week, such as brisk walking or water aerobics. Try a form of exercise that takes stress off your back, such as swimming or stationary cycling.  Maintain physical fitness, including:  Strength. In particular, develop and maintain strong abdominal muscles.  Flexibility.  Cardiovascular fitness.  Endurance. Contact a health care provider if:  Your back pain does not improve after 6 weeks of treatment.  Your symptoms get worse. Get help right away if:  Your back pain is severe.  You are unable to stand or walk.  You develop pain in your legs.  You develop weakness in your buttocks or legs.  You have difficulty controlling when you urinate or when you have a bowel movement. This information is not intended to replace advice given to you by  your health care provider. Make sure you discuss any questions you have with your health care provider. Document Released: 09/03/2005 Document Revised: 05/10/2016 Document Reviewed: 06/15/2015 Elsevier Interactive Patient Education  2017 [employer]  

## 2016-10-02 NOTE — Progress Notes (Addendum)
Subjective:     Patient ID: Micheal [name], male   DOB: [birth year], 71 y.o.   MRN: [id number]  HPI Patient seen with low back pain. Started a week ago. He was getting changed and noticed sudden pain left lower back. Pain is relatively constant and severe at times up to 9 out of 10 severity. He notices pain at rest but worse with movement. He's tried multiple things including heat, Tylenol, ibuprofen, leftover methocarbamol, tramadol, and hydrocodone without much relief. Denies any fevers or chills. No appetite or weight changes. No urine or stool incontinence.  . No radiculitis symptoms. Denies any lower extremity numbness or weakness.  Past Medical History:  Diagnosis Date  . Anxiety   . Benign essential tremor    hands  . Complication of anesthesia 1999   spinal with last knee replacement  . Diverticulitis    last Saturday 03/24/16 -tx with antibiotics  . GERD (gastroesophageal reflux disease)   . H/O balanitis    recurrent intermittant-- uses diprolene cream prn  . History of colon polyps   . History of kidney stones   . History of prostate cancer urologist-  dr Alinda Money-  currently PSA nondetectable 10/ 2016   s/p  radial prostatectomy w/ nerve sparing 07-17-2007/   stage T1c,  Gleason 3+3=6,  PSA 6.33  . Hyperlipidemia   . Hypertension   . Mild obstructive sleep apnea    uses mouth guard only  . Nocturia   . Osteoarthritis    knees  . Sigmoid diverticulosis   . Synovial hypertrophy of left knee   . Urge urinary incontinence    Past Surgical History:  Procedure Laterality Date  . BACK SURGERY     L4 surgery  . CARDIOVASCULAR STRESS TEST  08-04-2014   normal perfusion nuclear study/  normal LV function and wall motion , ef 54%  . COLONOSCOPY W/ POLYPECTOMY  06-21-2014  . JOINT REPLACEMENT    . KNEE ARTHROSCOPY Bilateral right 07-31-2009//  left   . KNEE ARTHROSCOPY Left 12/08/2014   Procedure: ARTHROSCOPY LEFT KNEE WITH SYNOVECTOMY;  Surgeon: Gaynelle Arabian, MD;  Location:  [facility];  Service: Orthopedics;  Laterality: Left;  . KNEE ARTHROSCOPY Left 02/14/2016   Procedure: ARTHROSCOPY KNEE WTH SYNOVECTOMY;  Surgeon: Gaynelle Arabian, MD;  Location: [facility];  Service: Orthopedics;  Laterality: Left;  . LUMBAR DISC SURGERY  04-13-2002   left  L4 -- L5  . ROBOT ASSISTED LAPAROSCOPIC RADICAL PROSTATECTOMY  07-17-2007  . TONSILLECTOMY  as child  . TOTAL KNEE ARTHROPLASTY Left 03-08-2008  . TOTAL KNEE ARTHROPLASTY Right 04/02/2016   Procedure: RIGHT TOTAL KNEE ARTHROPLASTY;  Surgeon: Gaynelle Arabian, MD;  Location: [facility];  Service: Orthopedics;  Laterality: Right;    reports that he has never smoked. He has never used smokeless tobacco. He reports that he drinks alcohol. He reports that he does not use drugs. family history includes Aneurysm in his father; Sudden death in his mother. No Known Allergies   Review of Systems  Constitutional: Negative for chills and fever.  Respiratory: Negative for shortness of breath.   Gastrointestinal: Negative for abdominal pain.  Genitourinary: Negative for dysuria.  Musculoskeletal: Positive for back pain.  Neurological: Negative for weakness and numbness.       Objective:   Physical Exam  Constitutional: He appears well-developed and well-nourished.  Cardiovascular: Normal rate and regular rhythm.   Pulmonary/Chest: Effort normal and breath sounds normal. No respiratory distress. He has no wheezes. He has  no rales.  Musculoskeletal: He exhibits no edema.  Left low back pain with raising the left leg.    Neurological:  Slightly diminished reflex left ankle and knee c/w right Full strength with plantar and dorsi flexion.       Assessment:     Left lower lumbar back pain. He does have slightly diminished reflux left ankle and left knee compared to the right otherwise neuro exam is intact. Full strength    Plan:     -Trial of prednisone taper -May continue with methocarbamol at  night as needed -Follow-up immediately for any numbness or weakness or progressive pain -Touch base in one week if not improved  [name] MD [facility]  MRI results: IMPRESSION: Mild spinal stenosis L3-4. Laminotomy on the left. Subarticular and foraminal narrowing bilaterally due to spurring.  Mild spinal stenosis L4-5. Subarticular stenosis bilaterally, right greater than left. Small extraforaminal disc protrusion on the left causing displacement of the left L4 nerve root  Advanced disc degeneration at L5-S1 with diffuse spurring and mild subarticular and foraminal stenosis bilaterally.   Electronically Signed   By: Franchot Gallo M.D.   On: 10/13/2016 09:02  Pt was notified of results.  Not much improvement with prednisone.  Will set up neurosurgical referral.  [name] MD [facility]

## 2016-10-02 NOTE — Progress Notes (Signed)
Pre visit review using our clinic review tool, if applicable. No additional management support is needed unless otherwise documented below in the visit note. 

## 2016-10-09 ENCOUNTER — Telehealth: Payer: Self-pay | Admitting: Family Medicine

## 2016-10-09 DIAGNOSIS — M5442 Lumbago with sciatica, left side: Secondary | ICD-10-CM

## 2016-10-09 NOTE — Telephone Encounter (Signed)
Would go ahead with MRI lumbar spine without contrast if pain no better.

## 2016-10-09 NOTE — Telephone Encounter (Signed)
FYI: update on patient.

## 2016-10-09 NOTE — Telephone Encounter (Signed)
Pt states the prednisone prescribed has resulted in little/no improvement with back pain and back spasms. Pt still has difficulty walking.  [facility]/ [address]

## 2016-10-10 NOTE — Telephone Encounter (Signed)
Pt is aware.  

## 2016-10-10 NOTE — Telephone Encounter (Signed)
Order entered for testing.

## 2016-10-13 ENCOUNTER — Ambulatory Visit
Admission: RE | Admit: 2016-10-13 | Discharge: 2016-10-13 | Disposition: A | Discharge status: Home / Self Care | Payer: Medicare Other | Source: Ambulatory Visit | Attending: Family Medicine | Admitting: Family Medicine

## 2016-10-13 DIAGNOSIS — M5442 Lumbago with sciatica, left side: Secondary | ICD-10-CM

## 2016-10-13 DIAGNOSIS — M48061 Spinal stenosis, lumbar region without neurogenic claudication: Secondary | ICD-10-CM | POA: Diagnosis not present

## 2016-10-16 ENCOUNTER — Other Ambulatory Visit: Payer: Self-pay

## 2016-10-16 DIAGNOSIS — R937 Abnormal findings on diagnostic imaging of other parts of musculoskeletal system: Secondary | ICD-10-CM

## 2016-10-17 ENCOUNTER — Telehealth: Payer: Self-pay | Admitting: Family Medicine

## 2016-10-17 NOTE — Telephone Encounter (Signed)
Pt states the neuro surgeon he used to see and was referred to has retired.  Pt would like to know who Dr Elease Hashimoto would recommend. Pt would like Dr to call him and discuss.

## 2016-10-18 DIAGNOSIS — Z471 Aftercare following joint replacement surgery: Secondary | ICD-10-CM | POA: Diagnosis not present

## 2016-10-18 DIAGNOSIS — Z96653 Presence of artificial knee joint, bilateral: Secondary | ICD-10-CM | POA: Diagnosis not present

## 2016-10-18 DIAGNOSIS — Z96651 Presence of right artificial knee joint: Secondary | ICD-10-CM | POA: Diagnosis not present

## 2016-10-18 DIAGNOSIS — Z96652 Presence of left artificial knee joint: Secondary | ICD-10-CM | POA: Diagnosis not present

## 2016-10-18 NOTE — Telephone Encounter (Signed)
I spoke with patient and will refer to Dr Kary Kos.

## 2016-10-18 NOTE — Addendum Note (Signed)
Addended by: [name] on: 10/18/2016 12:28 PM   Modules accepted: Orders

## 2016-10-19 ENCOUNTER — Telehealth: Payer: Self-pay | Admitting: Family Medicine

## 2016-10-19 NOTE — Telephone Encounter (Signed)
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

## 2016-10-19 NOTE — Telephone Encounter (Signed)
Didn't you already speak with this pt yesterday?

## 2016-10-19 NOTE — Telephone Encounter (Signed)
I called him back- no answer.  Left message.

## 2016-10-22 MED ORDER — HYDROCODONE-ACETAMINOPHEN 5-325 MG PO TABS: 1.0000 | ORAL_TABLET | ORAL | 0 refills | Status: DC | PRN

## 2016-10-22 NOTE — Telephone Encounter (Signed)
Printed and placed up front--pt is aware.

## 2016-10-22 NOTE — Telephone Encounter (Signed)
FYI: pt left a message at Dr. [name] office this morning. Waiting to here bak from office. Pt is feeling worse then he did last week. He would like to know if there is any other pain medication he can take until he can be seen?

## 2016-10-22 NOTE — Telephone Encounter (Signed)
We could try some Vicodin 5/325 mg 1-2 po q 4-6 hours prn pain #30 with no refills.  Referral has already been placed for neurosurgeon.

## 2016-10-22 NOTE — Telephone Encounter (Signed)
Pt is in a lot of pain and would like to see if Dr. [name] or Dr. Saintclair Halsted can see him anytime.

## 2016-10-23 DIAGNOSIS — M5126 Other intervertebral disc displacement, lumbar region: Secondary | ICD-10-CM | POA: Diagnosis not present

## 2016-10-23 DIAGNOSIS — M5416 Radiculopathy, lumbar region: Secondary | ICD-10-CM | POA: Diagnosis not present

## 2016-10-24 ENCOUNTER — Other Ambulatory Visit: Payer: Self-pay | Admitting: Family Medicine

## 2016-10-29 ENCOUNTER — Encounter: Payer: Self-pay | Admitting: Physical Medicine and Rehabilitation

## 2016-10-29 DIAGNOSIS — M5116 Intervertebral disc disorders with radiculopathy, lumbar region: Secondary | ICD-10-CM | POA: Diagnosis not present

## 2016-10-29 DIAGNOSIS — M5126 Other intervertebral disc displacement, lumbar region: Secondary | ICD-10-CM | POA: Diagnosis not present

## 2016-11-15 ENCOUNTER — Other Ambulatory Visit: Payer: Self-pay | Admitting: Neurosurgery

## 2016-11-15 DIAGNOSIS — R29898 Other symptoms and signs involving the musculoskeletal system: Secondary | ICD-10-CM

## 2016-11-18 ENCOUNTER — Ambulatory Visit
Admission: RE | Admit: 2016-11-18 | Discharge: 2016-11-18 | Disposition: A | Discharge status: Home / Self Care | Payer: Medicare Other | Source: Ambulatory Visit | Attending: Neurosurgery | Admitting: Neurosurgery

## 2016-11-18 ENCOUNTER — Other Ambulatory Visit: Payer: Medicare Other

## 2016-11-18 DIAGNOSIS — M48061 Spinal stenosis, lumbar region without neurogenic claudication: Secondary | ICD-10-CM | POA: Diagnosis not present

## 2016-11-18 DIAGNOSIS — R29898 Other symptoms and signs involving the musculoskeletal system: Secondary | ICD-10-CM

## 2016-12-18 ENCOUNTER — Ambulatory Visit: Payer: Medicare Other | Admitting: Sports Medicine

## 2016-12-18 ENCOUNTER — Encounter: Payer: Self-pay | Admitting: Sports Medicine

## 2016-12-18 ENCOUNTER — Ambulatory Visit (INDEPENDENT_AMBULATORY_CARE_PROVIDER_SITE_OTHER): Payer: Medicare Other | Admitting: Sports Medicine

## 2016-12-18 DIAGNOSIS — M5136 Other intervertebral disc degeneration, lumbar region: Secondary | ICD-10-CM

## 2016-12-18 DIAGNOSIS — M25561 Pain in right knee: Secondary | ICD-10-CM

## 2016-12-18 DIAGNOSIS — M25562 Pain in left knee: Secondary | ICD-10-CM

## 2016-12-18 DIAGNOSIS — G8929 Other chronic pain: Secondary | ICD-10-CM

## 2016-12-18 NOTE — Assessment & Plan Note (Signed)
This is changing his gait since his last surgery has left him with weakness in left leg  Work on strength  Consider orthotic if foot drop worsens

## 2016-12-18 NOTE — Progress Notes (Signed)
[facility] Progress Note  Subjective:  Micheal [name] is a 71 y.o. male with history of L TKR in 2009, R TKR in July 2017, and recent microdisc surgery for ruptured L4-L5 disc in February 2018 who presents with concern for L foot drop and worsening L knee pain. He also complains of ongoing knee swelling of R knee. Of note, patient has had 2 arthroscopic revisions left knee within the last 2 years for scar tissue. He also had laminectomy at L3-4 in 2003.   Patient describes left knee pain as worst when L knee "locks up." This started just before his spinal surgery but has been worse since. He noticed he does not pick his left foot up as much since the spinal surgery, and his wife has noted that he turns his left foot outwards. His R knee is hot and swollen.   He had physical therapy for his right knee last summer but no physical therapy was recommended after his back surgery.  Pst Hx -- prostate cancer  ROS: History of peripheral neuropathy that no longer is painful but does have some decreased sensation down left leg occasionally. No bladder or bowel incontinence.  No Known Allergies  Objective: Blood pressure 122/83, pulse 80, height 5\' 10"  (1.778 m), weight 200 lb (90.7 kg). Body mass index is 28.7 kg/m. Constitutional: Pleasant, overweight male in NAD Pulmonary/Chest: No respiratory distress.  Musculoskeletal: Knees with 140 of flexion bilaterally and full extension. Right knee with a negative 3-5 angle of alignment when extended. Palpable warmth of bilateral knees right greater than left. Quad and hamstring strength intact bilaterally. TTP over left lateral tibial tubercle. Could not elicit left knee locking with flexion. Plantar and dorsi flexion strength decreased on left at 4/5 compared to 5 out of 5 on the right. Eversion and inversion intact. Regarding gait, patient could not heel-walk on the left and it was more difficult for him to walk on his tiptoes on the left  as well. Hip abduction strength decreased on left compared to right.   Inability to do 1 leg step up on 8 inch block on left Inability to do good 1 leg balance on left  Skin: Varicose veins present  Psychiatric: Normal mood and affect.  Vitals reviewed  Assessment/Plan: Knee pain, bilateral - Chronic. Right knee pain likely secondary to both continued inflammation after TKR last July and compensation due to left lower extremity weakness. Given deficits of plantar and dorsiflexion on the left suspect ongoing peroneal neuropathy. Continued need for revisions due to scar tissue of left knee may be secondary to chronic varus/valgus stretch. - Provided exercises to strengthen left hip abductors. - Recommended compression sleeve for right knee to help reduce inflammation. - To prevent falls would fit for custom orthotic if patient finds he is stumbling  Follow-up in about 1 month to see if exercise has improved left hip abduction strength and improved gait.  Olene Floss, MD [place], PGY-2  I observed and examined the patient with the resident and agree with assessment and plan.  Note reviewed and modified by me. Stefanie Libel, MD

## 2016-12-18 NOTE — Assessment & Plan Note (Signed)
-   Chronic. Right knee pain likely secondary to both continued inflammation after TKR last July and compensation due to left lower extremity weakness. Given deficits of plantar and dorsiflexion on the left suspect ongoing peroneal neuropathy. Continued need for revisions due to scar tissue of left knee may be secondary to chronic varus/valgus stretch. - Provided exercises to strengthen left hip abductors. - Recommended compression sleeve for right knee to help reduce inflammation. - To prevent falls would fit for custom orthotic if patient finds he is stumbling

## 2016-12-19 ENCOUNTER — Encounter: Payer: Self-pay | Admitting: Family Medicine

## 2016-12-19 ENCOUNTER — Ambulatory Visit (INDEPENDENT_AMBULATORY_CARE_PROVIDER_SITE_OTHER): Payer: Medicare Other | Admitting: Family Medicine

## 2016-12-19 VITALS — BP 110/80 | HR 83 | Temp 98.5°F | Wt 197.8 lb

## 2016-12-19 DIAGNOSIS — Z8546 Personal history of malignant neoplasm of prostate: Secondary | ICD-10-CM | POA: Diagnosis not present

## 2016-12-19 LAB — PSA

## 2016-12-19 NOTE — Progress Notes (Signed)
Pre visit review using our clinic review tool, if applicable. No additional management support is needed unless otherwise documented below in the visit note. 

## 2016-12-19 NOTE — Progress Notes (Signed)
Subjective:     Patient ID: Micheal [name], male   DOB: Nov 12, 1945, 71 y.o.   MRN: [id number]  HPI   Patient seen requesting PSA testing. He's had remote history of prostate cancer and had TURP several years ago. No recent dysuria. PSAs following surgery have been consistently around 0. Had some recent issues with left L4 nerve root compression from disc and had surgery and has done better since then. He is back playing tennis and exercising regularly  Past Medical History:  Diagnosis Date  . Anxiety   . Benign essential tremor    hands  . Complication of anesthesia 1999   spinal with last knee replacement  . Diverticulitis    last Saturday 03/24/16 -tx with antibiotics  . GERD (gastroesophageal reflux disease)   . H/O balanitis    recurrent intermittant-- uses diprolene cream prn  . History of colon polyps   . History of kidney stones   . History of prostate cancer urologist-  dr Alinda Money-  currently PSA nondetectable 10/ 2016   s/p  radial prostatectomy w/ nerve sparing 07-17-2007/   stage T1c,  Gleason 3+3=6,  PSA 6.33  . Hyperlipidemia   . Hypertension   . Mild obstructive sleep apnea    uses mouth guard only  . Nocturia   . Osteoarthritis    knees  . Sigmoid diverticulosis   . Synovial hypertrophy of left knee   . Urge urinary incontinence    Past Surgical History:  Procedure Laterality Date  . BACK SURGERY     L4 surgery  . CARDIOVASCULAR STRESS TEST  08-04-2014   normal perfusion nuclear study/  normal LV function and wall motion , ef 54%  . COLONOSCOPY W/ POLYPECTOMY  06-21-2014  . JOINT REPLACEMENT    . KNEE ARTHROSCOPY Bilateral right 07-31-2009//  left   . KNEE ARTHROSCOPY Left 12/08/2014   Procedure: ARTHROSCOPY LEFT KNEE WITH SYNOVECTOMY;  Surgeon: Gaynelle Arabian, MD;  Location: [facility];  Service: Orthopedics;  Laterality: Left;  . KNEE ARTHROSCOPY Left 02/14/2016   Procedure: ARTHROSCOPY KNEE WTH SYNOVECTOMY;  Surgeon: Gaynelle Arabian, MD;   Location[facility];  Service: Orthopedics;  Laterality: Left;  . LUMBAR DISC SURGERY  04-13-2002   left  L4 -- L5  . ROBOT ASSISTED LAPAROSCOPIC RADICAL PROSTATECTOMY  07-17-2007  . TONSILLECTOMY  as child  . TOTAL KNEE ARTHROPLASTY Left 03-08-2008  . TOTAL KNEE ARTHROPLASTY Right 04/02/2016   Procedure: RIGHT TOTAL KNEE ARTHROPLASTY;  Surgeon: Gaynelle Arabian, MD;  Location: [facility];  Service: Orthopedics;  Laterality: Right;    reports that he has never smoked. He has never used smokeless tobacco. He reports that he drinks alcohol. He reports that he does not use drugs. family history includes Aneurysm in his father; Sudden death in his mother. No Known Allergies   Review of Systems  Constitutional: Negative for fatigue.  Eyes: Negative for visual disturbance.  Respiratory: Negative for cough, chest tightness and shortness of breath.   Cardiovascular: Negative for chest pain, palpitations and leg swelling.  Neurological: Negative for dizziness, syncope, weakness, light-headedness and headaches.       Objective:   Physical Exam  Constitutional: He is oriented to person, place, and time. He appears well-developed and well-nourished.  HENT:  Right Ear: External ear normal.  Left Ear: External ear normal.  Mouth/Throat: Oropharynx is clear and moist.  Eyes: Pupils are equal, round, and reactive to light.  Neck: Neck supple. No thyromegaly present.  Cardiovascular: Normal  rate and regular rhythm.   Pulmonary/Chest: Effort normal and breath sounds normal. No respiratory distress. He has no wheezes. He has no rales.  Musculoskeletal: He exhibits no edema.  Neurological: He is alert and oriented to person, place, and time.       Assessment:     History of adenocarcinoma the prostate-s/p TURP.  Patient requesting repeat PSA    Plan:     -Recheck PSA -Patient reminded to get yearly Medicare wellness exam and medical follow-up within about 6 months  [name] MD [facility]

## 2017-01-02 DIAGNOSIS — H5203 Hypermetropia, bilateral: Secondary | ICD-10-CM | POA: Diagnosis not present

## 2017-01-02 DIAGNOSIS — H2513 Age-related nuclear cataract, bilateral: Secondary | ICD-10-CM | POA: Diagnosis not present

## 2017-03-27 ENCOUNTER — Other Ambulatory Visit: Payer: Self-pay | Admitting: Dermatology

## 2017-03-27 DIAGNOSIS — L570 Actinic keratosis: Secondary | ICD-10-CM | POA: Diagnosis not present

## 2017-03-27 DIAGNOSIS — D229 Melanocytic nevi, unspecified: Secondary | ICD-10-CM | POA: Diagnosis not present

## 2017-03-27 DIAGNOSIS — L820 Inflamed seborrheic keratosis: Secondary | ICD-10-CM | POA: Diagnosis not present

## 2017-03-27 DIAGNOSIS — D492 Neoplasm of unspecified behavior of bone, soft tissue, and skin: Secondary | ICD-10-CM | POA: Diagnosis not present

## 2017-03-27 DIAGNOSIS — L821 Other seborrheic keratosis: Secondary | ICD-10-CM | POA: Diagnosis not present

## 2017-06-06 ENCOUNTER — Encounter: Payer: Self-pay | Admitting: Family Medicine

## 2017-06-10 DIAGNOSIS — Z23 Encounter for immunization: Secondary | ICD-10-CM | POA: Diagnosis not present

## 2017-06-24 IMAGING — CR DG ABDOMEN ACUTE W/ 1V CHEST
4 series · 4 of 4 positions shown · non-contrast
Comparison: Chest radiograph July 30, 2014; abdominal
radiograph October 14, 2013

CLINICAL DATA: Abdominal pain for 2 days and constipation

EXAM:
DG ABDOMEN ACUTE W/ 1V CHEST

[w chest pa]
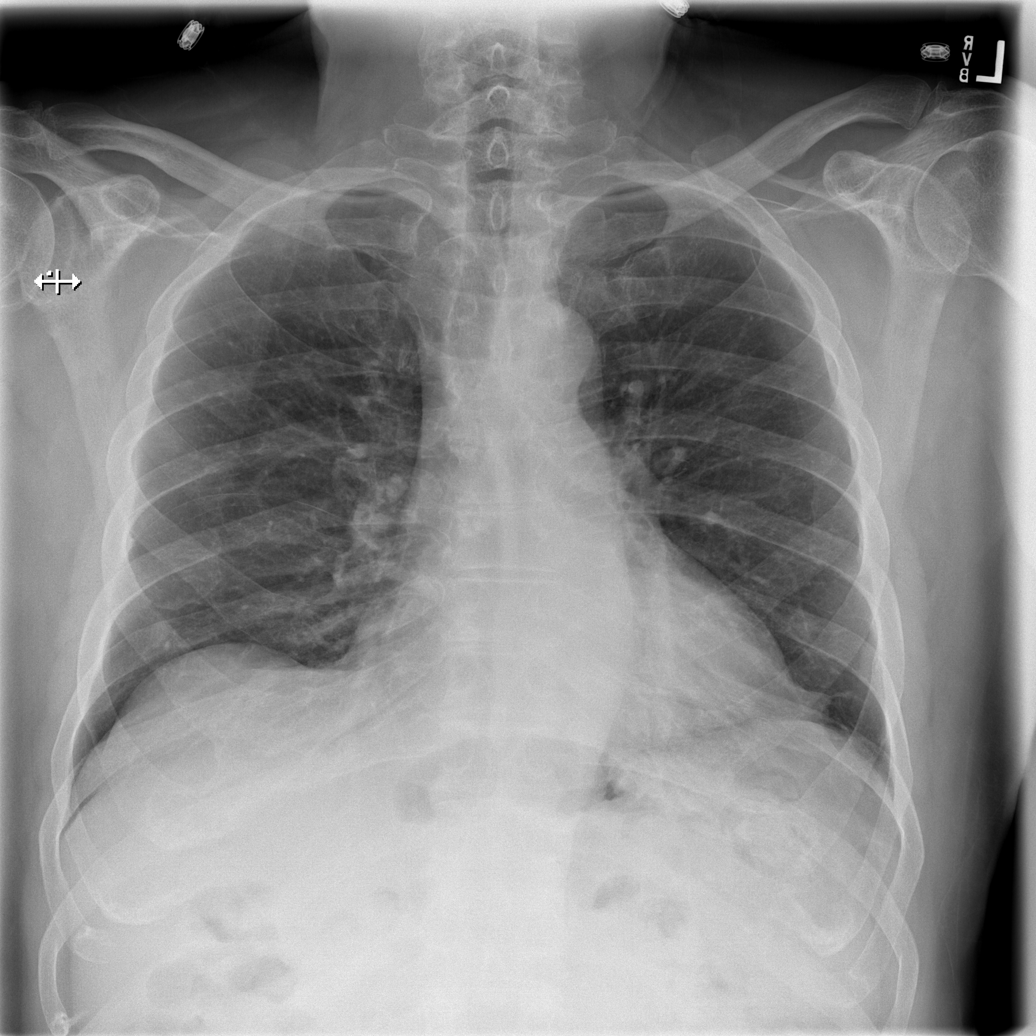

[w abdomen upright]
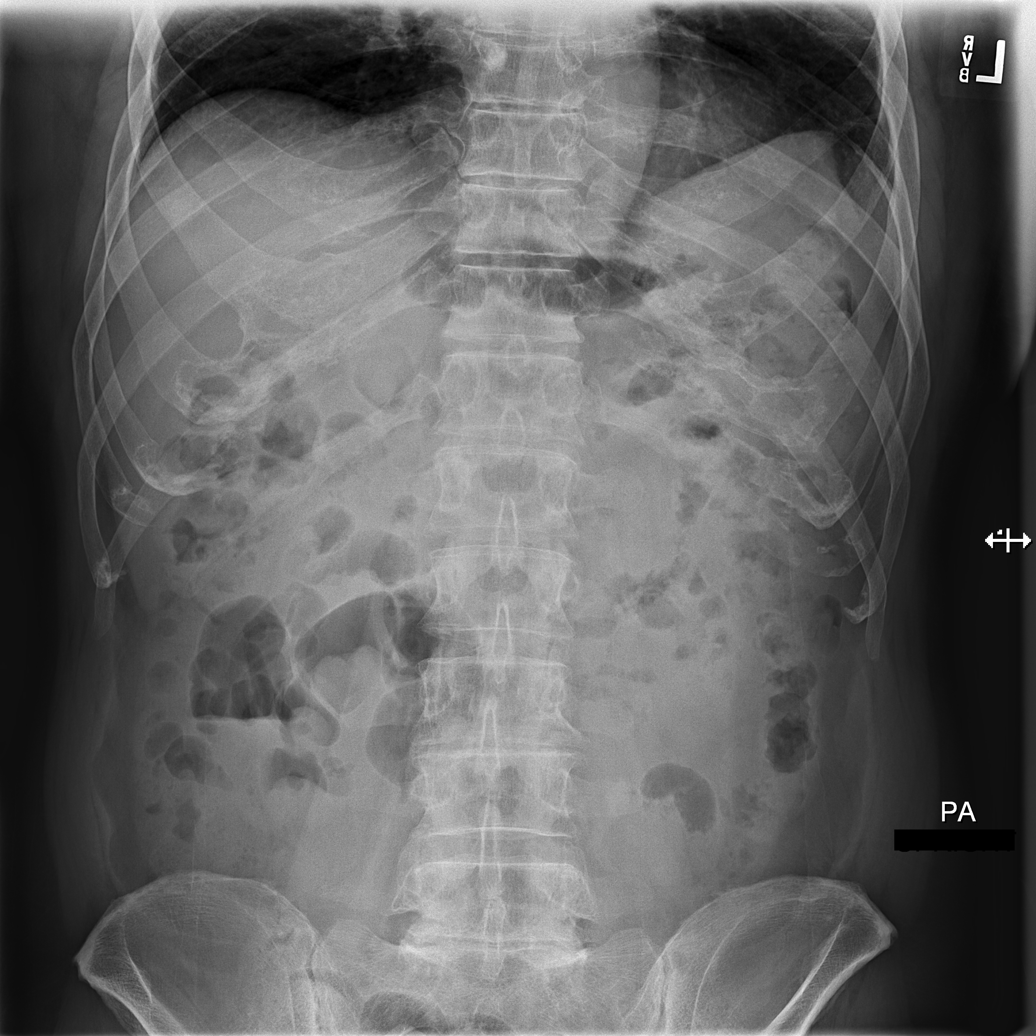

[t abdomen supine (1 of 2)]
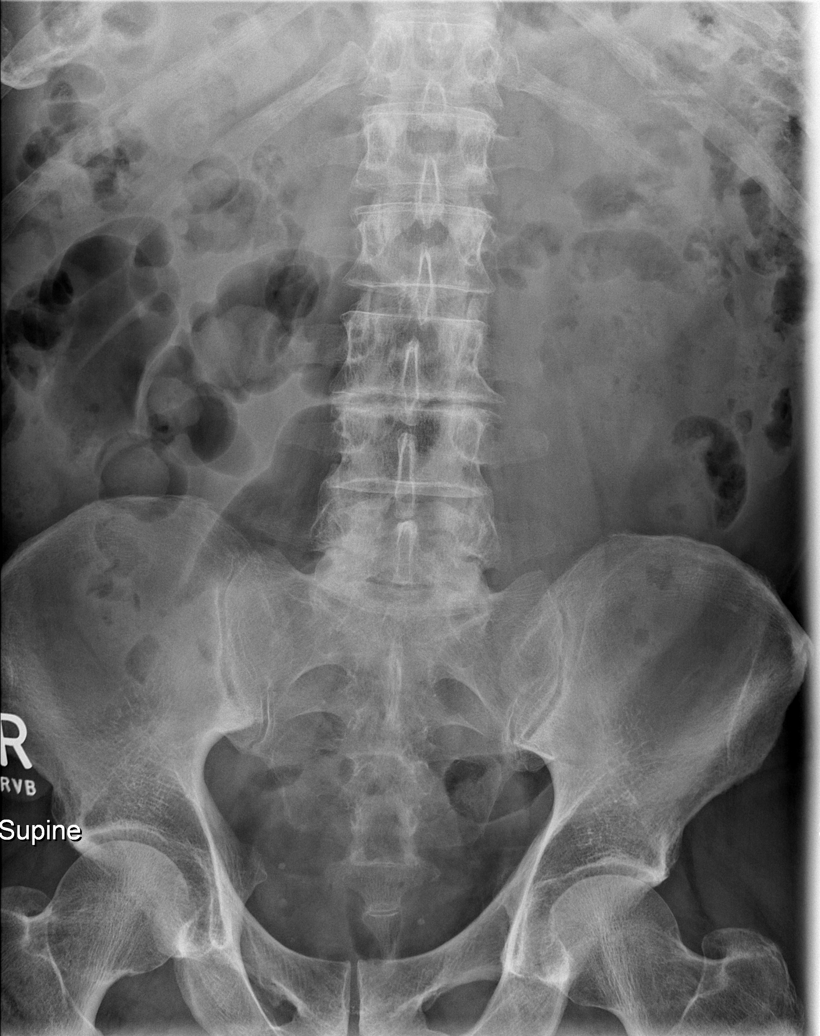

[t abdomen supine (2 of 2)]
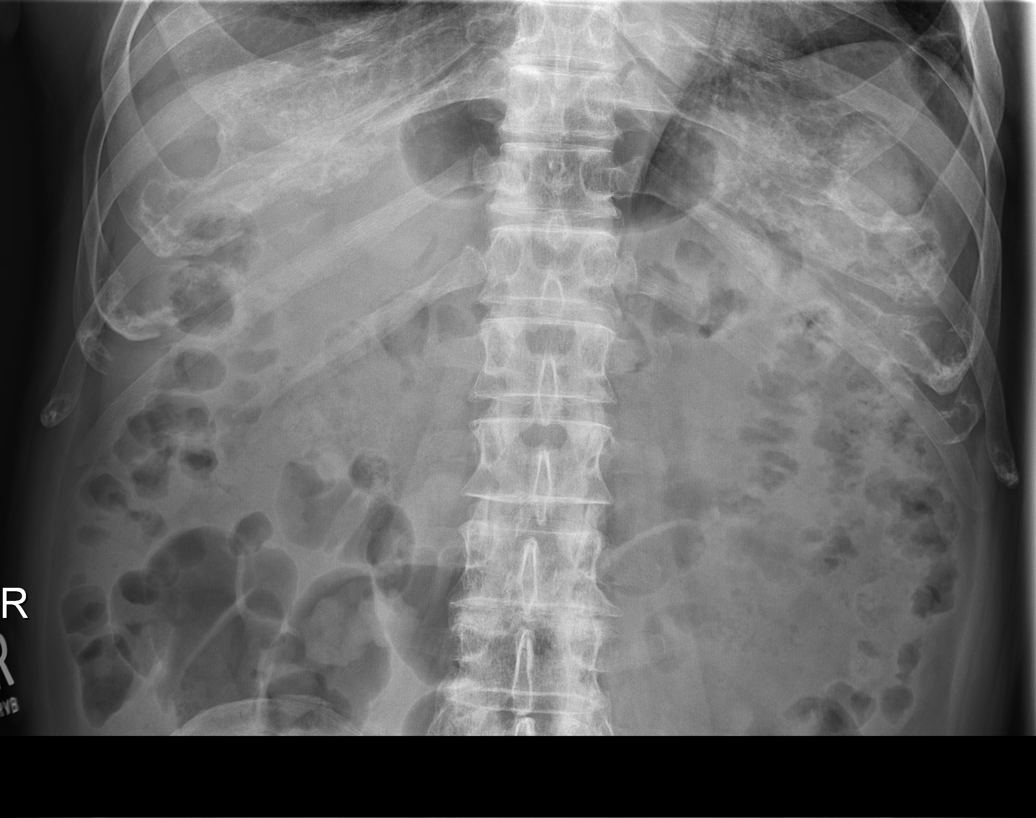

[4 of 4 positions shown; findings below may reference images not displayed]

FINDINGS: PA chest: There is slight atelectasis in the left base. No edema or
consolidation. Heart size and pulmonary vascularity are normal. No
adenopathy.

Supine and upright abdomen: There is moderate stool throughout the
colon. There is no appreciable bowel dilatation. There are scattered
air-fluid levels in the right abdomen. No free air evident. There
are small phleboliths in the pelvis.
IMPRESSION: Scattered air-fluid levels. Question early ileus or enteritis.
Obstruction is felt to be less likely. No free air. Moderate stool
in the colon. Mild left lung base atelectasis. Lungs elsewhere
clear.

## 2017-07-23 NOTE — Progress Notes (Addendum)
Subjective:   Micheal [name] is a 71 y.o. male who presents for Medicare Annual/Subsequent preventive examination.  The Patient was informed that the wellness visit is to identify future health risk and educate and initiate measures that can reduce risk for increased disease through the lifespan.    Annual Wellness Assessment  Reports health as a few issues Has back pain; around shoulder blade and down his right arm Topical analgesic helps 1- 10 pain; worse case 5 or 6   Took a tramadol    Also states Hands are stiff in the am -occurred over the last several months   The more he is up; the worse it gets Back surgery in jan; disc in L4-5  Dr. [name]; will make apt with Dr. [name]    Preventive Screening -Counseling & Management  Medicare Annual Preventive Care Visit - Subsequent Last OV 12/2016  2 dtr  3 granddtrs;  One lives in [place] and [place]  6 br and 4 baths;  2 bedroom and 2 br on the main floor  Not planning to scale down in the immediate future   Colonoscopy 06/2014 due in 10 years Cautious around stairs    PSA checked 12/19/2016 Hx of prostate cancer Dr. Alinda Money; radical Prostatectomy 07/17/2007  Tobacco neg but occasionally cigars when fishing many years ago   Health Maintenance Due  Topic Date Due  . Hepatitis C Screening  11/13/1945   May have at his next blood draw  VS reviewed;  BP mod up but states it is usually down 120/80 range Having issue with back this am   Diet  Health diet Do not eat a lot between meals Likes carbs; bread  Sandwich at lunch  Dinner; salads and green vegetables Wife is DM   BMI 29  Exercise Loves to fish;  Walks a lot; fish 5 days a week 2 to 5 miles per day Walks the dog   Exercise 04/02/2017 OA right knee  Balance is not good but mostly due to knees  Stresses; none    Cardiac Risk Factors Addressed Hyperlipidemia- chol/hdl 4; chol 120; hdl 35; ldl 63 and trig 147 Diabetes- glucose 155  Obesity  overweight; states weight fluctuates but overall eat healthy;   Advanced Directives completed   Patient Care Team: [name], MD as PCP - General Cardiac Risk Factors include: advanced age (>69men, >49 women);family history of premature cardiovascular disease;hypertension;male gender     Objective:    Vitals: BP 140/80   Pulse 64   Ht 5\' 10"  (1.778 m)   Wt 202 lb (91.6 kg)   SpO2 97%   BMI 28.98 kg/m   Body mass index is 28.98 kg/m.  Tobacco Social History   Tobacco Use  Smoking Status Never Smoker  Smokeless Tobacco Never Used     Counseling given: Yes   Past Medical History:  Diagnosis Date  . Anxiety   . Benign essential tremor    hands  . Complication of anesthesia 1999   spinal with last knee replacement  . Diverticulitis    last Saturday 03/24/16 -tx with antibiotics  . GERD (gastroesophageal reflux disease)   . H/O balanitis    recurrent intermittant-- uses diprolene cream prn  . History of colon polyps   . History of kidney stones   . History of prostate cancer urologist-  dr Alinda Money-  currently PSA nondetectable 10/ 2016   s/p  radial prostatectomy w/ nerve sparing 07-17-2007/   stage T1c,  Gleason  3+3=6,  PSA 6.33  . Hyperlipidemia   . Hypertension   . Mild obstructive sleep apnea    uses mouth guard only  . Nocturia   . Osteoarthritis    knees  . Sigmoid diverticulosis   . Synovial hypertrophy of left knee   . Urge urinary incontinence    Past Surgical History:  Procedure Laterality Date  . BACK SURGERY     L4 surgery  . CARDIOVASCULAR STRESS TEST  08-04-2014   normal perfusion nuclear study/  normal LV function and wall motion , ef 54%  . COLONOSCOPY W/ POLYPECTOMY  06-21-2014  . JOINT REPLACEMENT    . KNEE ARTHROSCOPY Bilateral right 07-31-2009//  left   . LUMBAR DISC SURGERY  04-13-2002   left  L4 -- L5  . ROBOT ASSISTED LAPAROSCOPIC RADICAL PROSTATECTOMY  07-17-2007  . TONSILLECTOMY  as child  . TOTAL KNEE ARTHROPLASTY Left  03-08-2008   Family History  Problem Relation Age of Onset  . Sudden death Mother   . Aneurysm Father   . Heart attack Unknown        grandfather   Social History   Substance and Sexual Activity  Sexual Activity Not on file    Outpatient Encounter Medications as of 07/24/2017  Medication Sig  . acetaminophen (TYLENOL) 500 MG tablet Take 1,000 mg by mouth every 6 (six) hours as needed for mild pain.   Marland Kitchen ALPRAZolam (XANAX) 0.5 MG tablet Take 1 tablet (0.5 mg total) by mouth every 8 (eight) hours as needed for anxiety. 1/2 to 1 tab by mouth as needed for anxiety  . atorvastatin (LIPITOR) 20 MG tablet TAKE 1 TABLET BY MOUTH  DAILY AT 6 PM.  . olmesartan (BENICAR) 20 MG tablet TAKE 1 TABLET BY MOUTH  DAILY  . polyethylene glycol (MIRALAX / GLYCOLAX) packet Take 17 g by mouth 2 (two) times daily.  . sertraline (ZOLOFT) 50 MG tablet TAKE 1 TABLET BY MOUTH  DAILY  . cyclobenzaprine (FLEXERIL) 5 MG tablet Take 5 mg by mouth 2 (two) times daily as needed for muscle spasms.  Marland Kitchen HYDROcodone-acetaminophen (NORCO/VICODIN) 5-325 MG tablet Take 1-2 tablets by mouth every 4 (four) hours as needed for moderate pain. (Patient not taking: Reported on 07/24/2017)  . methocarbamol (ROBAXIN) 500 MG tablet Take 1 tablet (500 mg total) by mouth every 6 (six) hours as needed for muscle spasms. (Patient not taking: Reported on 07/24/2017)  . oxyCODONE (OXY IR/ROXICODONE) 5 MG immediate release tablet Take 1-2 tablets (5-10 mg total) by mouth every 3 (three) hours as needed for moderate pain or severe pain. (Patient not taking: Reported on 07/24/2017)   No facility-administered encounter medications on file as of 07/24/2017.     Activities of Daily Living In your present state of health, do you have any difficulty performing the following activities: 07/24/2017  Hearing? N  Vision? N  Difficulty concentrating or making decisions? N  Comment no issues   Walking or climbing stairs? Y  Dressing or bathing? N  Doing  errands, shopping? N  Preparing Food and eating ? N  Using the Toilet? N  In the past six months, have you accidently leaked urine? N  Do you have problems with loss of bowel control? N  Managing your Medications? N  Managing your Finances? N  Housekeeping or managing your Housekeeping? N  Some recent data might be hidden    Patient Care Team: [name], MD as PCP - General   Assessment:  Exercise Activities and Dietary recommendations Current Exercise Habits: Home exercise routine, Type of exercise: walking, Time (Minutes): 60, Frequency (Times/Week): 5, Weekly Exercise (Minutes/Week): 300, Intensity: Mild  Goals    None     Fall Risk Fall Risk  07/24/2017 12/19/2016 12/18/2016 10/31/2015 10/31/2015  Falls in the past year? No No No No No  Number falls in past yr: - - - - -  Injury with Fall? - - - - -   Depression Screen PHQ 2/9 Scores 07/24/2017 12/19/2016 12/18/2016 10/31/2015  PHQ - 2 Score 0 0 0 0    Cognitive Function   Ad8 score reviewed for issues:  Issues making decisions:  Less interest in hobbies / activities:  Repeats questions, stories (family complaining):  Trouble using ordinary gadgets (microwave, computer, phone):  Forgets the month or year:   Mismanaging finances:   Remembering appts:  Daily problems with thinking and/or memory: Ad8 score is=0      Immunization History  Administered Date(s) Administered  . Influenza, High Dose Seasonal PF 06/22/2014  . Influenza-Unspecified 07/19/2015, 06/20/2016, 06/10/2017  . Pneumococcal Conjugate-13 10/26/2013  . Pneumococcal Polysaccharide-23 02/16/2007, 10/31/2015  . Td 10/18/2004  . Tdap 12/25/2013  . Zoster 02/16/2007   Screening Tests Health Maintenance  Topic Date Due  . Hepatitis C Screening  [birth year]  . TETANUS/TDAP  12/26/2023  . COLONOSCOPY  06/21/2024  . INFLUENZA VACCINE  Completed  . PNA vac Low Risk Adult  Completed      Plan:     PCP Notes  Health Maintenance l  Educated regarding hep c  Educated regarding shingrix  May discuss AAA check due to father having AAA;  He was not a smoker;  The patient smoked cigars occasionally when fishing  The patient will discuss with you at the next visit   Abnormal Screens  none  Referrals   Patient concerns; 1.States he has pain under his right shoulder, radiating to his arm. Denies pain of cardiac origin. States his wife gave him a pain patch which helped.  Plans to fup with Dr. [name]    2. Also c/o of stiffness in fingers noted over the last several months. Noted in distal fingers; tendons in upper palm; both hands involved. No swelling noted, but does have mild hand tremor and was seen by Neuro and dx with essential tremors. Gets better during the day but not fully resolved some days. Does not interfere fishing.  Advised to see Dr. Elease Hashimoto to assess and treat as necessary. Will fup on back issues first   Nurse Concerns; As noted   Next PCP apt tBS      I have personally reviewed and noted the following in the patient's chart:   . Medical and social history . Use of alcohol, tobacco or illicit drugs  . Current medications and supplements . Functional ability and status . Nutritional status . Physical activity . Advanced directives . List of other physicians . Hospitalizations, surgeries, and ER visits in previous 12 months . Vitals . Screenings to include cognitive, depression, and falls . Referrals and appointments  In addition, I have reviewed and discussed with patient certain preventive protocols, quality metrics, and best practice recommendations. A written personalized care plan for preventive services as well as general preventive health recommendations were provided to patient.     WUJWJ,XBJYN, RN  07/24/2017  Agree with assessment as above.  [name] MD [facility]

## 2017-07-24 ENCOUNTER — Ambulatory Visit (INDEPENDENT_AMBULATORY_CARE_PROVIDER_SITE_OTHER): Payer: Medicare Other

## 2017-07-24 VITALS — BP 140/80 | HR 64 | Ht 70.0 in | Wt 202.0 lb

## 2017-07-24 DIAGNOSIS — Z1159 Encounter for screening for other viral diseases: Secondary | ICD-10-CM

## 2017-07-24 DIAGNOSIS — Z0000 Encounter for general adult medical examination without abnormal findings: Secondary | ICD-10-CM

## 2017-07-24 NOTE — Patient Instructions (Addendum)
Micheal [name] , Thank you for taking time to come for your Medicare Wellness Visit. I appreciate your ongoing commitment to your health goals. Please review the following plan we discussed and let me know if I can assist you in the future.   Medicare now request all "baby boomers" test for possible exposure to Hepatitis C. Many may have been exposed due to dental work, tatoo's, vaccinations when young. The Hepatitis C virus is dormant for many years and then sometimes will cause liver cancer. If you gave blood in the past 15 years, you were most likely checked for Hep C. If you rec'd blood; you may want to consider testing or if you are high risk for any other reason.   Shingrix is a vaccine for the prevention of Shingles in Adults 50 and older.  If you are on Medicare, you can request a prescription from your doctor to be filled at a pharmacy.  Please check with your benefits regarding applicable copays or out of pocket expenses.  The Shingrix is given in 2 vaccines approx 8 weeks apart. You must receive the 2nd dose prior to 6 months from receipt of the first.   May want AAA screen due to family hx   These are the goals we discussed: Continue to fish and enjoy retirement  Goals    None      This is a list of the screening recommended for you and due dates:  Health Maintenance  Topic Date Due  .  Hepatitis C: One time screening is recommended by Center for Disease Control  (CDC) for  adults born from [birth year] through 1964.   December 22, 1945  . Tetanus Vaccine  12/26/2023  . Colon Cancer Screening  06/21/2024  . Flu Shot  Completed  . Pneumonia vaccines  Completed      Fall Prevention in the Home Falls can cause injuries. They can happen to people of all ages. There are many things you can do to make your home safe and to help prevent falls. What can I do on the outside of my home?  Regularly fix the edges of walkways and driveways and fix any cracks.  Remove anything that might make you  trip as you walk through a door, such as a raised step or threshold.  Trim any bushes or trees on the path to your home.  Use bright outdoor lighting.  Clear any walking paths of anything that might make someone trip, such as rocks or tools.  Regularly check to see if handrails are loose or broken. Make sure that both sides of any steps have handrails.  Any raised decks and porches should have guardrails on the edges.  Have any leaves, snow, or ice cleared regularly.  Use sand or salt on walking paths during winter.  Clean up any spills in your garage right away. This includes oil or grease spills. What can I do in the bathroom?  Use night lights.  Install grab bars by the toilet and in the tub and shower. Do not use towel bars as grab bars.  Use non-skid mats or decals in the tub or shower.  If you need to sit down in the shower, use a plastic, non-slip stool.  Keep the floor dry. Clean up any water that spills on the floor as soon as it happens.  Remove soap buildup in the tub or shower regularly.  Attach bath mats securely with double-sided non-slip rug tape.  Do not have throw rugs and other  things on the floor that can make you trip. What can I do in the bedroom?  Use night lights.  Make sure that you have a light by your bed that is easy to reach.  Do not use any sheets or blankets that are too big for your bed. They should not hang down onto the floor.  Have a firm chair that has side arms. You can use this for support while you get dressed.  Do not have throw rugs and other things on the floor that can make you trip. What can I do in the kitchen?  Clean up any spills right away.  Avoid walking on wet floors.  Keep items that you use a lot in easy-to-reach places.  If you need to reach something above you, use a strong step stool that has a grab bar.  Keep electrical cords out of the way.  Do not use floor polish or wax that makes floors slippery. If  you must use wax, use non-skid floor wax.  Do not have throw rugs and other things on the floor that can make you trip. What can I do with my stairs?  Do not leave any items on the stairs.  Make sure that there are handrails on both sides of the stairs and use them. Fix handrails that are broken or loose. Make sure that handrails are as long as the stairways.  Check any carpeting to make sure that it is firmly attached to the stairs. Fix any carpet that is loose or worn.  Avoid having throw rugs at the top or bottom of the stairs. If you do have throw rugs, attach them to the floor with carpet tape.  Make sure that you have a light switch at the top of the stairs and the bottom of the stairs. If you do not have them, ask someone to add them for you. What else can I do to help prevent falls?  Wear shoes that: ? Do not have high heels. ? Have rubber bottoms. ? Are comfortable and fit you well. ? Are closed at the toe. Do not wear sandals.  If you use a stepladder: ? Make sure that it is fully opened. Do not climb a closed stepladder. ? Make sure that both sides of the stepladder are locked into place. ? Ask someone to hold it for you, if possible.  Clearly mark and make sure that you can see: ? Any grab bars or handrails. ? First and last steps. ? Where the edge of each step is.  Use tools that help you move around (mobility aids) if they are needed. These include: ? Canes. ? Walkers. ? Scooters. ? Crutches.  Turn on the lights when you go into a dark area. Replace any light bulbs as soon as they burn out.  Set up your furniture so you have a clear path. Avoid moving your furniture around.  If any of your floors are uneven, fix them.  If there are any pets around you, be aware of where they are.  Review your medicines with your doctor. Some medicines can make you feel dizzy. This can increase your chance of falling. Ask your doctor what other things that you can do to  help prevent falls. This information is not intended to replace advice given to you by your health care provider. Make sure you discuss any questions you have with your health care provider. Document Released: 06/30/2009 Document Revised: 02/09/2016 Document Reviewed: 10/08/2014 Elsevier Interactive Patient  Education  2018 [place] Maintenance, Male A healthy lifestyle and preventive care is important for your health and wellness. Ask your health care provider about what schedule of regular examinations is right for you. What should I know about weight and diet? Eat a Healthy Diet  Eat plenty of vegetables, fruits, whole grains, low-fat dairy products, and lean protein.  Do not eat a lot of foods high in solid fats, added sugars, or salt.  Maintain a Healthy Weight Regular exercise can help you achieve or maintain a healthy weight. You should:  Do at least 150 minutes of exercise each week. The exercise should increase your heart rate and make you sweat (moderate-intensity exercise).  Do strength-training exercises at least twice a week.  Watch Your Levels of Cholesterol and Blood Lipids  Have your blood tested for lipids and cholesterol every 5 years starting at 70 years of age. If you are at high risk for heart disease, you should start having your blood tested when you are 70 years old. You may need to have your cholesterol levels checked more often if: ? Your lipid or cholesterol levels are high. ? You are older than 70 years of age. ? You are at high risk for heart disease.  What should I know about cancer screening? Many types of cancers can be detected early and may often be prevented. Lung Cancer  You should be screened every year for lung cancer if: ? You are a current smoker who has smoked for at least 30 years. ? You are a former smoker who has quit within the past 15 years.  Talk to your health care provider about your screening options, when you should  start screening, and how often you should be screened.  Colorectal Cancer  Routine colorectal cancer screening usually begins at 70 years of age and should be repeated every 5-10 years until you are 70 years old. You may need to be screened more often if early forms of precancerous polyps or small growths are found. Your health care provider may recommend screening at an earlier age if you have risk factors for colon cancer.  Your health care provider may recommend using home test kits to check for hidden blood in the stool.  A small camera at the end of a tube can be used to examine your colon (sigmoidoscopy or colonoscopy). This checks for the earliest forms of colorectal cancer.  Prostate and Testicular Cancer  Depending on your age and overall health, your health care provider may do certain tests to screen for prostate and testicular cancer.  Talk to your health care provider about any symptoms or concerns you have about testicular or prostate cancer.  Skin Cancer  Check your skin from head to toe regularly.  Tell your health care provider about any new moles or changes in moles, especially if: ? There is a change in a mole's size, shape, or color. ? You have a mole that is larger than a pencil eraser.  Always use sunscreen. Apply sunscreen liberally and repeat throughout the day.  Protect yourself by wearing long sleeves, pants, a wide-brimmed hat, and sunglasses when outside.  What should I know about heart disease, diabetes, and high blood pressure?  If you are 14-4 years of age, have your blood pressure checked every 3-5 years. If you are 92 years of age or older, have your blood pressure checked every year. You should have your blood pressure measured twice-once when you are at  a hospital or clinic, and once when you are not at a hospital or clinic. Record the average of the two measurements. To check your blood pressure when you are not at a hospital or clinic, you can  use: ? An automated blood pressure machine at a pharmacy. ? A home blood pressure monitor.  Talk to your health care provider about your target blood pressure.  If you are between 70-99 years old, ask your health care provider if you should take aspirin to prevent heart disease.  Have regular diabetes screenings by checking your fasting blood sugar level. ? If you are at a normal weight and have a low risk for diabetes, have this test once every three years after the age of 43. ? If you are overweight and have a high risk for diabetes, consider being tested at a younger age or more often.  A one-time screening for abdominal aortic aneurysm (AAA) by ultrasound is recommended for men aged 67-75 years who are current or former smokers. What should I know about preventing infection? Hepatitis B If you have a higher risk for hepatitis B, you should be screened for this virus. Talk with your health care provider to find out if you are at risk for hepatitis B infection. Hepatitis C Blood testing is recommended for:  Everyone born from [birth year] through 1964.  Anyone with known risk factors for hepatitis C.  Sexually Transmitted Diseases (STDs)  You should be screened each year for STDs including gonorrhea and chlamydia if: ? You are sexually active and are younger than 70 years of age. ? You are older than 70 years of age and your health care provider tells you that you are at risk for this type of infection. ? Your sexual activity has changed since you were last screened and you are at an increased risk for chlamydia or gonorrhea. Ask your health care provider if you are at risk.  Talk with your health care provider about whether you are at high risk of being infected with HIV. Your health care provider may recommend a prescription medicine to help prevent HIV infection.  What else can I do?  Schedule regular health, dental, and eye exams.  Stay current with your vaccines  (immunizations).  Do not use any tobacco products, such as cigarettes, chewing tobacco, and e-cigarettes. If you need help quitting, ask your health care provider.  Limit alcohol intake to no more than 2 drinks per day. One drink equals 12 ounces of beer, 5 ounces of wine, or 1 ounces of hard liquor.  Do not use street drugs.  Do not share needles.  Ask your health care provider for help if you need support or information about quitting drugs.  Tell your health care provider if you often feel depressed.  Tell your health care provider if you have ever been abused or do not feel safe at home. This information is not intended to replace advice given to you by your health care provider. Make sure you discuss any questions you have with your health care provider. Document Released: 03/01/2008 Document Revised: 05/02/2016 Document Reviewed: 06/07/2015 Elsevier Interactive Patient Education  [employer].

## 2017-08-02 DIAGNOSIS — Z6829 Body mass index (BMI) 29.0-29.9, adult: Secondary | ICD-10-CM | POA: Diagnosis not present

## 2017-08-02 DIAGNOSIS — M5412 Radiculopathy, cervical region: Secondary | ICD-10-CM | POA: Diagnosis not present

## 2017-08-02 DIAGNOSIS — I10 Essential (primary) hypertension: Secondary | ICD-10-CM | POA: Diagnosis not present

## 2017-08-06 ENCOUNTER — Other Ambulatory Visit: Payer: Self-pay | Admitting: Family Medicine

## 2017-08-15 ENCOUNTER — Other Ambulatory Visit: Payer: Self-pay | Admitting: Neurosurgery

## 2017-08-15 DIAGNOSIS — M5412 Radiculopathy, cervical region: Secondary | ICD-10-CM

## 2017-08-28 ENCOUNTER — Ambulatory Visit
Admission: RE | Admit: 2017-08-28 | Discharge: 2017-08-28 | Disposition: A | Discharge status: Home / Self Care | Payer: Medicare Other | Source: Ambulatory Visit | Attending: Neurosurgery | Admitting: Neurosurgery

## 2017-08-28 DIAGNOSIS — M5412 Radiculopathy, cervical region: Secondary | ICD-10-CM

## 2017-08-28 DIAGNOSIS — M4802 Spinal stenosis, cervical region: Secondary | ICD-10-CM | POA: Diagnosis not present

## 2017-09-05 DIAGNOSIS — M4722 Other spondylosis with radiculopathy, cervical region: Secondary | ICD-10-CM | POA: Diagnosis not present

## 2017-09-05 DIAGNOSIS — Z6829 Body mass index (BMI) 29.0-29.9, adult: Secondary | ICD-10-CM | POA: Diagnosis not present

## 2017-09-05 DIAGNOSIS — I10 Essential (primary) hypertension: Secondary | ICD-10-CM | POA: Diagnosis not present

## 2017-09-11 ENCOUNTER — Encounter: Payer: Self-pay | Admitting: Family Medicine

## 2017-09-11 ENCOUNTER — Telehealth: Payer: Self-pay | Admitting: Family Medicine

## 2017-09-11 ENCOUNTER — Ambulatory Visit (INDEPENDENT_AMBULATORY_CARE_PROVIDER_SITE_OTHER): Payer: Medicare Other | Admitting: Family Medicine

## 2017-09-11 ENCOUNTER — Ambulatory Visit (INDEPENDENT_AMBULATORY_CARE_PROVIDER_SITE_OTHER)
Admission: RE | Admit: 2017-09-11 | Discharge: 2017-09-11 | Disposition: A | Discharge status: Home / Self Care | Payer: Medicare Other | Source: Ambulatory Visit | Attending: Family Medicine | Admitting: Family Medicine

## 2017-09-11 VITALS — BP 112/62 | HR 73 | Temp 98.1°F | Ht 70.0 in | Wt 202.7 lb

## 2017-09-11 DIAGNOSIS — R918 Other nonspecific abnormal finding of lung field: Secondary | ICD-10-CM | POA: Diagnosis not present

## 2017-09-11 DIAGNOSIS — R0789 Other chest pain: Secondary | ICD-10-CM

## 2017-09-11 DIAGNOSIS — M546 Pain in thoracic spine: Secondary | ICD-10-CM | POA: Diagnosis not present

## 2017-09-11 NOTE — Telephone Encounter (Signed)
The patient wanted to make sure that his Rx was sent to:  [place], [place] [phone] (Phone) [phone] (Fax)     atorvastatin (LIPITOR) 20 MG tablet sertraline (ZOLOFT) 50 MG tablet olmesartan (BENICAR) 20 MG tablet

## 2017-09-11 NOTE — Patient Instructions (Signed)
Go for CXR at [place] office- Monday to Friday 8:30 AM to 5 PM.

## 2017-09-11 NOTE — Progress Notes (Addendum)
Subjective:     Patient ID: Micheal [name], male   DOB: 1946/06/05, 71 y.o.   MRN: [id number]  HPI Patient is seen today to discuss the following issues  He's had previous back surgery and back in August, developed some pain thoracic back underneath the right shoulder blade region with some radiation down the right arm and worse after prolonged periods of standing or sitting. Relieved with lying down. He took Tylenol, Aleve, and Advil without relief. He saw his neurosurgeon in November and they thought he might have had a pinched nerve in the neck and was prescribed prednisone without any improvement. He had repeat MRI which did not show any clear surgical problem.  His neurosurgeon was concerned that he was having pain radiating toward the chest he suggested pt follow up here with EKG before considering any further therapy or surgery.  Pt tried Gabapentin and Tramadol without relief. Denies any exertional chest pain. No pleuritic pain. No cough. No fevers or chills. No appetite or weight changes.  Patient had CT abdomen and pelvis 2017 with normal gallbladder. No relation to eating. Normal nuclear stress test 11/15. He's also previous CT chest 2015 which showed no acute abnormalities  Patient also complains of increased "stiffness "involving several joints of hands especially early morning. He describes what sounds like trigger finger involving the middle finger of the left and occasionally right hand.  Past Medical History:  Diagnosis Date  . Anxiety   . Benign essential tremor    hands  . Complication of anesthesia 1999   spinal with last knee replacement  . Diverticulitis    last Saturday 03/24/16 -tx with antibiotics  . GERD (gastroesophageal reflux disease)   . H/O balanitis    recurrent intermittant-- uses diprolene cream prn  . History of colon polyps   . History of kidney stones   . History of prostate cancer urologist-  dr Alinda Money-  currently PSA nondetectable 10/ 2016   s/p   radial prostatectomy w/ nerve sparing 07-17-2007/   stage T1c,  Gleason 3+3=6,  PSA 6.33  . Hyperlipidemia   . Hypertension   . Mild obstructive sleep apnea    uses mouth guard only  . Nocturia   . Osteoarthritis    knees  . Sigmoid diverticulosis   . Synovial hypertrophy of left knee   . Urge urinary incontinence    Past Surgical History:  Procedure Laterality Date  . BACK SURGERY     L4 surgery  . CARDIOVASCULAR STRESS TEST  08-04-2014   normal perfusion nuclear study/  normal LV function and wall motion , ef 54%  . COLONOSCOPY W/ POLYPECTOMY  06-21-2014  . JOINT REPLACEMENT    . KNEE ARTHROSCOPY Bilateral right 07-31-2009//  left   . KNEE ARTHROSCOPY Left 12/08/2014   Procedure: ARTHROSCOPY LEFT KNEE WITH SYNOVECTOMY;  Surgeon: Gaynelle Arabian, MD;  Location[facility];  Service: Orthopedics;  Laterality: Left;  . KNEE ARTHROSCOPY Left 02/14/2016   Procedure: ARTHROSCOPY KNEE WTH SYNOVECTOMY;  Surgeon: Gaynelle Arabian, MD;  Location: [facility];  Service: Orthopedics;  Laterality: Left;  . LUMBAR DISC SURGERY  04-13-2002   left  L4 -- L5  . ROBOT ASSISTED LAPAROSCOPIC RADICAL PROSTATECTOMY  07-17-2007  . TONSILLECTOMY  as child  . TOTAL KNEE ARTHROPLASTY Left 03-08-2008  . TOTAL KNEE ARTHROPLASTY Right 04/02/2016   Procedure: RIGHT TOTAL KNEE ARTHROPLASTY;  Surgeon: Gaynelle Arabian, MD;  Location: [facility];  Service: Orthopedics;  Laterality: Right;  reports that  has never smoked. he has never used smokeless tobacco. He reports that he drinks alcohol. He reports that he does not use drugs. family history includes Aneurysm in his father; Heart attack in his unknown relative; Sudden death in his mother. No Known Allergies   Review of Systems  Constitutional: Negative for appetite change, chills, fever and unexpected weight change.  HENT: Negative for trouble swallowing and voice change.   Respiratory: Negative for cough and shortness of breath.    Cardiovascular: Negative for palpitations and leg swelling.  Gastrointestinal: Negative for abdominal pain.  Musculoskeletal: Positive for arthralgias.       Objective:   Physical Exam  Constitutional: He is oriented to person, place, and time. He appears well-developed and well-nourished.  HENT:  Right Ear: External ear normal.  Left Ear: External ear normal.  Mouth/Throat: Oropharynx is clear and moist.  Eyes: Pupils are equal, round, and reactive to light.  Neck: Neck supple. No thyromegaly present.  Cardiovascular: Normal rate and regular rhythm.  Pulmonary/Chest: Effort normal and breath sounds normal. No respiratory distress. He has no wheezes. He has no rales.  Musculoskeletal: He exhibits no edema.  Neurological: He is alert and oriented to person, place, and time.       Assessment:     #1 atypical chest pain. Doubt cardiac related.  Suspect musculoskeletal.    #2 probable osteoarthritis involving hands. He also describes sounds like trigger finger involving the middle finger of the right and left hand    Plan:     -Obtain EKG to further assess.  EKG=NSR with no acute changes. -get chest x-ray -follow up immediately for any exertional chest pain or other concerns. -consider sports medicine referral if pain persists.    [name] MD [facility]   CXR findings:  FINDINGS: There is no edema or consolidation. There is a nodular opacity in the medial segment of the right lower lobe, not felt to be appreciably changed from prior studies. The heart size and pulmonary vascularity are normal. No adenopathy. There is degenerative change in the midthoracic spine.  IMPRESSION: No edema or consolidation. Stable appearing nodular opacity medial segment right lower lobe. Stable cardiac silhouette.  I reviewed results above with patient.  He has concerns regarding degenerative changes in thoracic spine and whether these could be related to  current symptoms.  I will pass on results to his neurosurgeon and he has follow up there next week.  [name] MD [facility]

## 2017-09-12 ENCOUNTER — Telehealth: Payer: Self-pay | Admitting: Family Medicine

## 2017-09-12 NOTE — Telephone Encounter (Signed)
Spoke with patient. He had questions regarding chest x-ray and these were answered. He specifically questioned regarding degenerative changes thoracic spine. We will send note along to his neurosurgeon updating this x-ray finding and our office visit.

## 2017-09-12 NOTE — Telephone Encounter (Signed)
CXR results and office notes from 12/26 faxed to Dr Joya Salm at [phone].

## 2017-09-12 NOTE — Telephone Encounter (Signed)
Copied from [place] [id number]. Topic: Quick Communication - See Telephone Encounter >> Sep 12, 2017 12:10 PM Ether Griffins B wrote: CRM for notification. See Telephone encounter for:  Pt has questions regarding chest xray results and would like Dr. Elease Hashimoto to give him a call  09/12/17.

## 2017-09-13 NOTE — Telephone Encounter (Signed)
I called [facility] [phone]) and spoke with Caryl Pina and asked that she send the faxed information below to Dr [name], not Dr Joya Salm.  Caryl Pina stated Dr Joya Salm is no longer with the group and the reports will go to the correct physician.

## 2017-09-19 ENCOUNTER — Other Ambulatory Visit: Payer: Self-pay | Admitting: Family Medicine

## 2017-09-19 MED ORDER — ATORVASTATIN CALCIUM 20 MG PO TABS: ORAL_TABLET | ORAL | 2 refills | Status: DC

## 2017-09-19 MED ORDER — OLMESARTAN MEDOXOMIL 20 MG PO TABS: 20.0000 mg | ORAL_TABLET | Freq: Every day | ORAL | 2 refills | Status: DC

## 2017-09-19 MED ORDER — SERTRALINE HCL 50 MG PO TABS: 50.0000 mg | ORAL_TABLET | Freq: Every day | ORAL | 2 refills | Status: DC

## 2017-09-19 NOTE — Telephone Encounter (Signed)
Medications were sent to pts pharmacy.

## 2017-09-23 ENCOUNTER — Other Ambulatory Visit: Payer: Self-pay | Admitting: Neurosurgery

## 2017-09-23 DIAGNOSIS — M546 Pain in thoracic spine: Secondary | ICD-10-CM

## 2017-09-25 ENCOUNTER — Ambulatory Visit
Admission: RE | Admit: 2017-09-25 | Discharge: 2017-09-25 | Disposition: A | Discharge status: Home / Self Care | Payer: Medicare Other | Source: Ambulatory Visit | Attending: Neurosurgery | Admitting: Neurosurgery

## 2017-09-25 DIAGNOSIS — M5124 Other intervertebral disc displacement, thoracic region: Secondary | ICD-10-CM | POA: Diagnosis not present

## 2017-09-25 DIAGNOSIS — M546 Pain in thoracic spine: Secondary | ICD-10-CM

## 2017-10-02 DIAGNOSIS — M4722 Other spondylosis with radiculopathy, cervical region: Secondary | ICD-10-CM | POA: Diagnosis not present

## 2017-10-08 DIAGNOSIS — M4722 Other spondylosis with radiculopathy, cervical region: Secondary | ICD-10-CM | POA: Diagnosis not present

## 2017-10-10 DIAGNOSIS — M4722 Other spondylosis with radiculopathy, cervical region: Secondary | ICD-10-CM | POA: Diagnosis not present

## 2017-10-15 DIAGNOSIS — M4722 Other spondylosis with radiculopathy, cervical region: Secondary | ICD-10-CM | POA: Diagnosis not present

## 2017-10-21 DIAGNOSIS — M4722 Other spondylosis with radiculopathy, cervical region: Secondary | ICD-10-CM | POA: Diagnosis not present

## 2017-11-13 DIAGNOSIS — M65332 Trigger finger, left middle finger: Secondary | ICD-10-CM | POA: Diagnosis not present

## 2017-11-13 DIAGNOSIS — M65341 Trigger finger, right ring finger: Secondary | ICD-10-CM | POA: Diagnosis not present

## 2017-11-13 DIAGNOSIS — M13842 Other specified arthritis, left hand: Secondary | ICD-10-CM | POA: Diagnosis not present

## 2017-11-27 DIAGNOSIS — Z6828 Body mass index (BMI) 28.0-28.9, adult: Secondary | ICD-10-CM | POA: Diagnosis not present

## 2017-11-27 DIAGNOSIS — M546 Pain in thoracic spine: Secondary | ICD-10-CM | POA: Diagnosis not present

## 2017-11-27 DIAGNOSIS — I10 Essential (primary) hypertension: Secondary | ICD-10-CM | POA: Diagnosis not present

## 2017-12-11 DIAGNOSIS — M65341 Trigger finger, right ring finger: Secondary | ICD-10-CM | POA: Diagnosis not present

## 2017-12-11 DIAGNOSIS — M65332 Trigger finger, left middle finger: Secondary | ICD-10-CM | POA: Diagnosis not present

## 2017-12-11 DIAGNOSIS — M13842 Other specified arthritis, left hand: Secondary | ICD-10-CM | POA: Diagnosis not present

## 2017-12-23 ENCOUNTER — Telehealth: Payer: Self-pay | Admitting: Family Medicine

## 2017-12-23 ENCOUNTER — Other Ambulatory Visit: Payer: Self-pay | Admitting: *Deleted

## 2017-12-23 MED ORDER — OLMESARTAN MEDOXOMIL 20 MG PO TABS: 20.0000 mg | ORAL_TABLET | Freq: Every day | ORAL | 0 refills | Status: DC

## 2017-12-23 NOTE — Telephone Encounter (Signed)
Rx sent to local Rx - per patient request- mail order is out of stock now.

## 2017-12-23 NOTE — Telephone Encounter (Signed)
Copied from [place]. Topic: Quick Communication - Rx Refill/Question >> Dec 23, 2017  9:47 AM Arletha Grippe wrote: Medication: olmesartan (BENICAR) 20 MG tablet Has the patient contacted their pharmacy? Yes.   (Agent: If no, request that the patient contact the pharmacy for the refill.) Preferred Pharmacy (with phone number or street name): [facility] [place], [address] [phone] (Phone) [phone] (Fax)     Agent: Please be advised that RX refills may take up to 3 business days. We ask that you follow-up with your pharmacy.  Pt needs 90 day supply. The pharm he normally uses is out and doesn't know when they will have more in stock.

## 2018-02-04 ENCOUNTER — Encounter (INDEPENDENT_AMBULATORY_CARE_PROVIDER_SITE_OTHER): Payer: Self-pay | Admitting: Physical Medicine and Rehabilitation

## 2018-02-04 ENCOUNTER — Ambulatory Visit (INDEPENDENT_AMBULATORY_CARE_PROVIDER_SITE_OTHER): Payer: Medicare Other | Admitting: Physical Medicine and Rehabilitation

## 2018-02-04 VITALS — BP 129/76 | HR 61 | Temp 98.7°F | Ht 73.0 in | Wt 203.0 lb

## 2018-02-04 DIAGNOSIS — M542 Cervicalgia: Secondary | ICD-10-CM | POA: Diagnosis not present

## 2018-02-04 DIAGNOSIS — R0789 Other chest pain: Secondary | ICD-10-CM | POA: Diagnosis not present

## 2018-02-04 DIAGNOSIS — M898X1 Other specified disorders of bone, shoulder: Secondary | ICD-10-CM

## 2018-02-04 DIAGNOSIS — M5412 Radiculopathy, cervical region: Secondary | ICD-10-CM

## 2018-02-04 DIAGNOSIS — M4802 Spinal stenosis, cervical region: Secondary | ICD-10-CM

## 2018-02-04 DIAGNOSIS — M47812 Spondylosis without myelopathy or radiculopathy, cervical region: Secondary | ICD-10-CM

## 2018-02-04 NOTE — Progress Notes (Signed)
 .  Numeric Pain Rating Scale and Functional Assessment Average Pain 6 Pain Right Now 1 My pain is intermittent, burning and dull Pain is worse with: walking, sitting and some activites Pain improves with: rest   In the last MONTH (on 0-10 scale) has pain interfered with the following?  1. General activity like being  able to carry out your everyday physical activities such as walking, climbing stairs, carrying groceries, or moving a chair?  Rating(5)  2. Relation with others like being able to carry out your usual social activities and roles such as  activities at home, at work and in your community. Rating(8)  3. Enjoyment of life such that you have  been bothered by emotional problems such as feeling anxious, depressed or irritable?  Rating(4)

## 2018-02-25 ENCOUNTER — Ambulatory Visit (INDEPENDENT_AMBULATORY_CARE_PROVIDER_SITE_OTHER): Payer: Medicare Other | Admitting: Physical Medicine and Rehabilitation

## 2018-02-25 ENCOUNTER — Ambulatory Visit (INDEPENDENT_AMBULATORY_CARE_PROVIDER_SITE_OTHER): Payer: Medicare Other

## 2018-02-25 ENCOUNTER — Encounter (INDEPENDENT_AMBULATORY_CARE_PROVIDER_SITE_OTHER): Payer: Self-pay | Admitting: Physical Medicine and Rehabilitation

## 2018-02-25 VITALS — BP 115/72 | HR 64 | Temp 97.2°F

## 2018-02-25 DIAGNOSIS — M5412 Radiculopathy, cervical region: Secondary | ICD-10-CM

## 2018-02-25 MED ORDER — METHYLPREDNISOLONE ACETATE 80 MG/ML IJ SUSP
80.0000 mg | Freq: Once | INTRAMUSCULAR | Status: AC
  Administered 2018-02-25: 80 mg

## 2018-02-25 NOTE — Patient Instructions (Signed)

## 2018-02-25 NOTE — Progress Notes (Signed)
 .  Numeric Pain Rating Scale and Functional Assessment Average Pain 5   In the last MONTH (on 0-10 scale) has pain interfered with the following?  1. General activity like being  able to carry out your everyday physical activities such as walking, climbing stairs, carrying groceries, or moving a chair?  Rating(3)   +Driver, -BT, -Dye Allergies.  

## 2018-02-26 ENCOUNTER — Other Ambulatory Visit: Payer: Self-pay | Admitting: Dermatology

## 2018-02-26 ENCOUNTER — Encounter (INDEPENDENT_AMBULATORY_CARE_PROVIDER_SITE_OTHER): Payer: Self-pay | Admitting: Physical Medicine and Rehabilitation

## 2018-02-26 DIAGNOSIS — D229 Melanocytic nevi, unspecified: Secondary | ICD-10-CM | POA: Diagnosis not present

## 2018-02-26 DIAGNOSIS — C4491 Basal cell carcinoma of skin, unspecified: Secondary | ICD-10-CM

## 2018-02-26 DIAGNOSIS — C44311 Basal cell carcinoma of skin of nose: Secondary | ICD-10-CM | POA: Diagnosis not present

## 2018-02-26 DIAGNOSIS — L570 Actinic keratosis: Secondary | ICD-10-CM | POA: Diagnosis not present

## 2018-02-26 DIAGNOSIS — L821 Other seborrheic keratosis: Secondary | ICD-10-CM | POA: Diagnosis not present

## 2018-02-26 HISTORY — DX: Basal cell carcinoma of skin, unspecified: C44.91

## 2018-02-26 NOTE — Progress Notes (Signed)
Micheal [name] - 72 y.o. male MRN [id number]  Date of birth: 1945-11-20  Office Visit Note: Visit Date: 02/25/2018 PCP: [name], MD Referred by: [name], MD  Subjective: Chief Complaint  Patient presents with  . Neck - Pain  . Middle Back - Pain   HPI: Micheal [name] is a 72 year old right-hand-dominant gentleman that comes in today for planned right C7-T1 interlaminar epidural steroid injection.  No real change in symptoms of neck and shoulder blade and mid back pain.  He has had some worsening low back pain however.  I would be happy to evaluate that out of different date.  We will have him back in a few weeks for office visit.  Please see our prior evaluation and management note for further details and justification.   ROS Otherwise per HPI.  Assessment & Plan: Visit Diagnoses:  1. Cervical radiculopathy     Plan: No additional findings.   Meds & Orders:  Meds ordered this encounter  Medications  . methylPREDNISolone acetate (DEPO-MEDROL) injection 80 mg    Orders Placed This Encounter  Procedures  . XR C-ARM NO REPORT  . Epidural Steroid injection    Follow-up: Return in about 2 weeks (around 03/11/2018).   Procedures: No procedures performed  Cervical Epidural Steroid Injection - Interlaminar Approach with Fluoroscopic Guidance  Patient: Micheal [name]      Date of Birth: [birth year] MRN: [id number] PCP: [name], MD      Visit Date: 02/25/2018   Universal Protocol:    Date/Time: 06/12/196:07 AM  Consent Given By: the patient  Position: PRONE  Additional Comments: Vital signs were monitored before and after the procedure. Patient was prepped and draped in the usual sterile fashion. The correct patient, procedure, and site was verified.   Injection Procedure Details:  Procedure Site One Meds Administered:  Meds ordered this encounter  Medications  . methylPREDNISolone acetate (DEPO-MEDROL) injection 80 mg      Laterality: Right  Location/Site: C7-T1  Needle size: 20 G  Needle type: Touhy  Needle Placement: Paramedian epidural space  Findings:  -Comments: Excellent flow of contrast into the epidural space.  Procedure Details: Using a paramedian approach from the side mentioned above, the region overlying the inferior lamina was localized under fluoroscopic visualization and the soft tissues overlying this structure were infiltrated with 4 ml. of 1% Lidocaine without Epinephrine. A # 20 gauge, Tuohy needle was inserted into the epidural space using a paramedian approach.  The epidural space was localized using loss of resistance along with lateral and contralateral oblique bi-planar fluoroscopic views.  After negative aspirate for air, blood, and CSF, a 2 ml. volume of Isovue-250 was injected into the epidural space and the flow of contrast was observed. Radiographs were obtained for documentation purposes.   The injectate was administered into the level noted above.  Additional Comments:  The patient tolerated the procedure well Dressing: Band-Aid    Post-procedure details: Patient was observed during the procedure. Post-procedure instructions were reviewed.  Patient left the clinic in stable condition.    Clinical History: MRI THORACIC SPINE WITHOUT CONTRAST  TECHNIQUE: Multiplanar, multisequence MR imaging of the thoracic spine was performed. No intravenous contrast was administered.  COMPARISON:  Chest radiography 09/11/2017.  FINDINGS: Alignment:  Normal  Vertebrae: No fracture or primary bone lesion.  Cord:  No cord compression or primary cord lesion.  Paraspinal and other soft tissues: Normal  Disc levels:  Cervical scout study shows degenerative spondylosis at  C4-5, C5-6 and C6-7.  T1-2: Normal.  T2-3: Normal.  T3-4: Shallow left-sided disc bulge. No stenosis or neural compression.  T4-5: Shallow left-sided disc bulge. No stenosis or  neural compression.  T5-6: Endplate osteophytes and bulging of the disc. Narrowing of the ventral subarachnoid space but no compressive effect upon the canal or foramina.  T6-7: Left-sided disc bulge.  No stenosis or neural compression.  T7-8: Mild bulging of the disc.  No stenosis or neural compression.  T8-9: Mild bulging of the disc.  No stenosis or neural compression.  T9-10: Normal interspace.  T10-11: Left paracentral disc bulge. No stenosis or neural compression.  T11-12: Normal interspace.  T12-L1: Normal interspace.  No significant facet arthropathy.  IMPRESSION: No cause of the presenting symptoms is identified. Mild degenerative changes of the spine for a person of this age. No advanced disease, neural compressive findings or active bone or joint pathology seen.   Electronically Signed   By: Nelson Chimes M.D.   On: 09/25/2017 08:40  MRI CERVICAL SPINE WITHOUT CONTRAST  TECHNIQUE: Multiplanar, multisequence MR imaging of the cervical spine was performed. No intravenous contrast was administered.  COMPARISON: None.  FINDINGS: Alignment: Physiologic.  Vertebrae: No fracture, evidence of discitis, or bone lesion.  Cord: Normal signal.  Posterior Fossa, vertebral arteries, paraspinal tissues: Negative.  Disc levels:  C2-3: Minimal posterior disc bulging. No impingement  C3-4: Disc narrowing with posterior disc osteophyte complex and uncovertebral ridging. Mild bilateral foraminal narrowing, with patency best depicted on axial T1 weighted imaging. Partial ventral subarachnoid space effacement. No cord compression  C4-5: Spondylosis and degenerative disc narrowing with left more than right uncovertebral ridging. Mild left foraminal narrowing. Ventral subarachnoid space effacement without cord compression.  C5-6: Degenerative disc narrowing with right more than left uncovertebral ridging. Posterior disc osteophyte complex  and ligamentum flavum thickening causes spinal stenosis with ventral cord flattening. Right foraminal stenosis is moderate.  C6-7: Disc degeneration with left preferential posterior disc osteophyte complex. Left more than right uncovertebral ridging. Moderate left foraminal narrowing. Spinal stenosis with mild ventral cord flattening greater towards the left. No cord compression.  C7-T1:Facet spurring. No impingement  IMPRESSION: 1. Disc degeneration most notable at C5-6 and C6-7 where there is spinal stenosis causing ventral cord flattening. 2. Uncovertebral ridging causes moderate foraminal on the right at C5-6 and left at C6-7.   Electronically Signed By: Monte Fantasia M.D. On: 08/28/2017 14:28   He reports that he has never smoked. He has never used smokeless tobacco. No results for input(s): HGBA1C, LABURIC in the last 8760 hours.  Objective:  VS:  HT:    WT:   BMI:     BP:115/72  HR:64bpm  TEMP:(!) 97.2 F (36.2 C)( )  RESP:97 % Physical Exam  Ortho Exam Imaging: Xr C-arm No Report  Result Date: 02/25/2018 Please see Notes or Procedures tab for imaging impression.   Past Medical/Family/Surgical/Social History: Medications & Allergies reviewed per EMR, new medications updated. Patient Active Problem List   Diagnosis Date Noted  . Knee pain, bilateral 12/18/2016  . Lumbar degenerative disc disease 12/18/2016  . OA (osteoarthritis) of knee 04/02/2016  . Synovitis of knee 12/08/2014  . Peripheral neuropathy 11/11/2013  . Chronic anxiety 10/26/2013  . Plantar fasciitis of left foot 01/01/2013  . Osteoarthritis of right knee 01/01/2013  . Tear, knee, lateral collateral ligament 01/01/2013  . INSOMNIA, CHRONIC 10/17/2010  . FATIGUE 10/17/2010  . TREMOR, ESSENTIAL 07/20/2010  . Hyperlipidemia 03/03/2010  . Essential hypertension 03/03/2010  . ADENOCARCINOMA,  PROSTATE, HX OF 03/03/2010  . NEPHROLITHIASIS, HX OF 03/03/2010   Past Medical History:   Diagnosis Date  . Anxiety   . Benign essential tremor    hands  . Complication of anesthesia 1999   spinal with last knee replacement  . Diverticulitis    last Saturday 03/24/16 -tx with antibiotics  . GERD (gastroesophageal reflux disease)   . H/O balanitis    recurrent intermittant-- uses diprolene cream prn  . History of colon polyps   . History of kidney stones   . History of prostate cancer urologist-  dr Alinda Money-  currently PSA nondetectable 10/ 2016   s/p  radial prostatectomy w/ nerve sparing 07-17-2007/   stage T1c,  Gleason 3+3=6,  PSA 6.33  . Hyperlipidemia   . Hypertension   . Mild obstructive sleep apnea    uses mouth guard only  . Nocturia   . Osteoarthritis    knees  . Sigmoid diverticulosis   . Synovial hypertrophy of left knee   . Urge urinary incontinence    Family History  Problem Relation Age of Onset  . Sudden death Mother   . Aneurysm Father   . Heart attack Unknown        grandfather   Past Surgical History:  Procedure Laterality Date  . BACK SURGERY     L4 surgery  . CARDIOVASCULAR STRESS TEST  08-04-2014   normal perfusion nuclear study/  normal LV function and wall motion , ef 54%  . COLONOSCOPY W/ POLYPECTOMY  06-21-2014  . JOINT REPLACEMENT    . KNEE ARTHROSCOPY Bilateral right 07-31-2009//  left   . KNEE ARTHROSCOPY Left 12/08/2014   Procedure: ARTHROSCOPY LEFT KNEE WITH SYNOVECTOMY;  Surgeon: Gaynelle Arabian, MD;  [facility];  Service: Orthopedics;  Laterality: Left;  . KNEE ARTHROSCOPY Left 02/14/2016   Procedure: ARTHROSCOPY KNEE WTH SYNOVECTOMY;  Surgeon: Gaynelle Arabian, MD;  Location: [facility];  Service: Orthopedics;  Laterality: Left;  . LUMBAR DISC SURGERY  04-13-2002   left  L4 -- L5  . ROBOT ASSISTED LAPAROSCOPIC RADICAL PROSTATECTOMY  07-17-2007  . TONSILLECTOMY  as child  . TOTAL KNEE ARTHROPLASTY Left 03-08-2008  . TOTAL KNEE ARTHROPLASTY Right 04/02/2016   Procedure: RIGHT TOTAL KNEE  ARTHROPLASTY;  Surgeon: Gaynelle Arabian, MD;  Location: [facility];  Service: Orthopedics;  Laterality: Right;   Social History   Occupational History  . 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 Smoking status: Never Smoker  . Smokeless tobacco: Never Used  Substance and Sexual Activity  . Alcohol use: Yes    Comment: social  . Drug use: No  . Sexual activity: Not on file

## 2018-02-26 NOTE — Procedures (Signed)
Cervical Epidural Steroid Injection - Interlaminar Approach with Fluoroscopic Guidance  Patient: Micheal [name]      Date of Birth: 1946-08-10 MRN: [id number] PCP: [name], MD      Visit Date: 02/25/2018   Universal Protocol:    Date/Time: 06/12/196:07 AM  Consent Given By: the patient  Position: PRONE  Additional Comments: Vital signs were monitored before and after the procedure. Patient was prepped and draped in the usual sterile fashion. The correct patient, procedure, and site was verified.   Injection Procedure Details:  Procedure Site One Meds Administered:  Meds ordered this encounter  Medications  . methylPREDNISolone acetate (DEPO-MEDROL) injection 80 mg     Laterality: Right  Location/Site: C7-T1  Needle size: 20 G  Needle type: Touhy  Needle Placement: Paramedian epidural space  Findings:  -Comments: Excellent flow of contrast into the epidural space.  Procedure Details: Using a paramedian approach from the side mentioned above, the region overlying the inferior lamina was localized under fluoroscopic visualization and the soft tissues overlying this structure were infiltrated with 4 ml. of 1% Lidocaine without Epinephrine. A # 20 gauge, Tuohy needle was inserted into the epidural space using a paramedian approach.  The epidural space was localized using loss of resistance along with lateral and contralateral oblique bi-planar fluoroscopic views.  After negative aspirate for air, blood, and CSF, a 2 ml. volume of Isovue-250 was injected into the epidural space and the flow of contrast was observed. Radiographs were obtained for documentation purposes.   The injectate was administered into the level noted above.  Additional Comments:  The patient tolerated the procedure well Dressing: Band-Aid    Post-procedure details: Patient was observed during the procedure. Post-procedure instructions were reviewed.  Patient left the clinic in  stable condition.

## 2018-03-14 ENCOUNTER — Encounter (INDEPENDENT_AMBULATORY_CARE_PROVIDER_SITE_OTHER): Payer: Self-pay | Admitting: Physical Medicine and Rehabilitation

## 2018-03-26 ENCOUNTER — Encounter (INDEPENDENT_AMBULATORY_CARE_PROVIDER_SITE_OTHER): Payer: Self-pay | Admitting: Physical Medicine and Rehabilitation

## 2018-03-26 ENCOUNTER — Ambulatory Visit (INDEPENDENT_AMBULATORY_CARE_PROVIDER_SITE_OTHER): Payer: Medicare Other | Admitting: Physical Medicine and Rehabilitation

## 2018-03-26 ENCOUNTER — Ambulatory Visit (INDEPENDENT_AMBULATORY_CARE_PROVIDER_SITE_OTHER): Payer: Self-pay

## 2018-03-26 VITALS — BP 141/88 | HR 59 | Temp 98.6°F | Ht 70.0 in | Wt 200.0 lb

## 2018-03-26 DIAGNOSIS — R269 Unspecified abnormalities of gait and mobility: Secondary | ICD-10-CM | POA: Diagnosis not present

## 2018-03-26 DIAGNOSIS — G250 Essential tremor: Secondary | ICD-10-CM

## 2018-03-26 DIAGNOSIS — G8929 Other chronic pain: Secondary | ICD-10-CM

## 2018-03-26 DIAGNOSIS — M961 Postlaminectomy syndrome, not elsewhere classified: Secondary | ICD-10-CM

## 2018-03-26 DIAGNOSIS — M546 Pain in thoracic spine: Secondary | ICD-10-CM | POA: Diagnosis not present

## 2018-03-26 DIAGNOSIS — R0781 Pleurodynia: Secondary | ICD-10-CM | POA: Diagnosis not present

## 2018-03-26 DIAGNOSIS — M7918 Myalgia, other site: Secondary | ICD-10-CM | POA: Diagnosis not present

## 2018-03-26 DIAGNOSIS — G609 Hereditary and idiopathic neuropathy, unspecified: Secondary | ICD-10-CM | POA: Diagnosis not present

## 2018-03-26 MED ORDER — GABAPENTIN 600 MG PO TABS: 600.0000 mg | ORAL_TABLET | Freq: Three times a day (TID) | ORAL | 1 refills | Status: DC

## 2018-03-26 NOTE — Progress Notes (Signed)
 .  Numeric Pain Rating Scale and Functional Assessment Average Pain 8 Pain Right Now 5 My pain is intermittent, constant, burning and aching Pain is worse with: standing and some activites Pain improves with: rest   In the last MONTH (on 0-10 scale) has pain interfered with the following?  1. General activity like being  able to carry out your everyday physical activities such as walking, climbing stairs, carrying groceries, or moving a chair?  Rating(8)  2. Relation with others like being able to carry out your usual social activities and roles such as  activities at home, at work and in your community. Rating(8)  3. Enjoyment of life such that you have  been bothered by emotional problems such as feeling anxious, depressed or irritable?  Rating(5)

## 2018-03-27 ENCOUNTER — Encounter (INDEPENDENT_AMBULATORY_CARE_PROVIDER_SITE_OTHER): Payer: Self-pay | Admitting: Physical Medicine and Rehabilitation

## 2018-03-27 ENCOUNTER — Encounter: Payer: Self-pay | Admitting: Neurology

## 2018-03-27 DIAGNOSIS — M4802 Spinal stenosis, cervical region: Secondary | ICD-10-CM | POA: Insufficient documentation

## 2018-03-27 DIAGNOSIS — M898X1 Other specified disorders of bone, shoulder: Secondary | ICD-10-CM | POA: Insufficient documentation

## 2018-03-27 DIAGNOSIS — R0789 Other chest pain: Secondary | ICD-10-CM | POA: Insufficient documentation

## 2018-03-27 MED ORDER — METHYLPREDNISOLONE ACETATE 80 MG/ML IJ SUSP: 40.0000 mg | Freq: Once | INTRAMUSCULAR | Status: DC

## 2018-03-27 NOTE — Progress Notes (Signed)
Caylin Raby Belshe - 72 y.o. male MRN [id number]  Date of birth: [birth year]  Office Visit Note: Visit Date: 03/26/2018 PCP: [name], MD Referred by: [name], MD  Subjective: Chief Complaint  Patient presents with  . Middle Back - Pain  . Chest - Pain  . Fall  . Weakness   HPI: Mr. Balaban is a 72 year old gentleman that returns today after having had cervical epidural injection.  Unfortunately had no relief of any of his symptoms.  He continues to have this mid back inferior angle scapular pain and possibly rib pain in this area is been ongoing since August 2018.  Please see our prior notes for further details of this.  Again reports really worsening pain with just any activity standing sitting for long period of time but is able to finish in cast his fishing on really without difficulty.  He does state that resting and lying down does make it better.  He continues to take gabapentin 600 mg 3 times a day.  He tells me that the shot really did not help much she does not feel like it made anything worse but he does feel like his anterior chest pain has worsened and is now fairly constant and it is a burning sensation.  Again it is medial to the nipple line on the right anterior chest probably coming up to the 11th rib possibly where he points.  He denies any increasing pain with inspiration although coughing does seem to increase his symptoms quite a bit.  The symptoms seem to be almost spastic in a way where he will get increasing symptoms that will really get his attention and then they seem to die down but are constant in general.  The back pain which is mid back pain has stayed fairly constant as well but can be periods where he does not feel that much at all.  He has had no more tingling or radicular symptoms in the arms.  Continued mild neck pain but not really his biggest issue.  He does endorse several other symptoms today which he is unsure if they are related but wanted  to tell me.  He has had this essential tremor in his hands that he feels like is worsened to a degree.  He evidently saw Dr. Wells Guiles Tat several years ago when diagnosis as essential tremor.  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  He does have a history of lumbar discectomy with some left-sided weakness with dorsiflexion.  He feels like that has gotten stronger though since that surgery in 2003 but just recently he has noticed balance difficulties and gait problems.  He does not endorse retropulsion.  He has not had any shuffling.  He does get stiff at times.  Other symptoms he is talking about today include sharp pain on the right temple area.  This was very brief but it did get his attention as happened on a few occasions.  It has been fairly random.  Has not been really any lancinating pain or vision changes.  He has had some pain in the shoulders as well.  He has had no new injuries or sickness.   Review of Systems  Constitutional: Negative for chills, fever, malaise/fatigue and weight loss.  HENT: Negative for hearing loss and sinus pain.   Eyes: Negative for  blurred vision, double vision and photophobia.  Respiratory: Negative for cough and shortness of breath.   Cardiovascular: Negative for chest pain, palpitations and leg swelling.  Gastrointestinal: Negative for abdominal pain, nausea and vomiting.  Genitourinary: Negative for flank pain.  Musculoskeletal: Positive for back pain, falls and neck pain. Negative for myalgias.  Skin: Negative for itching and rash.  Neurological: Positive for tremors. Negative for focal weakness and weakness.  Endo/Heme/Allergies: Negative.   Psychiatric/Behavioral: Negative for depression.  All other systems reviewed and are negative.  Otherwise per HPI.  Assessment & Plan: Visit Diagnoses:  1. Rib pain   2. Gait abnormality   3.  Chronic right-sided thoracic back pain   4. Myofascial pain syndrome   5. Tremor, essential   6. Post laminectomy syndrome   7. Idiopathic peripheral neuropathy     Plan: Findings:  1.  Continued chronic right parascapular pain or possibly rib pain which we felt like could have been related to his cervical stenosis but obviously at this point where not really looking at that since the epidural injection was really not very helpful and he has been reviewed by Dr. [name] as well he felt like his pain probably was not coming from his cervical spine.  Thoracic spine MRI has been completed recently which is fairly normal.  Next on the list of possible problems include for his back pain would be myofascial pain syndrome with trigger point in the same region and near the ribs.  Secondarily would be intercostal neuritis.  I think the first step is to try localized muscle injection or trigger point injection at the area of tenderness anteriorly along that rib using fluoroscopic guidance and this is done today.  Depending on relief would look at the same type of injection with fluoroscopic guidance around the scapular.  Depending on that outcome if he still was is not getting much relief at least one time I would try an intercostal nerve block.  Otherwise he can continue with his current activities I do not think there is anything from his history that would say activity would be a problem.  He does not have worsening symptoms with fishing and doing things with his arms and hands in general.  He has a exam which is not showing any focal weakness.  We are going to refill his gabapentin medication but I want him to taper this slowly and I gave him directions on how to taper this.  We will see if we taper him completely off of this changes any of his symptoms.  He does have some level of generalized anxiety disorder.  He has been treated with Zoloft as well as Xanax in the past.  We do see this sometimes with myofascial  pain and anxiety at least to a degree they seem to be sometimes related.  I do not think his symptoms are a fibromyalgia type symptom as they are too specific.  2.  New symptoms today included gait abnormality and balance difficulties and concerns for random pains generated around the temple on the right.  He has an essential tremor he is seeing Dr. Wells Guiles Tat in the past.  I think it would be wise regrouping with her one time to see if there is anything new going on he had seen her in several years evidently.  I did notice any signs today concerning for Parkinson's but I would like for her to at least review his case and just see if there is anything she  can think of from a neurologic standpoint that would relate a lot of his symptoms which are not classic for spine issues in general.    Meds & Orders:  Meds ordered this encounter  Medications  . gabapentin (NEURONTIN) 600 MG tablet    Sig: Take 1 tablet (600 mg total) by mouth 3 (three) times daily. Slow taper off as directed.    Dispense:  120 tablet    Refill:  1  . methylPREDNISolone acetate (DEPO-MEDROL) injection 40 mg    Orders Placed This Encounter  Procedures  . Nerve Block  . XR C-ARM NO REPORT  . Ambulatory referral to Neurology    Follow-up: Return in about 2 weeks (around 04/09/2018).   Procedures: No procedures performed  Fluoroscopically guided muscle tendon injection of the anterior chest wall  Patient: Micheal [name]      Date of Birth: 04/11/1946 MRN: [id number] PCP: [name], MD      Visit Date: 03/26/2018   Universal Protocol:    Date/Time: 07/11/196:25 AM  Consent Given By: the patient  Position: supine  Additional Comments: Vital signs were monitored before and after the procedure. Patient was prepped and draped in the usual sterile fashion. The correct patient, procedure, and site was verified.   Injection Procedure Details:  Procedure Site One Meds Administered:  Meds ordered this  encounter  Medications  . gabapentin (NEURONTIN) 600 MG tablet    Sig: Take 1 tablet (600 mg total) by mouth 3 (three) times daily. Slow taper off as directed.    Dispense:  120 tablet    Refill:  1  . methylPREDNISolone acetate (DEPO-MEDROL) injection 40 mg     Laterality: Right  Location/Site: Anterior chest wall along the rib margin of approximately the 11th rib in the area of tenderness and his typical pain pattern   Needle size: 22 G  Needle type: spinal needle  Needle Placement: along rib margin   Findings:   -Comments: Excellent flow contrast not showing any vascular flow and showing this to be located on or about the rib with good endpoint feel of the needle.  Procedure Details: Area of significant pain was palpated along the anterior medial rib margin of the chest wall.  After this location was marked and the patient was prepped in sterile fashion fluoroscopic guidance was utilized to introduce a 27-gauge needle for local anesthetic which was 1% lidocaine without epinephrine.  A 22-gauge spinal needle was then introduced in the same area and with fluoroscopic guidance was positioned along the rib closest to this point.  After confirmation of negative blood and air aspiration, the above solution was injected freely without resistance.   Additional Comments:  The patient tolerated the procedure well No complications occurred Dressing: Band-Aid    Post-procedure details: Patient was observed during the procedure. Post-procedure instructions were reviewed.  Patient left the clinic in stable condition.    Clinical History: MRI THORACIC SPINE WITHOUT CONTRAST  TECHNIQUE: Multiplanar, multisequence MR imaging of the thoracic spine was performed. No intravenous contrast was administered.  COMPARISON:  Chest radiography 09/11/2017.  FINDINGS: Alignment:  Normal  Vertebrae: No fracture or primary bone lesion.  Cord:  No cord compression or primary cord  lesion.  Paraspinal and other soft tissues: Normal  Disc levels:  Cervical scout study shows degenerative spondylosis at C4-5, C5-6 and C6-7.  T1-2: Normal.  T2-3: Normal.  T3-4: Shallow left-sided disc bulge. No stenosis or neural compression.  T4-5: Shallow left-sided disc bulge. No stenosis  or neural compression.  T5-6: Endplate osteophytes and bulging of the disc. Narrowing of the ventral subarachnoid space but no compressive effect upon the canal or foramina.  T6-7: Left-sided disc bulge.  No stenosis or neural compression.  T7-8: Mild bulging of the disc.  No stenosis or neural compression.  T8-9: Mild bulging of the disc.  No stenosis or neural compression.  T9-10: Normal interspace.  T10-11: Left paracentral disc bulge. No stenosis or neural compression.  T11-12: Normal interspace.  T12-L1: Normal interspace.  No significant facet arthropathy.  IMPRESSION: No cause of the presenting symptoms is identified. Mild degenerative changes of the spine for a person of this age. No advanced disease, neural compressive findings or active bone or joint pathology seen.   Electronically Signed   By: Nelson Chimes M.D.   On: 09/25/2017 08:40  MRI CERVICAL SPINE WITHOUT CONTRAST  TECHNIQUE: Multiplanar, multisequence MR imaging of the cervical spine was performed. No intravenous contrast was administered.  COMPARISON: None.  FINDINGS: Alignment: Physiologic.  Vertebrae: No fracture, evidence of discitis, or bone lesion.  Cord: Normal signal.  Posterior Fossa, vertebral arteries, paraspinal tissues: Negative.  Disc levels:  C2-3: Minimal posterior disc bulging. No impingement  C3-4: Disc narrowing with posterior disc osteophyte complex and uncovertebral ridging. Mild bilateral foraminal narrowing, with patency best depicted on axial T1 weighted imaging. Partial ventral subarachnoid space effacement. No cord  compression  C4-5: Spondylosis and degenerative disc narrowing with left more than right uncovertebral ridging. Mild left foraminal narrowing. Ventral subarachnoid space effacement without cord compression.  C5-6: Degenerative disc narrowing with right more than left uncovertebral ridging. Posterior disc osteophyte complex and ligamentum flavum thickening causes spinal stenosis with ventral cord flattening. Right foraminal stenosis is moderate.  C6-7: Disc degeneration with left preferential posterior disc osteophyte complex. Left more than right uncovertebral ridging. Moderate left foraminal narrowing. Spinal stenosis with mild ventral cord flattening greater towards the left. No cord compression.  C7-T1:Facet spurring. No impingement  IMPRESSION: 1. Disc degeneration most notable at C5-6 and C6-7 where there is spinal stenosis causing ventral cord flattening. 2. Uncovertebral ridging causes moderate foraminal on the right at C5-6 and left at C6-7.   Electronically Signed By: Monte Fantasia M.D. On: 08/28/2017 14:28   He reports that he has never smoked. He has never used smokeless tobacco. No results for input(s): HGBA1C, LABURIC in the last 8760 hours.  Objective:  VS:  HT:5\' 10"  (177.8 cm)   WT:200 lb (90.7 kg)  BMI:28.7    BP:(!) 141/88  HR:(!) 59bpm  TEMP:98.6 F (37 C)(Oral)  RESP:96 % Physical Exam  Constitutional: He is oriented to person, place, and time. He appears well-developed and well-nourished. No distress.  HENT:  Head: Normocephalic and atraumatic.  Nose: Nose normal.  Mouth/Throat: Oropharynx is clear and moist.  Eyes: Pupils are equal, round, and reactive to light. Conjunctivae are normal.  Neck: Normal range of motion. Neck supple. No tracheal deviation present.  Cardiovascular: Normal rate, regular rhythm and intact distal pulses.  Pulmonary/Chest: Effort normal and breath sounds normal.  Abdominal: Soft. He exhibits no distension. There  is no rebound and no guarding.  Musculoskeletal: He exhibits no deformity.  Patient has forward flexed cervical spine with some decreased range of motion with rotation.  He has a negative Spurling's test.  He does have positive trigger points in the trapezius and levator scapula.  Some pain along the inferior angle of the scapula on the right which does reproduce some of his pain.  It  does not feel to be in particular lead big trigger point but some tenderness along the rib margin.  When palpating along the rib margin and the muscles he gets some referral pattern that seems to be myofascial which will refer down into the hip.  And this is with lateral palpation of the rib margin and muscle.  In terms of his anterior chest wall he still has this pain that is somewhat tender again along the rib margin not really costochondritic pain.  He has good strength in the upper extremities bilaterally.  Is a negative Hoffman's test bilaterally.  Neurological: He is alert and oriented to person, place, and time. He exhibits normal muscle tone. Coordination normal.  To me the patient does not have any cogwheeling.  He does have somewhat of a flat affect.  He does have what appears to be bilateral essential tremor.  He has a negative pronator drift.  Sensation is intact.  Skin: Skin is warm. No rash noted.  Psychiatric: He has a normal mood and affect. His behavior is normal.  Nursing note and vitals reviewed.   Ortho Exam Imaging: Xr C-arm No Report  Result Date: 03/26/2018 Please see Notes tab for imaging impression.   Past Medical/Family/Surgical/Social History: Medications & Allergies reviewed per EMR, new medications updated. Patient Active Problem List   Diagnosis Date Noted  . Right-sided chest wall pain 03/27/2018  . Pain of right scapula 03/27/2018  . Spinal stenosis of cervical region 03/27/2018  . Knee pain, bilateral 12/18/2016  . Lumbar degenerative disc disease 12/18/2016  . OA  (osteoarthritis) of knee 04/02/2016  . Synovitis of knee 12/08/2014  . Peripheral neuropathy 11/11/2013  . Chronic anxiety 10/26/2013  . Plantar fasciitis of left foot 01/01/2013  . Osteoarthritis of right knee 01/01/2013  . Tear, knee, lateral collateral ligament 01/01/2013  . INSOMNIA, CHRONIC 10/17/2010  . FATIGUE 10/17/2010  . Tremor, essential 07/20/2010  . Hyperlipidemia 03/03/2010  . Essential hypertension 03/03/2010  . ADENOCARCINOMA, PROSTATE, HX OF 03/03/2010  . NEPHROLITHIASIS, HX OF 03/03/2010   Past Medical History:  Diagnosis Date  . Anxiety   . Benign essential tremor    hands  . Complication of anesthesia 1999   spinal with last knee replacement  . Diverticulitis    last Saturday 03/24/16 -tx with antibiotics  . GERD (gastroesophageal reflux disease)   . H/O balanitis    recurrent intermittant-- uses diprolene cream prn  . History of colon polyps   . History of kidney stones   . History of prostate cancer urologist-  dr Alinda Money-  currently PSA nondetectable 10/ 2016   s/p  radial prostatectomy w/ nerve sparing 07-17-2007/   stage T1c,  Gleason 3+3=6,  PSA 6.33  . Hyperlipidemia   . Hypertension   . Mild obstructive sleep apnea    uses mouth guard only  . Nocturia   . Osteoarthritis    knees  . Sigmoid diverticulosis   . Synovial hypertrophy of left knee   . Urge urinary incontinence    Family History  Problem Relation Age of Onset  . Sudden death Mother   . Aneurysm Father   . Heart attack Unknown        grandfather   Past Surgical History:  Procedure Laterality Date  . BACK SURGERY     L4 surgery  . CARDIOVASCULAR STRESS TEST  08-04-2014   normal perfusion nuclear study/  normal LV function and wall motion , ef 54%  . COLONOSCOPY W/ POLYPECTOMY  06-21-2014  .  JOINT REPLACEMENT    . KNEE ARTHROSCOPY Bilateral right 07-31-2009//  left   . KNEE ARTHROSCOPY Left 12/08/2014   Procedure: ARTHROSCOPY LEFT KNEE WITH SYNOVECTOMY;  Surgeon: Gaynelle Arabian, MD;  Location: [facility];  Service: Orthopedics;  Laterality: Left;  . KNEE ARTHROSCOPY Left 02/14/2016   Procedure: ARTHROSCOPY KNEE WTH SYNOVECTOMY;  Surgeon: Gaynelle Arabian, MD;  Location[facility];  Service: Orthopedics;  Laterality: Left;  . LUMBAR DISC SURGERY  04-13-2002   left  L4 -- L5  . ROBOT ASSISTED LAPAROSCOPIC RADICAL PROSTATECTOMY  07-17-2007  . TONSILLECTOMY  as child  . TOTAL KNEE ARTHROPLASTY Left 03-08-2008  . TOTAL KNEE ARTHROPLASTY Right 04/02/2016   Procedure: RIGHT TOTAL KNEE ARTHROPLASTY;  Surgeon: Gaynelle Arabian, MD;  Location: [facility];  Service: Orthopedics;  Laterality: Right;   Social History   Occupational History  . 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 Smoking status: Never Smoker  . Smokeless tobacco: Never Used  Substance and Sexual Activity  . Alcohol use: Yes    Comment: social  . Drug use: No  . Sexual activity: Not on file

## 2018-03-27 NOTE — Patient Instructions (Signed)

## 2018-03-27 NOTE — Procedures (Signed)
Fluoroscopically guided muscle tendon injection of the anterior chest wall  Patient: Micheal [name]      Date of Birth: 28-Oct-1945 MRN: [id number] PCP: [name], MD      Visit Date: 03/26/2018   Universal Protocol:    Date/Time: 07/11/196:25 AM  Consent Given By: the patient  Position: supine  Additional Comments: Vital signs were monitored before and after the procedure. Patient was prepped and draped in the usual sterile fashion. The correct patient, procedure, and site was verified.   Injection Procedure Details:  Procedure Site One Meds Administered:  Meds ordered this encounter  Medications  . gabapentin (NEURONTIN) 600 MG tablet    Sig: Take 1 tablet (600 mg total) by mouth 3 (three) times daily. Slow taper off as directed.    Dispense:  120 tablet    Refill:  1  . methylPREDNISolone acetate (DEPO-MEDROL) injection 40 mg     Laterality: Right  Location/Site: Anterior chest wall along the rib margin of approximately the 11th rib in the area of tenderness and his typical pain pattern   Needle size: 22 G  Needle type: spinal needle  Needle Placement: along rib margin   Findings:   -Comments: Excellent flow contrast not showing any vascular flow and showing this to be located on or about the rib with good endpoint feel of the needle.  Procedure Details: Area of significant pain was palpated along the anterior medial rib margin of the chest wall.  After this location was marked and the patient was prepped in sterile fashion fluoroscopic guidance was utilized to introduce a 27-gauge needle for local anesthetic which was 1% lidocaine without epinephrine.  A 22-gauge spinal needle was then introduced in the same area and with fluoroscopic guidance was positioned along the rib closest to this point.  After confirmation of negative blood and air aspiration, the above solution was injected freely without resistance.   Additional Comments:  The patient  tolerated the procedure well No complications occurred Dressing: Band-Aid    Post-procedure details: Patient was observed during the procedure. Post-procedure instructions were reviewed.  Patient left the clinic in stable condition.

## 2018-03-27 NOTE — Progress Notes (Signed)
Micheal [name] - 72 y.o. male MRN [id number]  Date of birth: 07-20-1946  Office Visit Note: Visit Date: 02/04/2018 PCP: Micheal Post, MD Referred by: Micheal Post, MD  Subjective: Chief Complaint  Patient presents with  . Middle Back - Pain  . Right Shoulder - Pain  . Right Arm - Pain  . Chest - Pain  . Neck - Pain   HPI: Micheal [name] is a 72 year old gentleman who comes in today really is a self-referral.  He is friends with Micheal [name] whom I know and have treated his wife.  Micheal [name] comes in today with multiple complaints which may or may not be related but include history of chronic neck pain with some referral in the right arm and right scapula.  He had some issues with tingling in the arm as well.  His biggest complaint is the right inferior angle scapular pain or rib pain which is in the thoracic area which is been ongoing since August 2018.  He has been treated for this by his primary care physician Micheal [name] as well as Micheal [name] his neurosurgeon.  Patient does have a history of lumbar discectomy in 2003 with history of some foot drop.  Because of his neck and scapular pain MRI of the cervical spine was reviewed by Micheal [name] who it sounds like felt that that was not really the source of the pain in his scapula because the scapular pain progressed into having anterior chest wall pain.  His arm pain and tingling got better and while Micheal [name] wanted to possibly do an ACDF because of the stenosis found on the MRI they decided not to pursue surgery at that time.  He had a work-up for cardiac issues which was negative.  He does have hypertension.  They also had work-up for GI problems such as gallbladder etc. and this was negative as well.  So now he continues to have this mainly right inferior angle or scapular pain but also this anterior chest wall pain which is medial to the nipple line and lateral to the sternum.  It seems to be over 1 of the ribs with some tenderness to  palpation.  He does not report any specific time that it is painful in terms of motion or standing or twisting.  He describes this as a burning type pain pretty localized.  He does report some wraparound that he feels may be from the back to the front.  He had physical therapy in January of this year without dry needling.  He reports again pain really with standing sitting some may be pain with extending the right arm.  He does get some relief laying down.  It does interfere with what he would like to be doing.  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  He has not had any focal weakness other than the history of foot drop on the left.  He has had MRI of the thoracic and cervical spine.  Thoracic spine was fairly normal for age and cervical spine does show pretty significant stenosis.  These are reviewed and detailed below.  We did go over the pictures with him today.  He has had no interventions such as injections etc.  He has tried anti-inflammatories as well as tramadol and gabapentin without much relief.  He is on 600 mg of gabapentin  3 times a day and tolerating that well.  There was a time where he sort of went off of it on his own because he was having difficulty doing it 3 times a day he really felt like at the time he was not sure it was helping but then when he was off of it he felt like maybe it was helping.  He continues to stay on the gabapentin at this point.   Review of Systems  Constitutional: Negative for chills, fever, malaise/fatigue and weight loss.  HENT: Negative for hearing loss and sinus pain.   Eyes: Negative for blurred vision, double vision and photophobia.  Respiratory: Negative for cough and shortness of breath.   Cardiovascular: Positive for chest pain. Negative for palpitations and leg swelling.  Gastrointestinal: Negative for abdominal pain, nausea and vomiting.    Genitourinary: Negative for flank pain.  Musculoskeletal: Positive for back pain and neck pain. Negative for myalgias.  Skin: Negative for itching and rash.  Neurological: Positive for tremors and headaches. Negative for focal weakness and weakness.  Endo/Heme/Allergies: Negative.   Psychiatric/Behavioral: Negative for depression.  All other systems reviewed and are negative.  Otherwise per HPI.  Assessment & Plan: Visit Diagnoses:  1. Cervical radiculopathy   2. Spinal stenosis of cervical region   3. Cervical spondylosis without myelopathy   4. Cervicalgia   5. Pain of right scapula   6. Right-sided chest wall pain     Plan: Findings:  Interesting and really not classic pain distribution for any specific cause.  He is having chronic right thoracic/scapular pain at the inferior angle on the right which is been ongoing since August 2018 with no inciting injury.  He has had negative work-up in terms of thoracic spine MRI as well as GI work-up for things like gallbladder as well as negative work-up for cardiac issues.  He has seen his neurosurgeon Micheal [name] who felt like his cervical spine stenosis was not causing the symptoms that he was having in the scapula or anterior chest.  The symptoms did progress to now having this anterior burning chest pain in one localized area.  On exam there is some tenderness there but no focal trigger point it is over seemingly a rib in the front.  He does relate the 2 years being somewhat worse in the front when it is worse in the back with some radiating type symptoms but not classic neuritis.  Differential diagnosis is still pain related from the stenosis of the cervical spine because I think this could result and may be a C5 radiculopathy radiculitis which could give him some scapular pain.  I think he does have some underlying myofascial pain syndrome with a history of anxiety.  He could also be having intercostal neuritis but really the cause would be  unknown although it can just occur.  I think the first step is to complete a cervical epidural injection one time just to see if there is any chance the stenosis is the source of a lot of this pain.  If he gets tremendously better than obviously we could just do the injection and watch and that could be done intermittently or may be a surgical candidate at that point.  If it does not seem to help then we would regroup with either looking at trigger point injection locally of the thoracic scapular area or possibly looking at intercostal nerve block.  We may want to regroup with a different physical therapist at some point.  Otherwise  medications will stay the same.  I want him to continue the gabapentin at this point since he is tolerating a fairly good dose.    Meds & Orders: No orders of the defined types were placed in this encounter.  No orders of the defined types were placed in this encounter.   Follow-up: Return for Cervical epidural injection.   Procedures: No procedures performed  No notes on file   Clinical History: MRI THORACIC SPINE WITHOUT CONTRAST  TECHNIQUE: Multiplanar, multisequence MR imaging of the thoracic spine was performed. No intravenous contrast was administered.  COMPARISON:  Chest radiography 09/11/2017.  FINDINGS: Alignment:  Normal  Vertebrae: No fracture or primary bone lesion.  Cord:  No cord compression or primary cord lesion.  Paraspinal and other soft tissues: Normal  Disc levels:  Cervical scout study shows degenerative spondylosis at C4-5, C5-6 and C6-7.  T1-2: Normal.  T2-3: Normal.  T3-4: Shallow left-sided disc bulge. No stenosis or neural compression.  T4-5: Shallow left-sided disc bulge. No stenosis or neural compression.  T5-6: Endplate osteophytes and bulging of the disc. Narrowing of the ventral subarachnoid space but no compressive effect upon the canal or foramina.  T6-7: Left-sided disc bulge.  No stenosis or  neural compression.  T7-8: Mild bulging of the disc.  No stenosis or neural compression.  T8-9: Mild bulging of the disc.  No stenosis or neural compression.  T9-10: Normal interspace.  T10-11: Left paracentral disc bulge. No stenosis or neural compression.  T11-12: Normal interspace.  T12-L1: Normal interspace.  No significant facet arthropathy.  IMPRESSION: No cause of the presenting symptoms is identified. Mild degenerative changes of the spine for a person of this age. No advanced disease, neural compressive findings or active bone or joint pathology seen.   Electronically Signed   By: Nelson Chimes M.D.   On: 09/25/2017 08:40  MRI CERVICAL SPINE WITHOUT CONTRAST  TECHNIQUE: Multiplanar, multisequence MR imaging of the cervical spine was performed. No intravenous contrast was administered.  COMPARISON: None.  FINDINGS: Alignment: Physiologic.  Vertebrae: No fracture, evidence of discitis, or bone lesion.  Cord: Normal signal.  Posterior Fossa, vertebral arteries, paraspinal tissues: Negative.  Disc levels:  C2-3: Minimal posterior disc bulging. No impingement  C3-4: Disc narrowing with posterior disc osteophyte complex and uncovertebral ridging. Mild bilateral foraminal narrowing, with patency best depicted on axial T1 weighted imaging. Partial ventral subarachnoid space effacement. No cord compression  C4-5: Spondylosis and degenerative disc narrowing with left more than right uncovertebral ridging. Mild left foraminal narrowing. Ventral subarachnoid space effacement without cord compression.  C5-6: Degenerative disc narrowing with right more than left uncovertebral ridging. Posterior disc osteophyte complex and ligamentum flavum thickening causes spinal stenosis with ventral cord flattening. Right foraminal stenosis is moderate.  C6-7: Disc degeneration with left preferential posterior disc osteophyte complex. Left more than  right uncovertebral ridging. Moderate left foraminal narrowing. Spinal stenosis with mild ventral cord flattening greater towards the left. No cord compression.  C7-T1:Facet spurring. No impingement  IMPRESSION: 1. Disc degeneration most notable at C5-6 and C6-7 where there is spinal stenosis causing ventral cord flattening. 2. Uncovertebral ridging causes moderate foraminal on the right at C5-6 and left at C6-7.   Electronically Signed By: Monte Fantasia M.D. On: 08/28/2017 14:28   He reports that he has never smoked. He has never used smokeless tobacco. No results for input(s): HGBA1C, LABURIC in the last 8760 hours.  Objective:  VS:  HT:6\' 1"  (185.4 cm)   WT:203 lb (92.1 kg)  BMI:26.79    BP:129/76  HR:61bpm  TEMP:98.7 F (37.1 C)(Oral)  RESP:96 % Physical Exam  Constitutional: He is oriented to person, place, and time. He appears well-developed and well-nourished. No distress.  HENT:  Head: Normocephalic and atraumatic.  Nose: Nose normal.  Mouth/Throat: Oropharynx is clear and moist.  Eyes: Pupils are equal, round, and reactive to light. Conjunctivae are normal.  Neck: Normal range of motion. Neck supple. No tracheal deviation present.  Cardiovascular: Normal rate, regular rhythm and intact distal pulses.  Pulmonary/Chest: Effort normal and breath sounds normal. He exhibits tenderness.  Abdominal: Soft. He exhibits no distension. There is no rebound and no guarding.  Musculoskeletal: He exhibits no deformity.  Examination of the cervical spine shows forward flexed cervical spine with some decreased range of motion both right and left.  He has some focal trigger points in the levator scapula and rhomboid region and he does have some pain along the inferior angle of the scapula.  He has no winging.  He has good upper extremity strength without deficit.  He does have bilateral essential tremor.  He has a negative Hoffman's test bilaterally.  In terms of his anterior chest  wall there really is not signs of costochondritis although he is tender in the S1 area when you palpate along the rib margin and the musculature in that area.  There is no deformation or no prior appearing injury to the ribs here.  Neurological: He is alert and oriented to person, place, and time. He exhibits normal muscle tone. Coordination normal.  Skin: Skin is warm. No rash noted.  Psychiatric: He has a normal mood and affect. His behavior is normal.  Nursing note and vitals reviewed.   Ortho Exam Imaging: Xr C-arm No Report  Result Date: 03/26/2018 Please see Notes tab for imaging impression.   Past Medical/Family/Surgical/Social History: Medications & Allergies reviewed per EMR, new medications updated. Patient Active Problem List   Diagnosis Date Noted  . Right-sided chest wall pain 03/27/2018  . Pain of right scapula 03/27/2018  . Spinal stenosis of cervical region 03/27/2018  . Knee pain, bilateral 12/18/2016  . Lumbar degenerative disc disease 12/18/2016  . OA (osteoarthritis) of knee 04/02/2016  . Synovitis of knee 12/08/2014  . Peripheral neuropathy 11/11/2013  . Chronic anxiety 10/26/2013  . Plantar fasciitis of left foot 01/01/2013  . Osteoarthritis of right knee 01/01/2013  . Tear, knee, lateral collateral ligament 01/01/2013  . INSOMNIA, CHRONIC 10/17/2010  . FATIGUE 10/17/2010  . Tremor, essential 07/20/2010  . Hyperlipidemia 03/03/2010  . Essential hypertension 03/03/2010  . ADENOCARCINOMA, PROSTATE, HX OF 03/03/2010  . NEPHROLITHIASIS, HX OF 03/03/2010   Past Medical History:  Diagnosis Date  . Anxiety   . Benign essential tremor    hands  . Complication of anesthesia 1999   spinal with last knee replacement  . Diverticulitis    last Saturday 03/24/16 -tx with antibiotics  . GERD (gastroesophageal reflux disease)   . H/O balanitis    recurrent intermittant-- uses diprolene cream prn  . History of colon polyps   . History of kidney stones   .  History of prostate cancer urologist-  dr Alinda Money-  currently PSA nondetectable 10/ 2016   s/p  radial prostatectomy w/ nerve sparing 07-17-2007/   stage T1c,  Gleason 3+3=6,  PSA 6.33  . Hyperlipidemia   . Hypertension   . Mild obstructive sleep apnea    uses mouth guard only  . Nocturia   . Osteoarthritis  knees  . Sigmoid diverticulosis   . Synovial hypertrophy of left knee   . Urge urinary incontinence    Family History  Problem Relation Age of Onset  . Sudden death Mother   . Aneurysm Father   . Heart attack Unknown        grandfather   Past Surgical History:  Procedure Laterality Date  . BACK SURGERY     L4 surgery  . CARDIOVASCULAR STRESS TEST  08-04-2014   normal perfusion nuclear study/  normal LV function and wall motion , ef 54%  . COLONOSCOPY W/ POLYPECTOMY  06-21-2014  . JOINT REPLACEMENT    . KNEE ARTHROSCOPY Bilateral right 07-31-2009//  left   . KNEE ARTHROSCOPY Left 12/08/2014   Procedure: ARTHROSCOPY LEFT KNEE WITH SYNOVECTOMY;  Surgeon: Gaynelle Arabian, MD;  Location[facility];  Service: Orthopedics;  Laterality: Left;  . KNEE ARTHROSCOPY Left 02/14/2016   Procedure: ARTHROSCOPY KNEE WTH SYNOVECTOMY;  Surgeon: Gaynelle Arabian, MD;  Location: [facility];  Service: Orthopedics;  Laterality: Left;  . LUMBAR DISC SURGERY  04-13-2002   left  L4 -- L5  . ROBOT ASSISTED LAPAROSCOPIC RADICAL PROSTATECTOMY  07-17-2007  . TONSILLECTOMY  as child  . TOTAL KNEE ARTHROPLASTY Left 03-08-2008  . TOTAL KNEE ARTHROPLASTY Right 04/02/2016   Procedure: RIGHT TOTAL KNEE ARTHROPLASTY;  Surgeon: Gaynelle Arabian, MD;  Location: [facility];  Service: Orthopedics;  Laterality: Right;   Social History   Occupational History  . 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 Smoking status: Never Smoker  . Smokeless tobacco: Never Used  Substance and Sexual Activity  . Alcohol use: Yes    Comment: social  .  Drug use: No  . Sexual activity: Not on file

## 2018-04-01 NOTE — Progress Notes (Signed)
Subjective:    Micheal [name] was seen in consultation in the movement disorder clinic at the request of Micheal Post, MD.  The evaluation is for tremor.  The patient is a 72 y.o. right handed male with a history of tremor.   Tremor has been going on for several years.  It involves both hands.  It is progressively getting worse.   He notices it most with using the hands, especially if trying to show someone something.  He will note it when his hands are on the car wheel when he is driving.  He notices it with fine motor movement.  He sometimes notes it at rest.  He feels a tremor on the inside.  There is no family hx of tremor.    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  04/01/18 update: Patient is seen today at the request of Micheal [name].  His notes are reviewed.  Patient was last seen in our office in 2015.  His diagnoses at that point time was essential tremor and peripheral neuropathy.  Complaints that bring him back today are tremor and balance difficulties.  Tremor remains an action tremor.  It involves both hands.  It is most bothersome with eating soup.  It does not involve the head.  There is no family history of tremor.  It is not noticed at rest.  In regards to gait instability, the patient states that he had another knee  replacement since last visit and that went well.  He also had back surgery about a year ago and that left him with L drop foot and that has improved a little with time.  Since august, 2018, he has been dealing with ruptured disc in the lower cervical region, for which he saw Dr. Arnoldo [name].  Surgery was recommended but pt was leery and wanted other options.  Therefore, he went to Micheal [name] for pain management to see if that would help.  He has had injections in the neck without relief.  Currently, he is c/o pain around the lowest rib on the right.  He feels off balance when making a change in direction when walking.  He has had 2 falls - one he was just kneeling down near a lake and went head first into the lake.  With the other fall, he stepped over a fence (low garden fence) and he was leaning on the weed eater and it gave and he fell.  It was troublesome getting up.  He also notes that when his feet are propped up at night, his right toes will wiggle.    His B12 back in 2015 was 359.  There is no M spike on his SPEP with immunofixation.  There were no monoclonal light chains on his urine protein electrophoresis.  RPR was negative.  No Known  Allergies  Current Outpatient Medications on File Prior to Visit  Medication Sig Dispense Refill  . acetaminophen (TYLENOL) 500 MG tablet Take 1,000 mg by mouth every 6 (six) hours as needed for mild pain.     Marland Kitchen aspirin EC 81 MG tablet Take 81 mg by mouth daily.    Marland Kitchen atorvastatin (LIPITOR) 20 MG tablet TAKE 1 TABLET BY MOUTH  DAILY AT 6 PM. 90 tablet 2  . gabapentin (NEURONTIN) 600 MG tablet Take 1 tablet (600 mg total) by mouth 3 (three) times daily. Slow taper off as directed. 120 tablet 1  . olmesartan (BENICAR) 20 MG tablet Take 1 tablet (20 mg total) by mouth daily. 90 tablet 0  . sertraline (ZOLOFT) 50 MG tablet Take 1 tablet (50 mg total) by mouth daily. 90 tablet 2   Current Facility-Administered Medications on File Prior to Visit  Medication Dose Route  Frequency Provider Last Rate Last Dose  . methylPREDNISolone acetate (DEPO-MEDROL) injection 40 mg  40 mg Other Once Micheal Sinning, MD        Past Medical History:  Diagnosis Date  . Anxiety   . Benign essential tremor    hands  . Complication of anesthesia 1999   spinal with last knee replacement  . Diverticulitis    last Saturday 03/24/16 -tx with antibiotics  . GERD (gastroesophageal reflux disease)   . H/O balanitis    recurrent intermittant-- uses diprolene cream prn  . History of colon polyps   . History of kidney stones   . History of prostate cancer urologist-  dr Micheal [name]-  currently PSA nondetectable 10/ 2016   s/p  radial prostatectomy w/ nerve sparing 07-17-2007/   stage T1c,  Gleason 3+3=6,  PSA 6.33  . Hyperlipidemia   . Hypertension   . Mild obstructive sleep apnea    uses mouth guard only  . Nocturia   . Osteoarthritis    knees  . Sigmoid diverticulosis   . Synovial hypertrophy of left knee   . Urge urinary incontinence     Past Surgical History:  Procedure Laterality Date  . BACK SURGERY     L4 surgery  . CARDIOVASCULAR STRESS TEST  08-04-2014   normal perfusion nuclear study/  normal LV function and wall motion , ef 54%  . COLONOSCOPY W/ POLYPECTOMY  06-21-2014  . JOINT REPLACEMENT    . KNEE ARTHROSCOPY Bilateral right 07-31-2009//  left   . KNEE ARTHROSCOPY Left 12/08/2014   Procedure: ARTHROSCOPY LEFT KNEE WITH SYNOVECTOMY;  Surgeon: Micheal Arabian, MD;  Location: [facility];  Service: Orthopedics;  Laterality: Left;  . KNEE ARTHROSCOPY Left 02/14/2016   Procedure: ARTHROSCOPY KNEE WTH SYNOVECTOMY;  Surgeon: Micheal Arabian, MD;  Location: [facility];  Service: Orthopedics;  Laterality: Left;  . LUMBAR DISC SURGERY  04-13-2002   left  L4 -- L5  . ROBOT ASSISTED LAPAROSCOPIC RADICAL PROSTATECTOMY  07-17-2007  . TONSILLECTOMY  as child  . TOTAL KNEE ARTHROPLASTY Left 03-08-2008  . TOTAL KNEE ARTHROPLASTY Right 04/02/2016     Procedure: RIGHT TOTAL KNEE ARTHROPLASTY;  Surgeon: Micheal Arabian, MD;  Location: [facility];  Service: Orthopedics;  Laterality: Right;    Social History   Socioeconomic History  . Marital status: Married    Spouse name: Not on file  . Number of children: Not on file  . Years of education: Not on file  . Highest education level: Not on file  Occupational History  . Occupation: retired    Fish farm manager: [employer]    Comment: sales  Social Needs  . Financial resource strain: Not on file  . Food insecurity:    Worry: Not on file    Inability: Not on file  . Transportation needs:    Medical: Not on file    Non-medical: Not on file  Tobacco Use  . Smoking status: Never Smoker  . Smokeless tobacco: Never Used  Substance and Sexual Activity  . Alcohol use: Yes    Comment: social  . Drug use: No  . Sexual activity: Not on file  Lifestyle  . Physical activity:    Days per week: Not on file    Minutes per session: Not on file  . Stress: Not on file  Relationships  . Social connections:    Talks on phone: Not on file    Gets together: Not on file    Attends religious service: Not on file    Active member of club or organization: Not on file    Attends meetings of clubs or organizations: Not on file    Relationship status: Not on file  . Intimate partner violence:    Fear of current or ex partner: Not on file    Emotionally abused: Not on file    Physically abused: Not on file    Forced sexual activity: Not on file  Other Topics Concern  . Not on file  Social History Narrative  . Not on file    Family Status  Relation Name Status  . Mother  Deceased       pancreatitis complications  . Father  Deceased       heart failure  . Child  Alive       x 2 - healthy  . Child  Deceased at age at birth  . Unknown  (Not Specified)    Review of Systems Review of Systems  Constitutional: Negative.   HENT: Negative.   Eyes: Negative.   Respiratory: Negative.    Cardiovascular: Negative.   Gastrointestinal: Negative.   Genitourinary: Negative.   Musculoskeletal: Positive for back pain.  Skin: Negative.   Neurological: Positive for tingling (around the knees bilaterally) and tremors.  Endo/Heme/Allergies: Negative.   Psychiatric/Behavioral: Negative.       Objective:   VITALS:   Vitals:   04/03/18 0952  BP: 110/84  Pulse: 61  SpO2: 95%  Weight: 203 lb 2 oz (92.1 kg)  Height: 5\' 10"  (1.778 m)    Gen:  Appears stated age and in NAD. HEENT:  Normocephalic, atraumatic. The mucous membranes are moist. The superficial temporal arteries are without ropiness or tenderness. Cardiovascular: Regular rate and rhythm. Lungs: Clear to auscultation bilaterally. Neck: There are no carotid bruits noted bilaterally.  NEUROLOGICAL:  Pt placed in examining shorts for exam:  Orientation:  The patient is alert and oriented x 3.  Recent and remote memory are intact.  Attention span and concentration are normal.  Able to name objects and repeat without trouble.  Fund of knowledge is appropriate Cranial nerves: There is good facial symmetry. The pupils are equal round and reactive to light bilaterally. Fundoscopic exam reveals clear disc margins bilaterally. Extraocular muscles are intact and visual fields are full to confrontational testing. Speech is fluent and clear. Soft palate rises symmetrically and there is no tongue deviation. Hearing is intact to conversational tone. Tone: Tone is good throughout. Sensation: Sensation is intact to light touch and pinprick throughout (facial, trunk, extremities).  Pinprick was decreased in a stocking distribution.  Vibration is markedly decreased in a distal fashion, including at the knees.  Proprioception was intact at the bilateral big toe.  There is no extinction with double simultaneous stimulation. There is no sensory dermatomal level identified. Coordination:  The patient has no difficulty with RAM's or FNF  bilaterally.  There is no decremation with rapid alternating movements. Motor: Strength is 5/5 in the bilateral upper and lower extremities.  Shoulder shrug is equal bilaterally.  There is no pronator drift.  There are no fasciculations noted. DTR's: Deep tendon reflexes are 2/4 at the bilateral biceps, triceps, brachioradialis, patella and trace at the bilateral achilles.  Plantar responses are downgoing bilaterally. Gait and Station: The patient is able to ambulate without difficulty.  He has good arm swing.  The patient initially has trouble ambulating in a tandem fashion, but the longer he tries it, the better he does.  He was able to stand in the Romberg position with eyes open and closed. Abnormal movements: There is no rest tremor.  There is postural tremor, left greater than right.  Which is overall mild.    LABS  Lab Results  Component Value Date   TSH 1.59 10/21/2013   Lab Results  Component Value Date   VITAMINB12 359 11/11/2013     Chemistry      Component Value Date/Time   NA 139 04/04/2016 0421   K 3.9 04/04/2016 0421   CL 106 04/04/2016 0421   CO2 29 04/04/2016 0421   BUN 14 04/04/2016 0421   CREATININE 0.80 04/04/2016 0421      Component Value Date/Time   CALCIUM 8.5 (L) 04/04/2016 0421   ALKPHOS 73 03/26/2016 1430   AST 29 03/26/2016 1430   ALT 19 03/26/2016 1430   BILITOT 0.7 03/26/2016 1430     Lab Results  Component Value Date   FOLATE 7.1 11/11/2013   No results found for: HGBA1C      Assessment/Plan:   1.  Essential Tremor.  - It is overall very mild.  He doesn't want treatment.  I don't disagree.    -asks me again if he has PD (asked me about this last visit in 2015) and reassured him that I didn't see any evidence of this.  Explained again that this is a clinical diagnosis and he does not meet any clinical criteria for this.  His diagnosis remains essential tremor. 2.  PN  -Continues to have strong evidence of this on examination and is likely  the etiology of balance issues, along with his back problems.  Lab work done several years ago demonstrated a mild B12 deficiency.  Will recheck that today.  -We will check EMG.  -We will have him consult with our neuromuscular specialist, Dr. Posey Pronto, just to make sure I am not missing anything else.  Safety discussed.

## 2018-04-03 ENCOUNTER — Other Ambulatory Visit: Payer: Medicare Other

## 2018-04-03 ENCOUNTER — Encounter: Payer: Self-pay | Admitting: Neurology

## 2018-04-03 ENCOUNTER — Ambulatory Visit (INDEPENDENT_AMBULATORY_CARE_PROVIDER_SITE_OTHER): Payer: Medicare Other | Admitting: Neurology

## 2018-04-03 VITALS — BP 110/84 | HR 61 | Ht 70.0 in | Wt 203.1 lb

## 2018-04-03 DIAGNOSIS — E538 Deficiency of other specified B group vitamins: Secondary | ICD-10-CM | POA: Diagnosis not present

## 2018-04-03 DIAGNOSIS — G250 Essential tremor: Secondary | ICD-10-CM

## 2018-04-03 DIAGNOSIS — G609 Hereditary and idiopathic neuropathy, unspecified: Secondary | ICD-10-CM

## 2018-04-03 NOTE — Patient Instructions (Signed)
1. We will schedule an EMG of both legs.   2. Your provider has requested that you have labwork completed today. Please go to [facility] ([id number]) on the second floor of this building before leaving the office today. You do not need to check in. If you are not called within 15 minutes please check with the front desk.

## 2018-04-04 ENCOUNTER — Telehealth: Payer: Self-pay | Admitting: Neurology

## 2018-04-04 LAB — VITAMIN B12: VITAMIN B 12: 484 pg/mL (ref 200–1100)

## 2018-04-04 NOTE — Telephone Encounter (Signed)
-----   Message from [place], DO sent at 04/04/2018  7:43 AM EDT ----- Let pt know that b12 is better

## 2018-04-04 NOTE — Telephone Encounter (Signed)
Mychart message sent to patient.

## 2018-04-09 ENCOUNTER — Encounter (INDEPENDENT_AMBULATORY_CARE_PROVIDER_SITE_OTHER): Payer: Self-pay | Admitting: Physical Medicine and Rehabilitation

## 2018-04-09 ENCOUNTER — Ambulatory Visit (INDEPENDENT_AMBULATORY_CARE_PROVIDER_SITE_OTHER): Payer: Medicare Other | Admitting: Physical Medicine and Rehabilitation

## 2018-04-09 VITALS — BP 135/75 | HR 57 | Temp 97.9°F | Ht 70.0 in | Wt 200.0 lb

## 2018-04-09 DIAGNOSIS — M7918 Myalgia, other site: Secondary | ICD-10-CM | POA: Diagnosis not present

## 2018-04-09 DIAGNOSIS — M25552 Pain in left hip: Secondary | ICD-10-CM

## 2018-04-09 DIAGNOSIS — G8929 Other chronic pain: Secondary | ICD-10-CM

## 2018-04-09 DIAGNOSIS — R0781 Pleurodynia: Secondary | ICD-10-CM | POA: Diagnosis not present

## 2018-04-09 DIAGNOSIS — M545 Low back pain, unspecified: Secondary | ICD-10-CM

## 2018-04-09 DIAGNOSIS — M546 Pain in thoracic spine: Secondary | ICD-10-CM

## 2018-04-09 MED ORDER — MELOXICAM 15 MG PO TABS: 15.0000 mg | ORAL_TABLET | Freq: Every day | ORAL | 0 refills | Status: DC

## 2018-04-09 NOTE — Progress Notes (Signed)
Pt states pain lower back mostly on the left side that radiates into left hip (no groin pain). Pt states pain started yesterday. Pt states pain standing and bending makes pain worse, laying down makes pain better.   .Numeric Pain Rating Scale and Functional Assessment Average Pain 8 Pain Right Now 8 My pain is intermittent, dull and aching Pain is worse with: bending, standing and some activites Pain improves with: rest   In the last MONTH (on 0-10 scale) has pain interfered with the following?  1. General activity like being  able to carry out your everyday physical activities such as walking, climbing stairs, carrying groceries, or moving a chair?  Rating(5)  2. Relation with others like being able to carry out your usual social activities and roles such as  activities at home, at work and in your community. Rating(4)  3. Enjoyment of life such that you have  been bothered by emotional problems such as feeling anxious, depressed or irritable?  Rating(0)

## 2018-04-17 DIAGNOSIS — C44311 Basal cell carcinoma of skin of nose: Secondary | ICD-10-CM | POA: Diagnosis not present

## 2018-04-21 ENCOUNTER — Encounter (INDEPENDENT_AMBULATORY_CARE_PROVIDER_SITE_OTHER): Payer: Self-pay | Admitting: Physical Medicine and Rehabilitation

## 2018-04-21 NOTE — Progress Notes (Signed)
Micheal [name] - 72 y.o. male MRN [id number]  Date of birth: May 16, 1946  Office Visit Note: Visit Date: 04/09/2018 PCP: [name], MD Referred by: [name], MD  Subjective: Chief Complaint  Patient presents with  . Lower Back - Pain  . Left Hip - Pain  . Middle Back - Pain   HPI: Micheal [name] is a very pleasant 72 year old gentleman that we have gotten over the last couple months.  He is friends with Dr. Deon Pilling.  He comes in today after having had  fluoroscopic guided injection over the lower right and anterior chest wall at the level of the rib margin.  This is more of a muscular injections/trigger point injection.  He reports less frequency of the pain he was having in the front start sternal area but he still was having pain in the back right inferior angle of the scapular pain which is been his biggest complaint.  He is somewhat difficult from a pain standpoint as he has multiple complaints the more we talked to him in the more we get to know some complaint claims he is having an anxiety level about just different symptoms.  We had referred him back to Dr. Wells Guiles Tat who he had seen in the past.  She again has confirmed that he does not have anything like Parkinson's but she did want him to see Dr. Narda Amber for electrodiagnostic studies of the lower extremities.  I think this is a wise choice since he carries some diagnosis of peripheral polyneuropathy but he also has had back surgery with some weakness initially after the back surgery.  Interestingly he comes in today really with more complaints of acute onset of left lower back pain that radiates into the left greater trochanteric and buttock area.  He says the pain started yesterday and it hurts with standing and bending but laying down makes his symptoms a lot better.  He does not endorse any specific inciting injury just acute onset of pain.  He has had no pain down the legs that is different or new.  He has had relatively  new MRI of the lumbar spine which is reviewed again below we reviewed this with him the first time we saw him but his lower back really was not an issue.  Chronically he has had lower back pain and back surgery.  He has not had recent physical therapy we have felt like some of his pain is been myofascial and we talked about that at length today.   Review of Systems  Constitutional: Negative for chills, fever, malaise/fatigue and weight loss.  HENT: Negative for hearing loss and sinus pain.   Eyes: Negative for blurred vision, double vision and photophobia.  Respiratory: Negative for cough and shortness of breath.   Cardiovascular: Negative for chest pain, palpitations and leg swelling.  Gastrointestinal: Negative for abdominal pain, nausea and vomiting.  Genitourinary: Negative for flank pain.  Musculoskeletal: Positive for back pain and joint pain. Negative for myalgias.  Skin: Negative for itching and rash.  Neurological: Positive for tingling and tremors. Negative for focal weakness and weakness.  Endo/Heme/Allergies: Negative.   Psychiatric/Behavioral: Negative for depression.  All other systems reviewed and are negative.  Otherwise per HPI.  Assessment & Plan: Visit Diagnoses:  1. Acute left-sided low back pain without sciatica   2. Pain in left hip   3. Chronic right-sided thoracic back pain   4. Rib pain   5. Myofascial pain syndrome  Plan: Findings:  Again chronic history of right thoracic pain at the inferior angle of the right scapula without scapular winging with positive trigger point palpation and negative findings on MRIs and other testing for any pathology of his heart or digestive issues.  He does have a history of anxiety which I think foster some of the focus on different complaints but clearly has had this thoracic pain for some time.  He feels like it has radiated to the front at times in the last time we saw him we actually completed an injection using fluoroscopic  guidance over the rib margin anteriorly and the rib cage.  He reports less frequency of the symptoms in the front with continued pain in the thoracic area.  I think at this point the next step would be regrouping with a physical therapist and/or [place] doing a trigger point in this inferior angle area.  From a pain standpoint because of the acute nature of his left lower back pain were not going to complete the trigger point injection in the scapular region today but we are going to start him on meloxicam to be taken daily for a couple of 3 weeks.  We have talked about stretching and movement for this left lower back pain which I think is a sprain strain type of issue.  He does have changes on lumbar MRI which are chronic.  Again in this flareup just started yesterday and is not been the main complaint.  He also has a lot going on with seeing Dr. Narda Amber in a few weeks for electrodiagnostic studies and hopefully that will shed some light on issues going on in the legs for him.  He continues to have essential tremor and this was reevaluated by Dr. Wells Guiles Tat.    Meds & Orders:  Meds ordered this encounter  Medications  . meloxicam (MOBIC) 15 MG tablet    Sig: Take 1 tablet (15 mg total) by mouth daily. Take with food    Dispense:  30 tablet    Refill:  0   No orders of the defined types were placed in this encounter.   Follow-up: No follow-ups on file.   Procedures: No procedures performed  No notes on file   Clinical History: MRI LUMBAR SPINE WITHOUT AND WITH CONTRAST   L2-L3: Disc desiccation and small bulge with severe bilateral facet hypertrophy resulting in moderate narrowing of the central spinal canal that has progressed slightly from the prior study. No neural foraminal stenosis.  L3-L4: Disc space narrowing and desiccation with prior laminotomy on the left. No spinal canal stenosis. Moderate right and severe left neural foraminal stenosis, unchanged.  L4-L5:  Postsurgical changes. Severe bilateral facet hypertrophy and small disc bulge. No central spinal canal stenosis. Bilateral lateral recess narrowing, unchanged. Left foraminal disc protrusion it is decreased in size. Mild left foraminal narrowing with small amount of granulation tissue.  L5-S1: Disc space narrowing and endplate spurring with small central disc osteophyte complex. No spinal canal stenosis. No neural foraminal stenosis.  Visualized sacrum: Normal.  IMPRESSION: 1. Status post partial left facetectomy at L4-L5 with improved patency of the neural foramen. Mild persistent foraminal narrowing secondary to small amount of granulation tissue. 2. Mild progression of disc and facet degeneration at L2-L3 with resulting moderate spinal canal stenosis. 3. Unchanged postsurgical appearance at L3-L4 with moderate right and severe left foraminal stenosis. 4. Multilevel severe facet arthrosis.   Electronically Signed   By: Ulyses Jarred M.D.   On:  11/18/2016 23:08 MRI THORACIC SPINE WITHOUT CONTRAST  TECHNIQUE: Multiplanar, multisequence MR imaging of the thoracic spine was performed. No intravenous contrast was administered.  COMPARISON: Chest radiography 09/11/2017.  FINDINGS: Alignment: Normal  Vertebrae: No fracture or primary bone lesion.  Cord: No cord compression or primary cord lesion.  Paraspinal and other soft tissues: Normal  Disc levels:  Cervical scout study shows degenerative spondylosis at C4-5, C5-6 and C6-7.  T1-2: Normal.  T2-3: Normal.  T3-4: Shallow left-sided disc bulge. No stenosis or neural compression.  T4-5: Shallow left-sided disc bulge. No stenosis or neural compression.  T5-6: Endplate osteophytes and bulging of the disc. Narrowing of the ventral subarachnoid space but no compressive effect upon the canal or foramina.  T6-7: Left-sided disc bulge. No stenosis or neural compression.  T7-8: Mild bulging of the  disc. No stenosis or neural compression.  T8-9: Mild bulging of the disc. No stenosis or neural compression.  T9-10: Normal interspace.  T10-11: Left paracentral disc bulge. No stenosis or neural compression.  T11-12: Normal interspace.  T12-L1: Normal interspace.  No significant facet arthropathy.  IMPRESSION: No cause of the presenting symptoms is identified. Mild degenerative changes of the spine for a person of this age. No advanced disease, neural compressive findings or active bone or joint pathology seen.   Electronically Signed By: Nelson Chimes M.D. On: 09/25/2017 08:40  MRI CERVICAL SPINE WITHOUT CONTRAST  TECHNIQUE: Multiplanar, multisequence MR imaging of the cervical spine was performed. No intravenous contrast was administered.  COMPARISON: None.  FINDINGS: Alignment: Physiologic.  Vertebrae: No fracture, evidence of discitis, or bone lesion.  Cord: Normal signal.  Posterior Fossa, vertebral arteries, paraspinal tissues: Negative.  Disc levels:  C2-3: Minimal posterior disc bulging. No impingement  C3-4: Disc narrowing with posterior disc osteophyte complex and uncovertebral ridging. Mild bilateral foraminal narrowing, with patency best depicted on axial T1 weighted imaging. Partial ventral subarachnoid space effacement. No cord compression  C4-5: Spondylosis and degenerative disc narrowing with left more than right uncovertebral ridging. Mild left foraminal narrowing. Ventral subarachnoid space effacement without cord compression.  C5-6: Degenerative disc narrowing with right more than left uncovertebral ridging. Posterior disc osteophyte complex and ligamentum flavum thickening causes spinal stenosis with ventral cord flattening. Right foraminal stenosis is moderate.  C6-7: Disc degeneration with left preferential posterior disc osteophyte complex. Left more than right uncovertebral ridging. Moderate left foraminal  narrowing. Spinal stenosis with mild ventral cord flattening greater towards the left. No cord compression.  C7-T1:Facet spurring. No impingement  IMPRESSION: 1. Disc degeneration most notable at C5-6 and C6-7 where there is spinal stenosis causing ventral cord flattening. 2. Uncovertebral ridging causes moderate foraminal on the right at C5-6 and left at C6-7.   Electronically Signed By: Monte Fantasia M.D. On: 08/28/2017 14:28   He reports that he has never smoked. He has never used smokeless tobacco. No results for input(s): HGBA1C, LABURIC in the last 8760 hours.  Objective:  VS:  HT:5\' 10"  (177.8 cm)   WT:200 lb (90.7 kg)  BMI:28.7    BP:135/75  HR:(!) 57bpm  TEMP:97.9 F (36.6 C)(Oral)  RESP:97 % Physical Exam  Constitutional: He is oriented to person, place, and time. He appears well-developed and well-nourished. No distress.  HENT:  Head: Normocephalic and atraumatic.  Nose: Nose normal.  Mouth/Throat: Oropharynx is clear and moist.  Eyes: Pupils are equal, round, and reactive to light. Conjunctivae are normal.  Neck: Normal range of motion. Neck supple. No tracheal deviation present.  Cardiovascular: Regular rhythm and intact distal  pulses.  Pulmonary/Chest: Effort normal and breath sounds normal.  Abdominal: Soft. He exhibits no distension. There is no rebound and no guarding.  Musculoskeletal: He exhibits no deformity.  Examination of the lumbar spine shows the patient with pain on extension rotation particularly left more than right.  He has tightness through the paraspinal region with some trigger point issues here somewhat reproductive of his pain on the left side.  No pain over the greater trochanteric areas bilaterally.  No pain with hip rotation should particular groin pain.  He has decent strength bilaterally both with dorsiflexion plantarflexion EHL.  Neurological: He is alert and oriented to person, place, and time. He exhibits normal muscle tone.  Coordination normal.  Skin: Skin is warm. No rash noted.  Psychiatric: He has a normal mood and affect. His behavior is normal.  Nursing note and vitals reviewed.   Ortho Exam Imaging: No results found.  Past Medical/Family/Surgical/Social History: Medications & Allergies reviewed per EMR, new medications updated. Patient Active Problem List   Diagnosis Date Noted  . Right-sided chest wall pain 03/27/2018  . Pain of right scapula 03/27/2018  . Spinal stenosis of cervical region 03/27/2018  . Knee pain, bilateral 12/18/2016  . Lumbar degenerative disc disease 12/18/2016  . OA (osteoarthritis) of knee 04/02/2016  . Synovitis of knee 12/08/2014  . Peripheral neuropathy 11/11/2013  . Chronic anxiety 10/26/2013  . Plantar fasciitis of left foot 01/01/2013  . Osteoarthritis of right knee 01/01/2013  . Tear, knee, lateral collateral ligament 01/01/2013  . INSOMNIA, CHRONIC 10/17/2010  . FATIGUE 10/17/2010  . Tremor, essential 07/20/2010  . Hyperlipidemia 03/03/2010  . Essential hypertension 03/03/2010  . ADENOCARCINOMA, PROSTATE, HX OF 03/03/2010  . NEPHROLITHIASIS, HX OF 03/03/2010   Past Medical History:  Diagnosis Date  . Anxiety   . Benign essential tremor    hands  . Complication of anesthesia 1999   spinal with last knee replacement  . Diverticulitis    last Saturday 03/24/16 -tx with antibiotics  . GERD (gastroesophageal reflux disease)   . H/O balanitis    recurrent intermittant-- uses diprolene cream prn  . History of colon polyps   . History of kidney stones   . History of prostate cancer urologist-  dr Alinda Money-  currently PSA nondetectable 10/ 2016   s/p  radial prostatectomy w/ nerve sparing 07-17-2007/   stage T1c,  Gleason 3+3=6,  PSA 6.33  . Hyperlipidemia   . Hypertension   . Mild obstructive sleep apnea    uses mouth guard only  . Nocturia   . Osteoarthritis    knees  . Sigmoid diverticulosis   . Synovial hypertrophy of left knee   . Urge urinary  incontinence    Family History  Problem Relation Age of Onset  . Sudden death Mother   . Aneurysm Father   . Heart attack Unknown        grandfather   Past Surgical History:  Procedure Laterality Date  . BACK SURGERY     L4 surgery  . CARDIOVASCULAR STRESS TEST  08-04-2014   normal perfusion nuclear study/  normal LV function and wall motion , ef 54%  . COLONOSCOPY W/ POLYPECTOMY  06-21-2014  . JOINT REPLACEMENT    . KNEE ARTHROSCOPY Bilateral right 07-31-2009//  left   . KNEE ARTHROSCOPY Left 12/08/2014   Procedure: ARTHROSCOPY LEFT KNEE WITH SYNOVECTOMY;  Surgeon: Gaynelle Arabian, MD;  Location: [facility];  Service: Orthopedics;  Laterality: Left;  . KNEE ARTHROSCOPY Left 02/14/2016  Procedure: ARTHROSCOPY KNEE WTH SYNOVECTOMY;  Surgeon: Gaynelle Arabian, MD;  Location: [facility];  Service: Orthopedics;  Laterality: Left;  . LUMBAR DISC SURGERY  04-13-2002   left  L4 -- L5  . ROBOT ASSISTED LAPAROSCOPIC RADICAL PROSTATECTOMY  07-17-2007  . TONSILLECTOMY  as child  . TOTAL KNEE ARTHROPLASTY Left 03-08-2008  . TOTAL KNEE ARTHROPLASTY Right 04/02/2016   Procedure: RIGHT TOTAL KNEE ARTHROPLASTY;  Surgeon: Gaynelle Arabian, MD;  Location: [facility];  Service: Orthopedics;  Laterality: Right;   Social History   Occupational History  . 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 Smoking status: Never Smoker  . Smokeless tobacco: Never Used  Substance and Sexual Activity  . Alcohol use: Yes    Comment: social  . Drug use: No  . Sexual activity: Not on file

## 2018-05-01 ENCOUNTER — Telehealth: Payer: Self-pay | Admitting: Neurology

## 2018-05-01 ENCOUNTER — Ambulatory Visit (INDEPENDENT_AMBULATORY_CARE_PROVIDER_SITE_OTHER): Payer: Medicare Other | Admitting: Neurology

## 2018-05-01 DIAGNOSIS — G609 Hereditary and idiopathic neuropathy, unspecified: Secondary | ICD-10-CM

## 2018-05-01 NOTE — Procedures (Signed)
[facility]  [address], [place]  [place], [place] [address] Tel: [phone] Fax:  [phone] Test Date:  05/01/2018  Patient: Micheal [name] DOB: Jun 02, 1946 Physician: Narda Amber, DO  Sex: Male Height: 5\' 10"  Ref Phys: Alonza Bogus, DO  ID#: [id number] Temp: 35.3C Technician:    Patient Complaints: This is a 72 year-old man with history of lumbar surgery referred for evaluation of gait imbalance.  NCV & EMG Findings: Extensive electrodiagnostic testing of the right lower extremity and additional studies of the left shows:  1. Bilateral sural and superficial peroneal sensory responses are within normal limits. 2. Left peroneal motor response shows reduced amplitude at the extensor digitorum brevis, and is asymmetrically reduced at the tibialis anterior when compared to the right. Bilateral tibial and right peroneal motor responses are within normal limits. 3. Bilateral tibial H reflex studies are within normal limits. 4. Chronic motor axon loss changes are seen affecting the L5 myotome on the left, without accompanied active denervation.  Impression: 1. 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 2. There is no evidence of a large fiber sensorimotor polyneuropathy affecting the lower extremities.    ___________________________ Narda Amber, DO    Nerve Conduction Studies Anti Sensory Summary Table   Site NR Peak (ms) Norm Peak (ms) P-T Amp (V) Norm P-T Amp  Left Sup Peroneal Anti Sensory (Ant Lat Mall)  35.3C  12 cm    4.1 <4.6 6.3 >3  Right Sup Peroneal Anti Sensory (Ant Lat Mall)  35.3C  12 cm    3.5 <4.6 7.3 >3  Left Sural Anti Sensory (Lat Mall)  35.3C  Calf    3.7 <4.6 9.6 >3  Right Sural Anti Sensory (Lat Mall)  35.3C  Calf    3.5 <4.6 7.1 >3   Motor Summary Table   Site NR Onset (ms) Norm Onset (ms) O-P Amp (mV) Norm O-P Amp Site1 Site2 Delta-0 (ms) Dist (cm) Vel (m/s) Norm Vel (m/s)    Left Peroneal Motor (Ext Dig Brev)  35.3C  Ankle    5.2 <6.0 1.1 >2.5 B Fib Ankle 8.6 39.0 45 >40  B Fib    13.8  0.9  Poplt B Fib 1.7 9.0 53 >40  Poplt    15.5  0.8         Right Peroneal Motor (Ext Dig Brev)  35.3C  Ankle    3.5 <6.0 3.5 >2.5 B Fib Ankle 8.8 37.0 42 >40  B Fib    12.3  3.2  Poplt B Fib 1.6 10.0 63 >40  Poplt    13.9  3.1         Left Peroneal TA Motor (Tib Ant)  35.3C  Fib Head    3.0 <4.5 4.3 >3 Poplit Fib Head 1.5 9.0 60 >40  Poplit    4.5  4.2         Right Peroneal TA Motor (Tib Ant)  35.3C  Fib Head    2.3 <4.5 5.9 >3 Poplit Fib Head 1.4 9.0 64 >40  Poplit    3.7  5.8         Left Tibial Motor (Abd Hall Brev)  35.3C  Ankle    4.3 <6.0 7.6 >4 Knee Ankle 8.2 43.0 52 >40  Knee    12.5  6.1         Right Tibial Motor (Abd Hall Brev)  35.3C  Ankle    4.3 <6.0 7.7 >4 Knee Ankle  10.4 43.0 41 >40  Knee    14.7  5.3          H Reflex Studies   NR H-Lat (ms) Lat Norm (ms) L-R H-Lat (ms)  Left Tibial (Gastroc)  35.3C     34.29 <35 0.00  Right Tibial (Gastroc)  35.3C     34.29 <35 0.00   EMG   Side Muscle Ins Act Fibs Psw Fasc Number Recrt Dur Dur. Amp Amp. Poly Poly. Comment  Right AntTibialis Nml Nml Nml Nml Nml Nml Nml Nml Nml Nml Nml Nml N/A  Right Gastroc Nml Nml Nml Nml Nml Nml Nml Nml Nml Nml Nml Nml N/A  Right Flex Dig Long Nml Nml Nml Nml Nml Nml Nml Nml Nml Nml Nml Nml N/A  Right RectFemoris Nml Nml Nml Nml Nml Nml Nml Nml Nml Nml Nml Nml N/A  Right GluteusMed Nml Nml Nml Nml Nml Nml Nml Nml Nml Nml Nml Nml N/A  Left BicepsFemS Nml Nml Nml Nml Nml Nml Nml Nml Nml Nml Nml Nml N/A  Right BicepsFemS Nml Nml Nml Nml Nml Nml Nml Nml Nml Nml Nml Nml N/A  Left AntTibialis Nml Nml Nml Nml 1- Rapid Some 1+ Some 1+ Some 1+ N/A  Left Gastroc Nml Nml Nml Nml Nml Nml Nml Nml Nml Nml Nml Nml N/A  Left Flex Dig Long Nml Nml Nml Nml 2- Rapid Some 1+ Some 1+ Some 1+ N/A  Left RectFemoris Nml Nml Nml Nml Nml Nml Nml Nml Nml Nml Nml Nml N/A  Left GluteusMed Nml  Nml Nml Nml 1- Rapid Some 1+ Some 1+ Some 1+ N/A      Waveforms:

## 2018-05-01 NOTE — Telephone Encounter (Signed)
Left message on machine for patient to call back.

## 2018-05-01 NOTE — Telephone Encounter (Signed)
-----   Message from [place], DO sent at 05/01/2018 12:32 PM EDT ----- Micheal [name], please let pt know results (chronic pinched nerve from back to left leg).  No evidence of large fiber PN.  Will consult with Dr. Posey Pronto in future

## 2018-05-01 NOTE — Telephone Encounter (Signed)
Patient made aware of results. He wants to think about it and call back for an appt with Dr. Posey Pronto.

## 2018-05-01 NOTE — Progress Notes (Signed)
If you don't mind.  He is a nice guy, I just don't have any answers

## 2018-05-09 ENCOUNTER — Encounter (INDEPENDENT_AMBULATORY_CARE_PROVIDER_SITE_OTHER): Payer: Self-pay | Admitting: Physical Medicine and Rehabilitation

## 2018-06-03 ENCOUNTER — Telehealth: Payer: Self-pay | Admitting: Family Medicine

## 2018-06-03 MED ORDER — SERTRALINE HCL 50 MG PO TABS: 50.0000 mg | ORAL_TABLET | Freq: Every day | ORAL | 0 refills | Status: DC

## 2018-06-03 MED ORDER — ATORVASTATIN CALCIUM 20 MG PO TABS: ORAL_TABLET | ORAL | 0 refills | Status: DC

## 2018-06-03 NOTE — Telephone Encounter (Signed)
Copied from [place] [phone]. Topic: Quick Communication - Rx Refill/Question >> Jun 03, 2018 10:35 AM Judyann Munson wrote: Medication: sertraline (ZOLOFT) 50 MG tablet, atorvastatin (LIPITOR) 20 MG tablet  Has the patient contacted their pharmacy? No   Preferred Pharmacy (with phone number or street name): [place], [place] [phone] (Phone) [phone] (Fax)    Agent: Please be advised that RX refills may take up to 3 business days. We ask that you follow-up with your pharmacy.

## 2018-06-09 DIAGNOSIS — Z23 Encounter for immunization: Secondary | ICD-10-CM | POA: Diagnosis not present

## 2018-07-22 ENCOUNTER — Other Ambulatory Visit: Payer: Self-pay | Admitting: Family Medicine

## 2018-07-22 ENCOUNTER — Other Ambulatory Visit: Payer: Self-pay | Admitting: Internal Medicine

## 2018-07-22 NOTE — Telephone Encounter (Signed)
Dr.Burchette's Pt

## 2018-07-25 ENCOUNTER — Ambulatory Visit: Payer: Medicare Other

## 2018-07-28 NOTE — Progress Notes (Addendum)
Subjective:   Micheal [name] is a 72 y.o. male who presents for Medicare Annual/Subsequent preventive examination.  Reports health as good   2 dtr  3 granddtrs;  One lives in [place] and [place]  6 br and 4 baths;  2 bedroom and 2 br on the main floor  Not planning to scale down in the immediate future   Diet  Chol 4/ Health diet Do not eat a lot between meals Likes carbs; bread  Sandwich at [employer]; salads and green vegetables Wife is DM  Decided to lose weight recently  When he gains, he cuts back   BMI 29 BMI 27   Exercise Loves to fish;  Walks a lot; fish 5 days a week 2 to 5 miles per day Fishes at the [employer] the dog   Exercise 04/02/2017 OA right knee  Balance is not good but mostly due to knees  Went to Dr. Carles Collet and Dr. 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 [name] Cervical Radiculopathy    There are no preventive care reminders to display for this patient.   Colonoscopy 06/2014 Had shingrix vaccines and felt achy the 2nd day Cardiac Risk Factors include: advanced age (>5men, >26 women);dyslipidemia;family history of premature cardiovascular disease;hypertension;male gender     Objective:    Vitals: BP 118/80   Pulse 66   Ht 5\' 10"  (1.778 m)   Wt 193 lb (87.5 kg)   SpO2 95%   BMI 27.69 kg/m   Body mass index is 27.69 kg/m.  Advanced Directives 07/29/2018 12/18/2016 04/02/2016 04/02/2016 03/26/2016 03/24/2016 02/14/2016  Does Patient Have a Medical Advance Directive? Yes Yes Yes Yes Yes Yes Yes  Type of Advance Directive - [place];Living will [place];Living will [place];Living will [place];Living will Living will;Healthcare Power of [place];Living will  Does patient want to make changes to medical advance directive? - - - No - Patient declined - - No - Patient declined  Copy of  [place] in Chart? - - No - copy requested No - copy requested No - copy requested No - copy requested No - copy requested    Tobacco Social History   Tobacco Use  Smoking Status Never Smoker  Smokeless Tobacco Never Used     Counseling given: Yes   Clinical Intake:     Past Medical History:  Diagnosis Date  . Anxiety   . Benign essential tremor    hands  . Complication of anesthesia 1999   spinal with last knee replacement  . Diverticulitis    last Saturday 03/24/16 -tx with antibiotics  . GERD (gastroesophageal reflux disease)   . H/O balanitis    recurrent intermittant-- uses diprolene cream prn  . History of colon polyps   . History of kidney stones   . History of prostate cancer urologist-  dr Alinda Money-  currently PSA nondetectable 10/ 2016   s/p  radial prostatectomy w/ nerve sparing 07-17-2007/   stage T1c,  Gleason 3+3=6,  PSA 6.33  . Hyperlipidemia   . Hypertension   . Mild obstructive sleep apnea    uses mouth guard only  . Nocturia   . Osteoarthritis    knees  . Sigmoid diverticulosis   . Synovial hypertrophy of left knee   . Urge urinary incontinence    Past Surgical History:  Procedure Laterality  Date  . BACK SURGERY     L4 surgery  . CARDIOVASCULAR STRESS TEST  08-04-2014   normal perfusion nuclear study/  normal LV function and wall motion , ef 54%  . COLONOSCOPY W/ POLYPECTOMY  06-21-2014  . JOINT REPLACEMENT    . KNEE ARTHROSCOPY Bilateral right 07-31-2009//  left   . KNEE ARTHROSCOPY Left 12/08/2014   Procedure: ARTHROSCOPY LEFT KNEE WITH SYNOVECTOMY;  Surgeon: Gaynelle Arabian, MD;  [facility];  Service: Orthopedics;  Laterality: Left;  . KNEE ARTHROSCOPY Left 02/14/2016   Procedure: ARTHROSCOPY KNEE WTH SYNOVECTOMY;  Surgeon: Gaynelle Arabian, MD;  Location[facility];  Service: Orthopedics;  Laterality: Left;  . LUMBAR DISC SURGERY  04-13-2002   left  L4 -- L5  . ROBOT ASSISTED  LAPAROSCOPIC RADICAL PROSTATECTOMY  07-17-2007  . TONSILLECTOMY  as child  . TOTAL KNEE ARTHROPLASTY Left 03-08-2008  . TOTAL KNEE ARTHROPLASTY Right 04/02/2016   Procedure: RIGHT TOTAL KNEE ARTHROPLASTY;  Surgeon: Gaynelle Arabian, MD;  Location: [facility];  Service: Orthopedics;  Laterality: Right;   Family History  Problem Relation Age of Onset  . Sudden death Mother   . Aneurysm Father   . Heart attack Unknown        grandfather   Social History   Socioeconomic History  . Marital status: Married    Spouse name: Not on file  . Number of children: Not on file  . Years of education: Not on file  . Highest education level: Not on file  Occupational History  . Occupation: retired    Fish farm manager: [employer]    Comment: Geographical information systems officer  . Financial resource strain: Not on file  . Food insecurity:    Worry: Not on file    Inability: Not on file  . Transportation needs:    Medical: Not on file    Non-medical: Not on file  Tobacco Use  . Smoking status: Never Smoker  . Smokeless tobacco: Never Used  Substance and Sexual Activity  . Alcohol use: Yes    Comment: social  . Drug use: No  . Sexual activity: Not on file  Lifestyle  . Physical activity:    Days per week: Not on file    Minutes per session: Not on file  . Stress: Not on file  Relationships  . Social connections:    Talks on phone: Not on file    Gets together: Not on file    Attends religious service: Not on file    Active member of club or organization: Not on file    Attends meetings of clubs or organizations: Not on file    Relationship status: Not on file  Other Topics Concern  . Not on file  Social History Narrative  . Not on file    Outpatient Encounter Medications as of 07/29/2018  Medication Sig  . acetaminophen (TYLENOL) 500 MG tablet Take 1,000 mg by mouth every 6 (six) hours as needed for mild pain.   Marland Kitchen aspirin EC 81 MG tablet Take 81 mg by mouth daily.  Marland Kitchen atorvastatin (LIPITOR) 20  MG tablet TAKE 1 TABLET BY MOUTH  DAILY AT 6 PM.  . olmesartan (BENICAR) 20 MG tablet Take 1 tablet (20 mg total) by mouth daily.  Marland Kitchen olmesartan (BENICAR) 20 MG tablet TAKE 1 TABLET BY MOUTH  DAILY  . sertraline (ZOLOFT) 50 MG tablet TAKE 1 TABLET BY MOUTH  DAILY  . gabapentin (NEURONTIN) 600 MG tablet Take 1  tablet (600 mg total) by mouth 3 (three) times daily. Slow taper off as directed. (Patient not taking: Reported on 07/29/2018)  . meloxicam (MOBIC) 15 MG tablet Take 1 tablet (15 mg total) by mouth daily. Take with food (Patient not taking: Reported on 07/29/2018)   Facility-Administered Encounter Medications as of 07/29/2018  Medication  . methylPREDNISolone acetate (DEPO-MEDROL) injection 40 mg    Activities of Daily Living In your present state of health, do you have any difficulty performing the following activities: 07/29/2018  Hearing? N  Vision? N  Difficulty concentrating or making decisions? N  Walking or climbing stairs? N  Dressing or bathing? N  Doing errands, shopping? N  Preparing Food and eating ? N  Using the Toilet? N  In the past six months, have you accidently leaked urine? N  Do you have problems with loss of bowel control? N  Managing your Medications? N  Managing your Finances? N  Housekeeping or managing your Housekeeping? N  Some recent data might be hidden    Patient Care Team: [name], MD as PCP - General Leeroy Cha, MD as Attending Physician (Neurosurgery)   Assessment:   This is a routine wellness examination for Micheal [name].  Exercise Activities and Dietary recommendations Current Exercise Habits: Home exercise routine, Type of exercise: walking(both knees replaced, 2 back issues; does not play tennis anymore ), Intensity: Mild  Goals    . patient     Continue fishing and reducing stress     . Patient Stated     To maintain health  Catch the big one!       Fall Risk Fall Risk  07/29/2018 04/03/2018 07/24/2017 12/19/2016  12/18/2016  Falls in the past year? 1 Yes No No No  Comment working in garden; leaned on something unstable;  stubbed tub on edge of bed  - - - -  Number falls in past yr: 1 1 - - -  Injury with Fall? - No - - -  Risk for fall due to : - Other (Comment) - - -  Follow up - Falls evaluation completed;Education provided;Falls prevention discussed - - -     Depression Screen PHQ 2/9 Scores 07/29/2018 07/24/2017 12/19/2016 12/18/2016  PHQ - 2 Score 0 0 0 0    Cognitive Function MMSE - Mini Mental State Exam 07/29/2018 07/29/2018  Not completed: (No Data) (No Data)   Ad8 score reviewed for issues:  Issues making decisions:  Less interest in hobbies / activities:  Repeats questions, stories (family complaining):  Trouble using ordinary gadgets (microwave, computer, phone):  Forgets the month or year:   Mismanaging finances:   Remembering appts:  Daily problems with thinking and/or memory: Ad8 score is=          Immunization History  Administered Date(s) Administered  . Influenza, High Dose Seasonal PF 06/22/2014  . Influenza-Unspecified 07/19/2015, 06/20/2016, 06/10/2017, 06/09/2018  . Pneumococcal Conjugate-13 10/26/2013  . Pneumococcal Polysaccharide-23 02/16/2007, 10/31/2015  . Td 10/18/2004  . Tdap 12/25/2013  . Zoster 02/16/2007  . Zoster Recombinat (Shingrix) 07/20/2018      Screening Tests Health Maintenance  Topic Date Due  . Hepatitis C Screening  07/30/2019 (Originally [birth year])  . TETANUS/TDAP  12/26/2023  . COLONOSCOPY  06/21/2024  . INFLUENZA VACCINE  Completed  . PNA vac Low Risk Adult  Completed         Plan:      PCP Notes   Health Maintenance Colonoscopy 06/2014 Had shingrix vaccines and felt achy  the 2nd day PSA 12/2016   Abnormal Screens  None  Referrals  None  Patient concerns; Does he need to take ASA any more  To run by Dr. Elease Hashimoto   Ibuprofen or Tori Milks;  Plantar fascitis; trigger finger on hands (seeing MD)     Nurse Concerns; As noted  Next PCP apt To make an apt for general labs      I have personally reviewed and noted the following in the patient's chart:   . Medical and social history . Use of alcohol, tobacco or illicit drugs  . Current medications and supplements . Functional ability and status . Nutritional status . Physical activity . Advanced directives . List of other physicians . Hospitalizations, surgeries, and ER visits in previous 12 months . Vitals . Screenings to include cognitive, depression, and falls . Referrals and appointments  In addition, I have reviewed and discussed with patient certain preventive protocols, quality metrics, and best practice recommendations. A written personalized care plan for preventive services as well as general preventive health recommendations were provided to patient.     MWUXL,KGMWN, RN  07/29/2018  I have reviewed the documentation for the AWV and [place] provided by the health coach and agree with their documentation. I was immediately available for any questions  [name] MD [facility]

## 2018-07-29 ENCOUNTER — Ambulatory Visit (INDEPENDENT_AMBULATORY_CARE_PROVIDER_SITE_OTHER): Payer: Medicare Other

## 2018-07-29 ENCOUNTER — Telehealth: Payer: Self-pay

## 2018-07-29 VITALS — BP 118/80 | HR 66 | Ht 70.0 in | Wt 193.0 lb

## 2018-07-29 DIAGNOSIS — Z0000 Encounter for general adult medical examination without abnormal findings: Secondary | ICD-10-CM | POA: Diagnosis not present

## 2018-07-29 NOTE — Telephone Encounter (Signed)
This is a "loaded" question.  I would like to address the ASA question with him here at visit..  Based on AHA and ACC guidelines, probably not > 70.

## 2018-07-29 NOTE — Telephone Encounter (Signed)
AWV 07/29/2018 Asked if he needed to take asa 81mg  He is going to make an apt for blood work and med review Stopped neurontin for now  Please advise  Thanks

## 2018-07-29 NOTE — Patient Instructions (Addendum)
Micheal [name] , Thank you for taking time to come for your Medicare Wellness Visit. I appreciate your ongoing commitment to your health goals. Please review the following plan we discussed and let me know if I can assist you in the future.   Medicare now request all "baby boomers" test for possible exposure to Hepatitis C. Many may have been exposed due to dental work, tatoo's, vaccinations when young. The Hepatitis C virus is dormant for many years and then sometimes will cause liver cancer. If you gave blood in the past 15 years, you were most likely checked for Hep C. If you rec'd blood; you may want to consider testing or if you are high risk for any other reason.      These are the goals we discussed: Goals    . patient     Continue fishing and reducing stress        This is a list of the screening recommended for you and due dates:  Health Maintenance  Topic Date Due  .  Hepatitis C: One time screening is recommended by Center for Disease Control  (CDC) for  adults born from [birth year] through 1964.   September 09, 1946  . Tetanus Vaccine  12/26/2023  . Colon Cancer Screening  06/21/2024  . Flu Shot  Completed  . Pneumonia vaccines  Completed   Summary: Preventive Care for Adults  A healthy lifestyle and preventive care can promote health and wellness. Preventive health guidelines for adults include the following key practices.  . A routine yearly physical is a good way to check with your health care provider about your health and preventive screening. It is a chance to share any concerns and updates on your health and to receive a thorough exam.  . Visit your dentist for a routine exam and preventive care every 6 months. Brush your teeth twice a day and floss once a day. Good oral hygiene prevents tooth decay and gum disease.  . The frequency of eye exams is based on your age, health, family medical history, use  of contact lenses, and other factors. Follow your health care  provider's ecommendations for frequency of eye exams.  . Eat a healthy diet. Foods like vegetables, fruits, whole grains, low-fat dairy products, and lean protein foods contain the nutrients you need without too many calories. Decrease your intake of foods high in solid fats, added sugars, and salt. Eat the right amount of calories for you. Get information about a proper diet from your health care provider, if necessary.  . Regular physical exercise is one of the most important things you can do for your health. Most adults should get at least 150 minutes of moderate-intensity exercise (any activity that increases your heart rate and causes you to sweat) each week. In addition, most adults need muscle-strengthening exercises on 2 or more days a week.  Silver Sneakers may be a benefit available to you. To determine eligibility, you may visit the website: www.silversneakers.com or contact program at (929)459-2047 Mon-Fri between 8AM-8PM.   . Maintain a healthy weight. The body mass index (BMI) is a screening tool to identify possible weight problems. It provides an estimate of body fat based on height and weight. Your health care provider can find your BMI and can help you achieve or maintain a healthy weight.   For adults 20 years and older: ? A BMI below 18.5 is considered underweight. ? A BMI of 18.5 to 24.9 is normal. ? A BMI of 27 to  28 is considered normal by the Institutes of Health  ? A BMI of 30 and above is considered obese.   . Maintain normal blood lipids and cholesterol levels by exercising and minimizing your intake of saturated fat. Eat a balanced diet with plenty of fruit and vegetables. Blood tests for lipids and cholesterol should begin at age 74 and be repeated every 5 years. If your lipid or cholesterol levels are high, you are over 50, or you are at high risk for heart disease, you may need your cholesterol levels checked more frequently. Ongoing high lipid and cholesterol  levels should be treated with medicines if diet and exercise are not working.  . If you smoke, find out from your health care provider how to quit. If you do not use tobacco, please do not start.  . If you choose to drink alcohol, please do not consume more than one drink for women and 2 for men.  One drink is considered to be 12 ounces (355 mL) of beer, 5 ounces (148 mL) of wine, or 1.5 ounces (44 mL) of liquor. Moderation of alcohol intake to this level decreases your risk of breast cancer and liver damage.   . If you are 24-60 years old, ask your health care provider if you should take aspirin to prevent strokes.  . Use sunscreen. Apply sunscreen liberally and repeatedly throughout the day. You should seek shade when your shadow is shorter than you. Protect yourself by wearing long sleeves, pants, a wide-brimmed hat, and sunglasses year round, whenever you are outdoors.  . Once a month, do a whole body skin exam, using a mirror to look at the skin on your back. Tell your health care provider of new moles, moles that have irregular borders, moles that are larger than a pencil eraser, or moles that have changed in shape or color.  Last, if you have completed an Advanced Directive; please bring a copy and review with your physician and then we will scan to the medical record      Fall Prevention in the Home Falls can cause injuries. They can happen to people of all ages. There are many things you can do to make your home safe and to help prevent falls. What can I do on the outside of my home?  Regularly fix the edges of walkways and driveways and fix any cracks.  Remove anything that might make you trip as you walk through a door, such as a raised step or threshold.  Trim any bushes or trees on the path to your home.  Use bright outdoor lighting.  Clear any walking paths of anything that might make someone trip, such as rocks or tools.  Regularly check to see if handrails are  loose or broken. Make sure that both sides of any steps have handrails.  Any raised decks and porches should have guardrails on the edges.  Have any leaves, snow, or ice cleared regularly.  Use sand or salt on walking paths during winter.  Clean up any spills in your garage right away. This includes oil or grease spills. What can I do in the bathroom?  Use night lights.  Install grab bars by the toilet and in the tub and shower. Do not use towel bars as grab bars.  Use non-skid mats or decals in the tub or shower.  If you need to sit down in the shower, use a plastic, non-slip stool.  Keep the floor dry. Clean up any water that  spills on the floor as soon as it happens.  Remove soap buildup in the tub or shower regularly.  Attach bath mats securely with double-sided non-slip rug tape.  Do not have throw rugs and other things on the floor that can make you trip. What can I do in the bedroom?  Use night lights.  Make sure that you have a light by your bed that is easy to reach.  Do not use any sheets or blankets that are too big for your bed. They should not hang down onto the floor.  Have a firm chair that has side arms. You can use this for support while you get dressed.  Do not have throw rugs and other things on the floor that can make you trip. What can I do in the kitchen?  Clean up any spills right away.  Avoid walking on wet floors.  Keep items that you use a lot in easy-to-reach places.  If you need to reach something above you, use a strong step stool that has a grab bar.  Keep electrical cords out of the way.  Do not use floor polish or wax that makes floors slippery. If you must use wax, use non-skid floor wax.  Do not have throw rugs and other things on the floor that can make you trip. What can I do with my stairs?  Do not leave any items on the stairs.  Make sure that there are handrails on both sides of the stairs and use them. Fix handrails that  are broken or loose. Make sure that handrails are as long as the stairways.  Check any carpeting to make sure that it is firmly attached to the stairs. Fix any carpet that is loose or worn.  Avoid having throw rugs at the top or bottom of the stairs. If you do have throw rugs, attach them to the floor with carpet tape.  Make sure that you have a light switch at the top of the stairs and the bottom of the stairs. If you do not have them, ask someone to add them for you. What else can I do to help prevent falls?  Wear shoes that: ? Do not have high heels. ? Have rubber bottoms. ? Are comfortable and fit you well. ? Are closed at the toe. Do not wear sandals.  If you use a stepladder: ? Make sure that it is fully opened. Do not climb a closed stepladder. ? Make sure that both sides of the stepladder are locked into place. ? Ask someone to hold it for you, if possible.  Clearly mark and make sure that you can see: ? Any grab bars or handrails. ? First and last steps. ? Where the edge of each step is.  Use tools that help you move around (mobility aids) if they are needed. These include: ? Canes. ? Walkers. ? Scooters. ? Crutches.  Turn on the lights when you go into a dark area. Replace any light bulbs as soon as they burn out.  Set up your furniture so you have a clear path. Avoid moving your furniture around.  If any of your floors are uneven, fix them.  If there are any pets around you, be aware of where they are.  Review your medicines with your doctor. Some medicines can make you feel dizzy. This can increase your chance of falling. Ask your doctor what other things that you can do to help prevent falls. This information is not intended  to replace advice given to you by your health care provider. Make sure you discuss any questions you have with your health care provider. Document Released: 06/30/2009 Document Revised: 02/09/2016 Document Reviewed: 10/08/2014 Elsevier  Interactive Patient Education  [employer].

## 2018-07-30 NOTE — Telephone Encounter (Signed)
Call to Mr. Zambito and left VM which he agreed to during AWV. Requested he make an apt with Dr. Elease Hashimoto to discuss pros and cons of ASA   Wynetta Fines RN

## 2018-08-25 ENCOUNTER — Encounter: Payer: Self-pay | Admitting: Family Medicine

## 2018-08-25 ENCOUNTER — Ambulatory Visit (INDEPENDENT_AMBULATORY_CARE_PROVIDER_SITE_OTHER): Payer: Medicare Other | Admitting: Family Medicine

## 2018-08-25 ENCOUNTER — Other Ambulatory Visit: Payer: Self-pay

## 2018-08-25 VITALS — BP 130/82 | HR 67 | Temp 98.4°F | Ht 70.0 in | Wt 192.3 lb

## 2018-08-25 DIAGNOSIS — M722 Plantar fascial fibromatosis: Secondary | ICD-10-CM | POA: Diagnosis not present

## 2018-08-25 DIAGNOSIS — E785 Hyperlipidemia, unspecified: Secondary | ICD-10-CM

## 2018-08-25 DIAGNOSIS — M6530 Trigger finger, unspecified finger: Secondary | ICD-10-CM | POA: Diagnosis not present

## 2018-08-25 DIAGNOSIS — Z8546 Personal history of malignant neoplasm of prostate: Secondary | ICD-10-CM | POA: Diagnosis not present

## 2018-08-25 DIAGNOSIS — I10 Essential (primary) hypertension: Secondary | ICD-10-CM

## 2018-08-25 NOTE — Progress Notes (Signed)
Subjective:     Patient ID: Micheal [name], male   DOB: [birth year], 72 y.o.   MRN: [id number]  HPI Patient is here today for multiple issues  Questions about aspirin use.  Takes 81 mg daily.  He notes no history of CAD or cerebrovascular disease but does have family history of heart disease in his father.  He has been on this for years.  No history of ulcer disease  He has had some issues with balance.  Prior history of back surgery.  Had nerve conduction velocities through neurology several months ago.  He had some questions regarding these results.  No progressive neuropathy symptoms.  Trigger finger involving right ring and left middle finger.  These were injected per orthopedist with steroids and he had some mild improvement but now has had recurrence.  He has history of plantar fasciitis which has been recurrent.  He has had orthotics made previously.  Pain is getting to be more continuous.  Is doing some stretches and icing.  Past Medical History:  Diagnosis Date  . Anxiety   . Benign essential tremor    hands  . Complication of anesthesia 1999   spinal with last knee replacement  . Diverticulitis    last Saturday 03/24/16 -tx with antibiotics  . GERD (gastroesophageal reflux disease)   . H/O balanitis    recurrent intermittant-- uses diprolene cream prn  . History of colon polyps   . History of kidney stones   . History of prostate cancer urologist-  dr Alinda Money-  currently PSA nondetectable 10/ 2016   s/p  radial prostatectomy w/ nerve sparing 07-17-2007/   stage T1c,  Gleason 3+3=6,  PSA 6.33  . Hyperlipidemia   . Hypertension   . Mild obstructive sleep apnea    uses mouth guard only  . Nocturia   . Osteoarthritis    knees  . Sigmoid diverticulosis   . Synovial hypertrophy of left knee   . Urge urinary incontinence    Past Surgical History:  Procedure Laterality Date  . BACK SURGERY     L4 surgery  . CARDIOVASCULAR STRESS TEST  08-04-2014   normal perfusion  nuclear study/  normal LV function and wall motion , ef 54%  . COLONOSCOPY W/ POLYPECTOMY  06-21-2014  . JOINT REPLACEMENT    . KNEE ARTHROSCOPY Bilateral right 07-31-2009//  left   . KNEE ARTHROSCOPY Left 12/08/2014   Procedure: ARTHROSCOPY LEFT KNEE WITH SYNOVECTOMY;  Surgeon: Gaynelle Arabian, MD;  Location: [facility];  Service: Orthopedics;  Laterality: Left;  . KNEE ARTHROSCOPY Left 02/14/2016   Procedure: ARTHROSCOPY KNEE WTH SYNOVECTOMY;  Surgeon: Gaynelle Arabian, MD;  Location: [facility];  Service: Orthopedics;  Laterality: Left;  . LUMBAR DISC SURGERY  04-13-2002   left  L4 -- L5  . ROBOT ASSISTED LAPAROSCOPIC RADICAL PROSTATECTOMY  07-17-2007  . TONSILLECTOMY  as child  . TOTAL KNEE ARTHROPLASTY Left 03-08-2008  . TOTAL KNEE ARTHROPLASTY Right 04/02/2016   Procedure: RIGHT TOTAL KNEE ARTHROPLASTY;  Surgeon: Gaynelle Arabian, MD;  Location: [facility];  Service: Orthopedics;  Laterality: Right;    reports that he has never smoked. He has never used smokeless tobacco. He reports that he drinks alcohol. He reports that he does not use drugs. family history includes Aneurysm in his father; Heart attack in his unknown relative; Sudden death in his mother. No Known Allergies   Review of Systems  Constitutional: Negative for fatigue.  Eyes: Negative for visual disturbance.  Respiratory:  Negative for cough, chest tightness and shortness of breath.   Cardiovascular: Negative for chest pain, palpitations and leg swelling.  Neurological: Negative for dizziness, syncope, weakness, light-headedness and headaches.       Objective:   Physical Exam  Constitutional: He is oriented to person, place, and time. He appears well-developed and well-nourished.  HENT:  Right Ear: External ear normal.  Left Ear: External ear normal.  Mouth/Throat: Oropharynx is clear and moist.  Eyes: Pupils are equal, round, and reactive to light.  Neck: Neck supple. No thyromegaly present.   Cardiovascular: Normal rate and regular rhythm.  Pulmonary/Chest: Effort normal and breath sounds normal. No respiratory distress. He has no wheezes. He has no rales.  Musculoskeletal: He exhibits no edema.  Neurological: He is alert and oriented to person, place, and time.       Assessment:     #1 history of hypertension which is stable  #2 hyperlipidemia.  Patient treated with Lipitor.  Overdue for labs  #3 trigger finger involving right ring and left middle fingers  #4 recurrent plantar fasciitis    Plan:     -Discussed recent guidelines from the [place] preventive task force as well as the AHA and ACC guidelines regarding aspirin use.  They do not recommend aspirin after 70 and he will consider discontinuation -Flu vaccine already given -Ordered future labs with lipid, hepatic, basic metabolic panel -He plans to follow-up with orthopedics regarding his trigger fingers -Answer questions regarding his nerve conduction studies -offered sports medicine referral vs podiatry for plantar faciitis and at this point he wishes to observe  [name] MD [facility]

## 2018-08-25 NOTE — Patient Instructions (Addendum)
Consider stopping the aspirin.    Return Wednesday for labs

## 2018-08-27 ENCOUNTER — Other Ambulatory Visit (INDEPENDENT_AMBULATORY_CARE_PROVIDER_SITE_OTHER): Payer: Medicare Other

## 2018-08-27 DIAGNOSIS — E785 Hyperlipidemia, unspecified: Secondary | ICD-10-CM | POA: Diagnosis not present

## 2018-08-27 DIAGNOSIS — I10 Essential (primary) hypertension: Secondary | ICD-10-CM

## 2018-08-27 DIAGNOSIS — M65341 Trigger finger, right ring finger: Secondary | ICD-10-CM | POA: Diagnosis not present

## 2018-08-27 DIAGNOSIS — M65332 Trigger finger, left middle finger: Secondary | ICD-10-CM | POA: Diagnosis not present

## 2018-08-27 DIAGNOSIS — Z8546 Personal history of malignant neoplasm of prostate: Secondary | ICD-10-CM | POA: Diagnosis not present

## 2018-08-27 DIAGNOSIS — M1812 Unilateral primary osteoarthritis of first carpometacarpal joint, left hand: Secondary | ICD-10-CM | POA: Diagnosis not present

## 2018-08-27 LAB — HEPATIC FUNCTION PANEL
ALK PHOS: 78 U/L (ref 39–117)
ALT: 19 U/L (ref 0–53)
AST: 24 U/L (ref 0–37)
Albumin: 4.1 g/dL (ref 3.5–5.2)
BILIRUBIN TOTAL: 0.6 mg/dL (ref 0.2–1.2)
Bilirubin, Direct: 0.1 mg/dL (ref 0.0–0.3)
Total Protein: 6.8 g/dL (ref 6.0–8.3)

## 2018-08-27 LAB — BASIC METABOLIC PANEL
BUN: 17 mg/dL (ref 6–23)
CALCIUM: 9.5 mg/dL (ref 8.4–10.5)
CO2: 31 mEq/L (ref 19–32)
Chloride: 104 mEq/L (ref 96–112)
Creatinine, Ser: 0.89 mg/dL (ref 0.40–1.50)
GFR: 89.44 mL/min (ref >60.00)
GLUCOSE: 104 mg/dL — AB (ref 70–99)
Potassium: 4.4 mEq/L (ref 3.5–5.1)
SODIUM: 141 meq/L (ref 135–145)

## 2018-08-27 LAB — PSA: PSA: 0.01 ng/mL — ABNORMAL LOW (ref 0.10–4.00)

## 2018-08-27 LAB — LIPID PANEL
Cholesterol: 137 mg/dL (ref 0–200)
HDL: 34.5 mg/dL — AB (ref >39.00)
LDL CALC: 71 mg/dL (ref 0–99)
NonHDL: 102.49
TRIGLYCERIDES: 156 mg/dL — AB (ref 0.0–149.0)
Total CHOL/HDL Ratio: 4
VLDL: 31.2 mg/dL (ref 0.0–40.0)

## 2018-08-28 ENCOUNTER — Encounter: Payer: Self-pay | Admitting: Family Medicine

## 2018-09-19 ENCOUNTER — Other Ambulatory Visit (INDEPENDENT_AMBULATORY_CARE_PROVIDER_SITE_OTHER): Payer: Self-pay | Admitting: Physical Medicine and Rehabilitation

## 2018-09-19 NOTE — Telephone Encounter (Signed)
Please advise 

## 2018-09-22 NOTE — Telephone Encounter (Signed)
He does not need to be taking this everyday, he would to see Dr. Ane Payment PCP if taking everyday, I am ok refilling on time like I did last RX

## 2018-09-22 NOTE — Telephone Encounter (Signed)
Please advise. Refilled for 30 on 09/19/18. Requesting 90.

## 2018-10-17 DIAGNOSIS — H47031 Optic nerve hypoplasia, right eye: Secondary | ICD-10-CM | POA: Diagnosis not present

## 2018-10-17 DIAGNOSIS — H02834 Dermatochalasis of left upper eyelid: Secondary | ICD-10-CM | POA: Diagnosis not present

## 2018-10-17 DIAGNOSIS — H2513 Age-related nuclear cataract, bilateral: Secondary | ICD-10-CM | POA: Diagnosis not present

## 2018-10-17 DIAGNOSIS — H5203 Hypermetropia, bilateral: Secondary | ICD-10-CM | POA: Diagnosis not present

## 2018-11-19 ENCOUNTER — Telehealth (INDEPENDENT_AMBULATORY_CARE_PROVIDER_SITE_OTHER): Payer: Self-pay | Admitting: Physical Medicine and Rehabilitation

## 2018-11-20 ENCOUNTER — Other Ambulatory Visit: Payer: Self-pay | Admitting: Family Medicine

## 2018-11-20 NOTE — Telephone Encounter (Signed)
Left L4 and L5 transforaminal epidural steroid injection.  Patient did have more recent electrodiagnostic study that showed more right radiculopathy than left however.

## 2018-11-20 NOTE — Telephone Encounter (Signed)
Scheduled for 3/23 at 1500.

## 2018-11-24 ENCOUNTER — Telehealth (INDEPENDENT_AMBULATORY_CARE_PROVIDER_SITE_OTHER): Payer: Self-pay | Admitting: *Deleted

## 2018-11-25 ENCOUNTER — Other Ambulatory Visit (INDEPENDENT_AMBULATORY_CARE_PROVIDER_SITE_OTHER): Payer: Self-pay | Admitting: Physical Medicine and Rehabilitation

## 2018-11-25 MED ORDER — ACETAMINOPHEN-CODEINE #3 300-30 MG PO TABS: 1.0000 | ORAL_TABLET | Freq: Three times a day (TID) | ORAL | 0 refills | Status: AC | PRN

## 2018-11-25 NOTE — Telephone Encounter (Signed)
Sent Tylenol #3 to pharm

## 2018-11-25 NOTE — Telephone Encounter (Signed)
Called pt and advised.  

## 2018-12-03 ENCOUNTER — Other Ambulatory Visit: Payer: Self-pay

## 2018-12-03 ENCOUNTER — Ambulatory Visit (INDEPENDENT_AMBULATORY_CARE_PROVIDER_SITE_OTHER): Payer: Medicare Other | Admitting: Physical Medicine and Rehabilitation

## 2018-12-03 ENCOUNTER — Ambulatory Visit (INDEPENDENT_AMBULATORY_CARE_PROVIDER_SITE_OTHER): Payer: Self-pay

## 2018-12-03 ENCOUNTER — Encounter (INDEPENDENT_AMBULATORY_CARE_PROVIDER_SITE_OTHER): Payer: Self-pay | Admitting: Physical Medicine and Rehabilitation

## 2018-12-03 VITALS — BP 152/92 | HR 63 | Temp 98.2°F

## 2018-12-03 DIAGNOSIS — M5416 Radiculopathy, lumbar region: Secondary | ICD-10-CM | POA: Diagnosis not present

## 2018-12-03 DIAGNOSIS — M25572 Pain in left ankle and joints of left foot: Secondary | ICD-10-CM

## 2018-12-03 DIAGNOSIS — M545 Low back pain: Secondary | ICD-10-CM

## 2018-12-03 DIAGNOSIS — M25552 Pain in left hip: Secondary | ICD-10-CM | POA: Diagnosis not present

## 2018-12-03 DIAGNOSIS — E785 Hyperlipidemia, unspecified: Secondary | ICD-10-CM

## 2018-12-03 DIAGNOSIS — M961 Postlaminectomy syndrome, not elsewhere classified: Secondary | ICD-10-CM | POA: Diagnosis not present

## 2018-12-03 DIAGNOSIS — I10 Essential (primary) hypertension: Secondary | ICD-10-CM | POA: Diagnosis not present

## 2018-12-03 MED ORDER — BETAMETHASONE SOD PHOS & ACET 6 (3-3) MG/ML IJ SUSP
12.0000 mg | Freq: Once | INTRAMUSCULAR | Status: AC
  Administered 2018-12-03: 12 mg

## 2018-12-03 NOTE — Progress Notes (Signed)
 .  Numeric Pain Rating Scale and Functional Assessment Average Pain 8   In the last MONTH (on 0-10 scale) has pain interfered with the following?  1. General activity like being  able to carry out your everyday physical activities such as walking, climbing stairs, carrying groceries, or moving a chair?  Rating(5)   +Driver, -BT, -Dye Allergies.  

## 2018-12-03 NOTE — Progress Notes (Signed)
Micheal [name] - 73 y.o. male MRN [id number]  Date of birth: 03-01-1946  Office Visit Note: Visit Date: 12/03/2018 PCP: [name], MD Referred by: [name], MD  Subjective: Chief Complaint  Patient presents with  . Lower Back - Pain  . Left Leg - Pain  . Left Hip - Pain  . Left Foot - Pain   HPI: Micheal [name] is a 73 y.o. male who comes in today Reevaluation of chronic worsening left hip and buttock pain now with left hip and leg pain down into the anterior ankle and a pretty classic L5 distribution.  He denies any specific trauma or focal weakness.  There is no right-sided complaints.  He has worsening with standing and walking and relieved at rest.  He reports that this started several weeks ago it is has not let up with conservative care with activity modification and medication.  Interestingly we saw him in July of last year with acute onset of left low back pain that really resolved with conservative care and anti-inflammatory.  At the time he was being evaluated by Dr. Wells Guiles Tat and he was sent to Dr. Narda Amber for electrodiagnostic study.  They did note on the left side there was a chronic L5 radiculopathy.  He has had prior lumbar surgery at L3-4 and L4-5.  Both of these were on the left side.  MRI of the lumbar spine was a few years ago.  He has not noted any change in bowel or bladder function fevers chills or night sweats or any other red flag complaints.  Review of Systems  Constitutional: Negative for chills, fever, malaise/fatigue and weight loss.  HENT: Negative for hearing loss and sinus pain.   Eyes: Negative for blurred vision, double vision and photophobia.  Respiratory: Negative for cough and shortness of breath.   Cardiovascular: Negative for chest pain, palpitations and leg swelling.  Gastrointestinal: Negative for abdominal pain, nausea and vomiting.  Genitourinary: Negative for flank pain.  Musculoskeletal: Positive for back pain and  joint pain. Negative for myalgias.       Left hip and leg pain  Skin: Negative for itching and rash.  Neurological: Negative for tremors, focal weakness and weakness.  Endo/Heme/Allergies: Negative.   Psychiatric/Behavioral: Negative for depression.  All other systems reviewed and are negative.  Otherwise per HPI.  Assessment & Plan: Visit Diagnoses:  1. Lumbar radiculopathy   2. Post laminectomy syndrome   3. Pain in left hip     Plan: Findings:  Several weeks of worsening severe left hip and leg pain that seem to be clearly radicular in nature and more of an L5 distribution.  MRI findings have shown significant degenerative disc changes at L3-4 from surgical issue there without any scar tissue formation or central stenosis.  There is some left-sided severe foraminal narrowing but this does not fit his current symptoms.  At L4-5 there is a prior facetectomy and laminectomy decompression.  Canal and lateral recess look well.  Mild scar tissue.  At L5-S1 there is significant degenerative disc height loss I think there is foraminal narrowing here even though it is not called out on that specific MRI.  We could be dealing with lateral recess or foraminal narrowing there could be a small new disc herniation there but regardless we will treat this the same way.  I think given the severity of symptoms we will go ahead today and complete diagnostic healthy therapeutic L5 transforaminal epidural steroid injection on  the left.  If he gets great relief we will just monitor that if he does not get good relief for very short-lived we may need to update MRI of the lumbar spine.  Other avenues would be regrouping with physical therapy for mechanical treatment of the low back and hip area with trigger point and dry needling.  If it were persistent could look at medication trial.    Meds & Orders:  Meds ordered this encounter  Medications  . betamethasone acetate-betamethasone sodium phosphate (CELESTONE)  injection 12 mg    Orders Placed This Encounter  Procedures  . XR C-ARM NO REPORT  . Epidural Steroid injection    Follow-up: Return if symptoms worsen or fail to improve.   Procedures: No procedures performed  Lumbosacral Transforaminal Epidural Steroid Injection - Sub-Pedicular Approach with Fluoroscopic Guidance  Patient: Micheal [name]      Date of Birth: [birth year] MRN: [id number] PCP: [name], MD      Visit Date: 12/03/2018   Universal Protocol:    Date/Time: 12/03/2018  Consent Given By: the patient  Position: PRONE  Additional Comments: Vital signs were monitored before and after the procedure. Patient was prepped and draped in the usual sterile fashion. The correct patient, procedure, and site was verified.   Injection Procedure Details:  Procedure Site One Meds Administered:  Meds ordered this encounter  Medications  . betamethasone acetate-betamethasone sodium phosphate (CELESTONE) injection 12 mg    Laterality: Left  Location/Site:  L5-S1  Needle size: 22 G  Needle type: Spinal  Needle Placement: Transforaminal  Findings:    -Comments: Excellent flow of contrast along the nerve and into the epidural space.  Procedure Details: After squaring off the end-plates to get a true AP view, the C-arm was positioned so that an oblique view of the foramen as noted above was visualized. The target area is just inferior to the "nose of the scotty dog" or sub pedicular. The soft tissues overlying this structure were infiltrated with 2-3 ml. of 1% Lidocaine without Epinephrine.  The spinal needle was inserted toward the target using a "trajectory" view along the fluoroscope beam.  Under AP and lateral visualization, the needle was advanced so it did not puncture dura and was located close the 6 O'Clock position of the pedical in AP tracterory. Biplanar projections were used to confirm position. Aspiration was confirmed to be negative for CSF and/or  blood. A 1-2 ml. volume of Isovue-250 was injected and flow of contrast was noted at each level. Radiographs were obtained for documentation purposes.   After attaining the desired flow of contrast documented above, a 0.5 to 1.0 ml test dose of 0.25% Marcaine was injected into each respective transforaminal space.  The patient was observed for 90 seconds post injection.  After no sensory deficits were reported, and normal lower extremity motor function was noted,   the above injectate was administered so that equal amounts of the injectate were placed at each foramen (level) into the transforaminal epidural space.   Additional Comments:  The patient tolerated the procedure well Dressing: 2 x 2 sterile gauze and Band-Aid    Post-procedure details: Patient was observed during the procedure. Post-procedure instructions were reviewed.  Patient left the clinic in stable condition.    Clinical History: NCV & EMG Findings: 05/01/2018 Extensive electrodiagnostic testing of the right lower extremity and additional studies of the left shows:  1. Bilateral sural and superficial peroneal sensory responses are within normal limits. 2. Left peroneal motor  response shows reduced amplitude at the extensor digitorum brevis, and is asymmetrically reduced at the tibialis anterior when compared to the right. Bilateral tibial and right peroneal motor responses are within normal limits. 3. Bilateral tibial H reflex studies are within normal limits. 4. Chronic motor axon loss changes are seen affecting the L5 myotome on the left, without accompanied active denervation.  Impression: 1. 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 2. There is no evidence of a large fiber sensorimotor polyneuropathy affecting the lower extremities.    ___________________________ Narda Amber, DO  MRI LUMBAR SPINE WITHOUT AND WITH CONTRAST   L2-L3: Disc desiccation and  small bulge with severe bilateral facet hypertrophy resulting in moderate narrowing of the central spinal canal that has progressed slightly from the prior study. No neural foraminal stenosis.  L3-L4: Disc space narrowing and desiccation with prior laminotomy on the left. No spinal canal stenosis. Moderate right and severe left neural foraminal stenosis, unchanged.  L4-L5: Postsurgical changes. Severe bilateral facet hypertrophy and small disc bulge. No central spinal canal stenosis. Bilateral lateral recess narrowing, unchanged. Left foraminal disc protrusion it is decreased in size. Mild left foraminal narrowing with small amount of granulation tissue.  L5-S1: Disc space narrowing and endplate spurring with small central disc osteophyte complex. No spinal canal stenosis. No neural foraminal stenosis.  Visualized sacrum: Normal.  IMPRESSION: 1. Status post partial left facetectomy at L4-L5 with improved patency of the neural foramen. Mild persistent foraminal narrowing secondary to small amount of granulation tissue. 2. Mild progression of disc and facet degeneration at L2-L3 with resulting moderate spinal canal stenosis. 3. Unchanged postsurgical appearance at L3-L4 with moderate right and severe left foraminal stenosis. 4. Multilevel severe facet arthrosis.   Electronically Signed By: Ulyses Jarred M.D. On: 11/18/2016 23:08 MRI THORACIC SPINE WITHOUT CONTRAST  TECHNIQUE: Multiplanar, multisequence MR imaging of the thoracic spine was performed. No intravenous contrast was administered.  COMPARISON: Chest radiography 09/11/2017.  FINDINGS: Alignment: Normal  Vertebrae: No fracture or primary bone lesion.  Cord: No cord compression or primary cord lesion.  Paraspinal and other soft tissues: Normal  Disc levels:  Cervical scout study shows degenerative spondylosis at C4-5, C5-6 and C6-7.  T1-2: Normal.  T2-3: Normal.  T3-4: Shallow  left-sided disc bulge. No stenosis or neural compression.  T4-5: Shallow left-sided disc bulge. No stenosis or neural compression.  T5-6: Endplate osteophytes and bulging of the disc. Narrowing of the ventral subarachnoid space but no compressive effect upon the canal or foramina.  T6-7: Left-sided disc bulge. No stenosis or neural compression.  T7-8: Mild bulging of the disc. No stenosis or neural compression.  T8-9: Mild bulging of the disc. No stenosis or neural compression.  T9-10: Normal interspace.  T10-11: Left paracentral disc bulge. No stenosis or neural compression.  T11-12: Normal interspace.  T12-L1: Normal interspace.  No significant facet arthropathy.  IMPRESSION: No cause of the presenting symptoms is identified. Mild degenerative changes of the spine for a person of this age. No advanced disease, neural compressive findings or active bone or joint pathology seen.   Electronically Signed By: Nelson Chimes M.D. On: 09/25/2017 08:40   He reports that he has never smoked. He has never used smokeless tobacco. No results for input(s): HGBA1C, LABURIC in the last 8760 hours.  Objective:  VS:  HT:    WT:   BMI:     BP:(!) 152/92  HR:63bpm  TEMP:98.2 F (36.8 C)(Oral)  RESP:  Physical Exam Vitals signs and  nursing note reviewed.  Constitutional:      General: He is not in acute distress.    Appearance: Normal appearance. He is well-developed.  HENT:     Head: Normocephalic and atraumatic.  Eyes:     Conjunctiva/sclera: Conjunctivae normal.     Pupils: Pupils are equal, round, and reactive to light.  Neck:     Musculoskeletal: Normal range of motion and neck supple. No neck rigidity.  Cardiovascular:     Rate and Rhythm: Normal rate.     Pulses: Normal pulses.     Heart sounds: Normal heart sounds.  Pulmonary:     Effort: Pulmonary effort is normal. No respiratory distress.  Musculoskeletal:     Right lower leg: No edema.     Left  lower leg: No edema.     Comments: Patient has mild tenderness over the left greater trochanter but he ambulates without aid.  He has good distal strength and can toe and heel walk.  He has no clonus.  Skin:    General: Skin is warm and dry.     Findings: No erythema or rash.  Neurological:     General: No focal deficit present.     Mental Status: He is alert and oriented to person, place, and time.     Sensory: No sensory deficit.     Coordination: Coordination normal.     Gait: Gait abnormal.  Psychiatric:        Mood and Affect: Mood normal.        Behavior: Behavior normal.     Ortho Exam Imaging: Xr C-arm No Report  Result Date: 12/03/2018 Please see Notes tab for imaging impression.   Past Medical/Family/Surgical/Social History: Medications & Allergies reviewed per EMR, new medications updated. Patient Active Problem List   Diagnosis Date Noted  . Right-sided chest wall pain 03/27/2018  . Pain of right scapula 03/27/2018  . Spinal stenosis of cervical region 03/27/2018  . Knee pain, bilateral 12/18/2016  . Lumbar degenerative disc disease 12/18/2016  . OA (osteoarthritis) of knee 04/02/2016  . Synovitis of knee 12/08/2014  . Peripheral neuropathy 11/11/2013  . Chronic anxiety 10/26/2013  . Plantar fasciitis of left foot 01/01/2013  . Osteoarthritis of right knee 01/01/2013  . Tear, knee, lateral collateral ligament 01/01/2013  . INSOMNIA, CHRONIC 10/17/2010  . FATIGUE 10/17/2010  . Tremor, essential 07/20/2010  . Hyperlipidemia 03/03/2010  . Essential hypertension 03/03/2010  . ADENOCARCINOMA, PROSTATE, HX OF 03/03/2010  . NEPHROLITHIASIS, HX OF 03/03/2010   Past Medical History:  Diagnosis Date  . Anxiety   . Benign essential tremor    hands  . Complication of anesthesia 1999   spinal with last knee replacement  . Diverticulitis    last Saturday 03/24/16 -tx with antibiotics  . GERD (gastroesophageal reflux disease)   . H/O balanitis    recurrent  intermittant-- uses diprolene cream prn  . History of colon polyps   . History of kidney stones   . History of prostate cancer urologist-  dr Alinda Money-  currently PSA nondetectable 10/ 2016   s/p  radial prostatectomy w/ nerve sparing 07-17-2007/   stage T1c,  Gleason 3+3=6,  PSA 6.33  . Hyperlipidemia   . Hypertension   . Mild obstructive sleep apnea    uses mouth guard only  . Nocturia   . Osteoarthritis    knees  . Sigmoid diverticulosis   . Synovial hypertrophy of left knee   . Urge urinary incontinence    Family History  Problem Relation Age of Onset  . Sudden death Mother   . Aneurysm Father   . Heart attack Unknown        grandfather   Past Surgical History:  Procedure Laterality Date  . BACK SURGERY     L4 surgery  . CARDIOVASCULAR STRESS TEST  08-04-2014   normal perfusion nuclear study/  normal LV function and wall motion , ef 54%  . COLONOSCOPY W/ POLYPECTOMY  06-21-2014  . JOINT REPLACEMENT    . KNEE ARTHROSCOPY Bilateral right 07-31-2009//  left   . KNEE ARTHROSCOPY Left 12/08/2014   Procedure: ARTHROSCOPY LEFT KNEE WITH SYNOVECTOMY;  Surgeon: Gaynelle Arabian, MD;  Location[facility];  Service: Orthopedics;  Laterality: Left;  . KNEE ARTHROSCOPY Left 02/14/2016   Procedure: ARTHROSCOPY KNEE WTH SYNOVECTOMY;  Surgeon: Gaynelle Arabian, MD;  Location: [facility];  Service: Orthopedics;  Laterality: Left;  . LUMBAR DISC SURGERY  04-13-2002   left  L4 -- L5  . ROBOT ASSISTED LAPAROSCOPIC RADICAL PROSTATECTOMY  07-17-2007  . TONSILLECTOMY  as child  . TOTAL KNEE ARTHROPLASTY Left 03-08-2008  . TOTAL KNEE ARTHROPLASTY Right 04/02/2016   Procedure: RIGHT TOTAL KNEE ARTHROPLASTY;  Surgeon: Gaynelle Arabian, MD;  Location: [facility];  Service: Orthopedics;  Laterality: Right;   Social History   Occupational History  . 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 Smoking status: Never Smoker  .  Smokeless tobacco: Never Used  Substance and Sexual Activity  . Alcohol use: Yes    Comment: social  . Drug use: No  . Sexual activity: Not on file

## 2018-12-04 NOTE — Procedures (Signed)
Lumbosacral Transforaminal Epidural Steroid Injection - Sub-Pedicular Approach with Fluoroscopic Guidance  Patient: Micheal [name]      Date of Birth: [birth year] MRN: [id number] PCP: [name], MD      Visit Date: 12/03/2018   Universal Protocol:    Date/Time: 12/03/2018  Consent Given By: the patient  Position: PRONE  Additional Comments: Vital signs were monitored before and after the procedure. Patient was prepped and draped in the usual sterile fashion. The correct patient, procedure, and site was verified.   Injection Procedure Details:  Procedure Site One Meds Administered:  Meds ordered this encounter  Medications  . betamethasone acetate-betamethasone sodium phosphate (CELESTONE) injection 12 mg    Laterality: Left  Location/Site:  L5-S1  Needle size: 22 G  Needle type: Spinal  Needle Placement: Transforaminal  Findings:    -Comments: Excellent flow of contrast along the nerve and into the epidural space.  Procedure Details: After squaring off the end-plates to get a true AP view, the C-arm was positioned so that an oblique view of the foramen as noted above was visualized. The target area is just inferior to the "nose of the scotty dog" or sub pedicular. The soft tissues overlying this structure were infiltrated with 2-3 ml. of 1% Lidocaine without Epinephrine.  The spinal needle was inserted toward the target using a "trajectory" view along the fluoroscope beam.  Under AP and lateral visualization, the needle was advanced so it did not puncture dura and was located close the 6 O'Clock position of the pedical in AP tracterory. Biplanar projections were used to confirm position. Aspiration was confirmed to be negative for CSF and/or blood. A 1-2 ml. volume of Isovue-250 was injected and flow of contrast was noted at each level. Radiographs were obtained for documentation purposes.   After attaining the desired flow of contrast documented above, a 0.5  to 1.0 ml test dose of 0.25% Marcaine was injected into each respective transforaminal space.  The patient was observed for 90 seconds post injection.  After no sensory deficits were reported, and normal lower extremity motor function was noted,   the above injectate was administered so that equal amounts of the injectate were placed at each foramen (level) into the transforaminal epidural space.   Additional Comments:  The patient tolerated the procedure well Dressing: 2 x 2 sterile gauze and Band-Aid    Post-procedure details: Patient was observed during the procedure. Post-procedure instructions were reviewed.  Patient left the clinic in stable condition.

## 2018-12-08 ENCOUNTER — Encounter (INDEPENDENT_AMBULATORY_CARE_PROVIDER_SITE_OTHER): Payer: Medicare Other | Admitting: Physical Medicine and Rehabilitation

## 2018-12-10 ENCOUNTER — Encounter (INDEPENDENT_AMBULATORY_CARE_PROVIDER_SITE_OTHER): Payer: Self-pay | Admitting: Physical Medicine and Rehabilitation

## 2019-02-03 ENCOUNTER — Encounter: Payer: Self-pay | Admitting: Physical Medicine and Rehabilitation

## 2019-02-03 ENCOUNTER — Other Ambulatory Visit: Payer: Self-pay | Admitting: Family Medicine

## 2019-05-10 ENCOUNTER — Other Ambulatory Visit: Payer: Self-pay | Admitting: Family Medicine

## 2019-05-17 DIAGNOSIS — Z23 Encounter for immunization: Secondary | ICD-10-CM | POA: Diagnosis not present

## 2019-08-04 ENCOUNTER — Other Ambulatory Visit: Payer: Self-pay | Admitting: Family Medicine

## 2019-08-07 ENCOUNTER — Other Ambulatory Visit: Payer: Self-pay

## 2019-08-25 ENCOUNTER — Ambulatory Visit (INDEPENDENT_AMBULATORY_CARE_PROVIDER_SITE_OTHER): Payer: Medicare Other

## 2019-08-25 VITALS — BP 138/82 | HR 58 | Temp 96.7°F | Ht 70.0 in | Wt 184.0 lb

## 2019-08-25 DIAGNOSIS — Z0000 Encounter for general adult medical examination without abnormal findings: Secondary | ICD-10-CM

## 2019-08-25 NOTE — Patient Instructions (Addendum)
Micheal [name] , Thank you for taking time to participate in your Medicare Wellness Visit. I appreciate your ongoing commitment to your health goals. Please review the following plan we discussed and let me know if I can assist you in the future.   Screening recommendations/referrals: Colorectal Screening: performed 06/21/2014; up to date; due again 06/21/2024  Vision and Dental Exams: Recommended annual ophthalmology exams for early detection of glaucoma and other disorders of the eye Recommended annual dental exams for proper oral hygiene. Patient states he has an eye exam annually.  Diabetic Exams: Diabetic Eye Exam: N/A Diabetic Foot Exam: N/A  Vaccinations: Influenza vaccine: performed 05/17/2019 Pneumococcal vaccine: performed 10/31/2015; up to date Tdap vaccine: performed 12/25/2013; up to date; due again 12/26/2023 Shingles vaccine: performed 05/08/2018 and 07/20/2018; up to date Advanced directives: Advance directives discussed with you today. Please send a copy of your living will and healthcare power of attorney to our office to place in your chart.  Goals: Recommend to drink at least 6-8 8oz glasses of water per day. Recommend to exercise for at least 150 minutes per week. Continue to stay active !  Next appointment: Please schedule your Annual Wellness Visit with your Nurse Health Advisor in one year.  Preventive Care 16 Years and Older, Male Preventive care refers to lifestyle choices and visits with your health care provider that can promote health and wellness. What does preventive care include?  A yearly physical exam. This is also called an annual well check.  Dental exams once or twice a year.  Routine eye exams. Ask your health care provider how often you should have your eyes checked.  Personal lifestyle choices, including:  Daily care of your teeth and gums.  Regular physical activity.  Eating a healthy diet.  Avoiding tobacco and drug use.  Limiting alcohol use.   Practicing safe sex.  Taking low doses of aspirin every day if recommended by your health care provider..  Taking vitamin and mineral supplements as recommended by your health care provider. What happens during an annual well check? The services and screenings done by your health care provider during your annual well check will depend on your age, overall health, lifestyle risk factors, and family history of disease. Counseling  Your health care provider may ask you questions about your:  Alcohol use.  Tobacco use.  Drug use.  Emotional well-being.  Home and relationship well-being.  Sexual activity.  Eating habits.  History of falls.  Memory and ability to understand (cognition).  Work and work Statistician. Screening  You may have the following tests or measurements:  Height, weight, and BMI.  Blood pressure.  Lipid and cholesterol levels. These may be checked every 5 years, or more frequently if you are over 48 years old.  Skin check.  Lung cancer screening. You may have this screening every year starting at age 84 if you have a 30-pack-year history of smoking and currently smoke or have quit within the past 15 years.  Fecal occult blood test (FOBT) of the stool. You may have this test every year starting at age 71.  Flexible sigmoidoscopy or colonoscopy. You may have a sigmoidoscopy every 5 years or a colonoscopy every 10 years starting at age 26.  Prostate cancer screening. Recommendations will vary depending on your family history and other risks.  Hepatitis C blood test.  Hepatitis B blood test.  Sexually transmitted disease (STD) testing.  Diabetes screening. This is done by checking your blood sugar (glucose) after you have  not eaten for a while (fasting). You may have this done every 1-3 years.  Abdominal aortic aneurysm (AAA) screening. You may need this if you are a current or former smoker.  Osteoporosis. You may be screened starting at age 73  if you are at high risk. Talk with your health care provider about your test results, treatment options, and if necessary, the need for more tests. Vaccines  Your health care provider may recommend certain vaccines, such as:  Influenza vaccine. This is recommended every year.  Tetanus, diphtheria, and acellular pertussis (Tdap, Td) vaccine. You may need a Td booster every 10 years.  Zoster vaccine. You may need this after age 87.  Pneumococcal 13-valent conjugate (PCV13) vaccine. One dose is recommended after age 38.  Pneumococcal polysaccharide (PPSV23) vaccine. One dose is recommended after age 37. Talk to your health care provider about which screenings and vaccines you need and how often you need them. This information is not intended to replace advice given to you by your health care provider. Make sure you discuss any questions you have with your health care provider. Document Released: 09/30/2015 Document Revised: 05/23/2016 Document Reviewed: 07/05/2015 Elsevier Interactive Patient Education  2017 [employer] Prevention in the Home Falls can cause injuries. They can happen to people of all ages. There are many things you can do to make your home safe and to help prevent falls. What can I do on the outside of my home?  Regularly fix the edges of walkways and driveways and fix any cracks.  Remove anything that might make you trip as you walk through a door, such as a raised step or threshold.  Trim any bushes or trees on the path to your home.  Use bright outdoor lighting.  Clear any walking paths of anything that might make someone trip, such as rocks or tools.  Regularly check to see if handrails are loose or broken. Make sure that both sides of any steps have handrails.  Any raised decks and porches should have guardrails on the edges.  Have any leaves, snow, or ice cleared regularly.  Use sand or salt on walking paths during winter.  Clean up any spills in  your garage right away. This includes oil or grease spills. What can I do in the bathroom?  Use night lights.  Install grab bars by the toilet and in the tub and shower. Do not use towel bars as grab bars.  Use non-skid mats or decals in the tub or shower.  If you need to sit down in the shower, use a plastic, non-slip stool.  Keep the floor dry. Clean up any water that spills on the floor as soon as it happens.  Remove soap buildup in the tub or shower regularly.  Attach bath mats securely with double-sided non-slip rug tape.  Do not have throw rugs and other things on the floor that can make you trip. What can I do in the bedroom?  Use night lights.  Make sure that you have a light by your bed that is easy to reach.  Do not use any sheets or blankets that are too big for your bed. They should not hang down onto the floor.  Have a firm chair that has side arms. You can use this for support while you get dressed.  Do not have throw rugs and other things on the floor that can make you trip. What can I do in the kitchen?  Clean up any  spills right away.  Avoid walking on wet floors.  Keep items that you use a lot in easy-to-reach places.  If you need to reach something above you, use a strong step stool that has a grab bar.  Keep electrical cords out of the way.  Do not use floor polish or wax that makes floors slippery. If you must use wax, use non-skid floor wax.  Do not have throw rugs and other things on the floor that can make you trip. What can I do with my stairs?  Do not leave any items on the stairs.  Make sure that there are handrails on both sides of the stairs and use them. Fix handrails that are broken or loose. Make sure that handrails are as long as the stairways.  Check any carpeting to make sure that it is firmly attached to the stairs. Fix any carpet that is loose or worn.  Avoid having throw rugs at the top or bottom of the stairs. If you do have  throw rugs, attach them to the floor with carpet tape.  Make sure that you have a light switch at the top of the stairs and the bottom of the stairs. If you do not have them, ask someone to add them for you. What else can I do to help prevent falls?  Wear shoes that:  Do not have high heels.  Have rubber bottoms.  Are comfortable and fit you well.  Are closed at the toe. Do not wear sandals.  If you use a stepladder:  Make sure that it is fully opened. Do not climb a closed stepladder.  Make sure that both sides of the stepladder are locked into place.  Ask someone to hold it for you, if possible.  Clearly mark and make sure that you can see:  Any grab bars or handrails.  First and last steps.  Where the edge of each step is.  Use tools that help you move around (mobility aids) if they are needed. These include:  Canes.  Walkers.  Scooters.  Crutches.  Turn on the lights when you go into a dark area. Replace any light bulbs as soon as they burn out.  Set up your furniture so you have a clear path. Avoid moving your furniture around.  If any of your floors are uneven, fix them.  If there are any pets around you, be aware of where they are.  Review your medicines with your doctor. Some medicines can make you feel dizzy. This can increase your chance of falling. Ask your doctor what other things that you can do to help prevent falls. This information is not intended to replace advice given to you by your health care provider. Make sure you discuss any questions you have with your health care provider. Document Released: 06/30/2009 Document Revised: 02/09/2016 Document Reviewed: 10/08/2014 Elsevier Interactive Patient Education  2017 [employer].

## 2019-08-25 NOTE — Progress Notes (Signed)
This visit is being conducted via phone call due to the COVID-19 pandemic. This patient has given me verbal consent via phone to conduct this visit, patient states they are participating from their home address. Some vital signs may be absent or patient reported.   Patient identification: identified by name, DOB, and current address.  Location provider: [facility], Office Persons participating in the virtual visit: Micheal [name] and Franne Forts, LPN.    Subjective:   Micheal [name] is a 73 y.o. male who presents for Medicare Annual/Subsequent preventive examination.  Review of Systems:  Cardiac Risk Factors include: advanced age (>62mn, >>76women);hypertension;dyslipidemia;sedentary lifestyle;male gender     Objective:    Vitals: BP 138/82   Pulse (!) 58   Temp (!) 96.7 F (35.9 C) Comment (Src): patient reported  Ht '5\' 10"'  (1.778 m)   Wt 184 lb (83.5 kg)   BMI 26.40 kg/m   Body mass index is 26.4 kg/m.  Advanced Directives 08/25/2019 07/29/2018 12/18/2016 04/02/2016 04/02/2016 03/26/2016 03/24/2016  Does Patient Have a Medical Advance Directive? Yes Yes Yes Yes Yes Yes Yes  Type of AParamedicof [place]Living will - [place]Living will [place]Living will [place] Gr[place] ParkLiving will [place]Living will Living will;Healthcare Power of Attorney  Does patient want to make changes to medical advance directive? No - Patient declined - - - No - Patient declined - -  Copy of [place] Chart? No - copy requested - - No - copy requested No - copy requested No - copy requested No - copy requested    Tobacco Social History   Tobacco Use  Smoking Status Never Smoker  Smokeless Tobacco Never Used     Counseling given: Not Answered   Clinical Intake:  Pre-visit preparation completed: Yes  Pain : 0-10 Pain Score: 5  Pain Location: Hip(Right) Pain  Orientation: Right Pain Descriptors / Indicators: Sharp     Nutritional Status: BMI 25 -29 Overweight Diabetes: No  How often do you need to have someone help you when you read instructions, pamphlets, or other written materials from your doctor or pharmacy?: 1 - Never What is the last grade level you completed in school?: doctoral degree  Interpreter Needed?: No  Information entered by :: CFranne Forts LPN.  Past Medical History:  Diagnosis Date  . Anxiety   . Benign essential tremor    hands  . Complication of anesthesia 1999   spinal with last knee replacement  . Diverticulitis    last Saturday 03/24/16 -tx with antibiotics  . GERD (gastroesophageal reflux disease)   . H/O balanitis    recurrent intermittant-- uses diprolene cream prn  . History of colon polyps   . History of kidney stones   . History of prostate cancer urologist-  dr bAlinda Money  currently PSA nondetectable 10/ 2016   s/p  radial prostatectomy w/ nerve sparing 07-17-2007/   stage T1c,  Gleason 3+3=6,  PSA 6.33  . Hyperlipidemia   . Hypertension   . Mild obstructive sleep apnea    uses mouth guard only  . Nocturia   . Osteoarthritis    knees  . Sigmoid diverticulosis   . Synovial hypertrophy of left knee   . Urge urinary incontinence    Past Surgical History:  Procedure Laterality Date  . BACK SURGERY     L4 surgery  . CARDIOVASCULAR STRESS TEST  08-04-2014   normal perfusion nuclear study/  normal LV function and wall motion , ef 54%  . COLONOSCOPY W/ POLYPECTOMY  06-21-2014  . JOINT REPLACEMENT    . KNEE ARTHROSCOPY Bilateral right 07-31-2009//  left   . KNEE ARTHROSCOPY Left 12/08/2014   Procedure: ARTHROSCOPY LEFT KNEE WITH SYNOVECTOMY;  Surgeon: Gaynelle Arabian, MD;  [facility];  Service: Orthopedics;  Laterality: Left;  . KNEE ARTHROSCOPY Left 02/14/2016   Procedure: ARTHROSCOPY KNEE WTH SYNOVECTOMY;  Surgeon: Gaynelle Arabian, MD;  Location: [facility];  Service: Orthopedics;  Laterality: Left;  . LUMBAR DISC SURGERY  04-13-2002   left  L4 -- L5  . ROBOT ASSISTED LAPAROSCOPIC RADICAL PROSTATECTOMY  07-17-2007  . TONSILLECTOMY  as child  . TOTAL KNEE ARTHROPLASTY Left 03-08-2008  . TOTAL KNEE ARTHROPLASTY Right 04/02/2016   Procedure: RIGHT TOTAL KNEE ARTHROPLASTY;  Surgeon: Gaynelle Arabian, MD;  Location: [facility];  Service: Orthopedics;  Laterality: Right;   Family History  Problem Relation Age of Onset  . Sudden death Mother   . Aneurysm Father   . Heart attack Other        grandfather   Social History   Socioeconomic History  . Marital status: Married    Spouse name: Not on file  . Number of children: 2  . Years of education: doctoral degree  . Highest education level: Doctorate  Occupational History  . Occupation: retired    Fish farm manager: [employer]    Comment: Geographical information systems officer  . Financial resource strain: Not hard at all  . Food insecurity    Worry: Never true    Inability: Never true  . Transportation needs    Medical: No    Non-medical: No  Tobacco Use  . Smoking status: Never Smoker  . Smokeless tobacco: Never Used  Substance and Sexual Activity  . Alcohol use: Yes    Comment: social 1-2 weekly  . Drug use: No  . Sexual activity: Not on file  Lifestyle  . Physical activity    Days per week: 0 days    Minutes per session: Not on file  . Stress: Not at all  Relationships  . Social connections    Talks on phone: More than three times a week    Gets together: Not on file    Attends religious service: Not on file    Active member of club or organization: Not on file    Attends meetings of clubs or organizations: Not on file    Relationship status: Married  Other Topics Concern  . Not on file  Social History Narrative  . Not on file    Outpatient Encounter Medications as of 08/25/2019  Medication Sig  . acetaminophen (TYLENOL) 500 MG tablet Take 1,000 mg by mouth every 6 (six) hours  as needed for mild pain.   Marland Kitchen atorvastatin (LIPITOR) 20 MG tablet TAKE 1 TABLET BY MOUTH  DAILY AT 6 PM  . diphenhydrAMINE (BENADRYL) 25 MG tablet Take 25 mg by mouth at bedtime as needed for sleep.  . magnesium 30 MG tablet Take 30 mg by mouth daily.  Marland Kitchen olmesartan (BENICAR) 20 MG tablet TAKE 1 TABLET BY MOUTH  DAILY  . sertraline (ZOLOFT) 50 MG tablet TAKE 1 TABLET BY MOUTH  DAILY  . aspirin EC 81 MG tablet Take 81 mg by mouth daily.  Marland Kitchen gabapentin (NEURONTIN) 600 MG tablet Take 1 tablet (600 mg total) by mouth 3 (three) times daily. Slow taper off as directed. (Patient not taking: Reported  on 08/25/2019)  . meloxicam (MOBIC) 15 MG tablet TAKE 1 TABLET(15 MG) BY MOUTH DAILY WITH FOOD (Patient not taking: Reported on 08/25/2019)  . Zoster Vaccine Adjuvanted [facility]) injection Shingrix (PF) 50 mcg/0.5 mL intramuscular suspension, kit  . [DISCONTINUED] Influenza Vac A&B Surf Ant Adj (FLUAD) 0.5 ML SUSY Fluad 2019-20 45yrup(PF)45 mcg(15 mcgx3)/0.5 mL intramuscular syringe  ADM 0.5ML IM UTD   No facility-administered encounter medications on file as of 08/25/2019.     Activities of Daily Living In your present state of health, do you have any difficulty performing the following activities: 08/25/2019  Hearing? N  Vision? N  Difficulty concentrating or making decisions? N  Walking or climbing stairs? N  Dressing or bathing? N  Doing errands, shopping? N  Preparing Food and eating ? N  Using the Toilet? N  In the past six months, have you accidently leaked urine? N  Do you have problems with loss of bowel control? N  Managing your Medications? N  Managing your Finances? N  Housekeeping or managing your Housekeeping? N  Some recent data might be hidden    Patient Care Team: BEulas Post MD as PCP - General BLeeroy Cha MD as Attending Physician (Neurosurgery)   Assessment:   This is a routine wellness examination for RAlbin  Exercise Activities and Dietary recommendations  Current Exercise Habits: The patient does not participate in regular exercise at present, Exercise limited by: None identified  Goals    . patient     Continue fishing and reducing stress     . Patient Stated     To maintain health  Catch the big one!       Fall Risk Fall Risk  08/25/2019 07/29/2018 04/03/2018 07/24/2017 12/19/2016  Falls in the past year? 1 1 Yes No No  Comment - working in garden; leaned on something unstable;  stubbed tub on edge of bed  - - -  Number falls in past yr: 0 1 1 - -  Comment tripped over a log in the back yard; no injury - - - -  Injury with Fall? - - No - -  Risk for fall due to : History of fall(s) - Other (Comment) - -  Follow up Falls evaluation completed;Education provided;Falls prevention discussed - Falls evaluation completed;Education provided;Falls prevention discussed - -   Is the patient's home free of loose throw rugs in walkways, pet beds, electrical cords, etc?   yes      Grab bars in the bathroom? yes      Handrails on the stairs?   yes      Adequate lighting?   yes  Timed Get Up and Go Performed: N/A due to telephone visit  Depression Screen PHQ 2/9 Scores 08/25/2019 07/29/2018 07/24/2017 12/19/2016  PHQ - 2 Score 0 0 0 0    Cognitive Function MMSE - Mini Mental State Exam 07/29/2018 07/29/2018  Not completed: (No Data) (No Data)     Cognitive Testing  Alert? Yes         Normal Appearance? N/A due to telephone visit Oriented to person? Yes           Place? Yes  Time? Yes  Recall of three objects? Yes  Can perform simple calculations? Yes  Displays appropriate judgment? Yes   No cognitive concerns at this time    Immunization History  Administered Date(s) Administered  . Influenza, High Dose Seasonal PF 06/22/2014, 05/17/2019, 05/17/2019  . Influenza-Unspecified 07/19/2015, 06/20/2016, 06/10/2017, 06/09/2018  .  Pneumococcal Conjugate-13 10/26/2013  . Pneumococcal Polysaccharide-23 02/16/2007, 10/31/2015  . Td 10/18/2004   . Tdap 12/25/2013  . Zoster 02/16/2007  . Zoster Recombinat (Shingrix) 05/08/2018, 07/20/2018    Qualifies for Shingles Vaccine? Patient completed series of two  Screening Tests Health Maintenance  Topic Date Due  . Hepatitis C Screening  1945/11/10  . TETANUS/TDAP  12/26/2023  . COLONOSCOPY  06/21/2024  . INFLUENZA VACCINE  Completed  . PNA vac Low Risk Adult  Completed   Cancer Screenings: Lung: Low Dose CT Chest recommended if Age 65-80 years, 30 pack-year currently smoking OR have quit w/in 15years. Patient does not qualify. 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 He continues to fish when weather permits and stays active doing yard work. He defers an appointment at this time and will schedule when the pandemic situation improves. He expressed interest in receiving the covid vaccine when available.   I have personally reviewed and noted the following in the patient's chart:   . Medical and social history . Use of alcohol, tobacco or illicit drugs  . Current medications and supplements . Functional ability and status . Nutritional status . Physical activity . Advanced directives . List of other physicians . Hospitalizations, surgeries, and ER visits in previous 12 months . Vitals . Screenings to include cognitive, depression, and falls . Referrals and appointments  In addition, I have reviewed and discussed with patient certain preventive protocols, quality metrics, and best practice recommendations. A written personalized care plan for preventive services as well as general preventive health recommendations were provided to patient.     Franne Forts, LPN  [id number]

## 2019-09-22 ENCOUNTER — Telehealth: Payer: Self-pay | Admitting: Physical Medicine and Rehabilitation

## 2019-09-22 NOTE — Telephone Encounter (Signed)
Have I done mychart one before, been a while

## 2019-10-06 ENCOUNTER — Telehealth (INDEPENDENT_AMBULATORY_CARE_PROVIDER_SITE_OTHER): Payer: Medicare Other | Admitting: Physical Medicine and Rehabilitation

## 2019-10-06 ENCOUNTER — Encounter: Payer: Self-pay | Admitting: Physical Medicine and Rehabilitation

## 2019-10-06 ENCOUNTER — Ambulatory Visit: Payer: Medicare Other | Attending: Internal Medicine

## 2019-10-06 ENCOUNTER — Other Ambulatory Visit: Payer: Self-pay | Admitting: Physical Medicine and Rehabilitation

## 2019-10-06 DIAGNOSIS — M25551 Pain in right hip: Secondary | ICD-10-CM

## 2019-10-06 DIAGNOSIS — M545 Low back pain: Secondary | ICD-10-CM

## 2019-10-06 DIAGNOSIS — Z23 Encounter for immunization: Secondary | ICD-10-CM

## 2019-10-06 DIAGNOSIS — M961 Postlaminectomy syndrome, not elsewhere classified: Secondary | ICD-10-CM | POA: Diagnosis not present

## 2019-10-06 DIAGNOSIS — G8929 Other chronic pain: Secondary | ICD-10-CM

## 2019-10-06 DIAGNOSIS — M48062 Spinal stenosis, lumbar region with neurogenic claudication: Secondary | ICD-10-CM

## 2019-10-06 MED ORDER — MELOXICAM 15 MG PO TABS: 15.0000 mg | ORAL_TABLET | Freq: Every day | ORAL | 0 refills | Status: DC

## 2019-10-06 MED ORDER — PREDNISONE 50 MG PO TABS: ORAL_TABLET | ORAL | 0 refills | Status: DC

## 2019-10-06 NOTE — Progress Notes (Signed)
   Covid-19 Vaccination Clinic  Name:  Micheal [name]    MRN: [id number] DOB: 1945/12/20  10/06/2019  Micheal [name] was observed post Covid-19 immunization for 15 minutes without incidence. He was provided with Vaccine Information Sheet and instruction to access the V-Safe system.   Micheal [name] was instructed to call 911 with any severe reactions post vaccine: Marland Kitchen Difficulty breathing  . Swelling of your face and throat  . A fast heartbeat  . A bad rash all over your body  . Dizziness and weakness    Immunizations Administered    Name Date Dose VIS Date Route   Pfizer COVID-19 Vaccine 10/06/2019  3:46 PM 0.3 mL 08/28/2019 Intramuscular   Manufacturer: [place]   Lot: [id number]   [place]: [id number]

## 2019-10-06 NOTE — Telephone Encounter (Signed)
I will OK the rx but please message or call him that I do not want him taking everyday for 90 days, I want him to take daily for 2 to 3 weeks at most

## 2019-10-06 NOTE — Progress Notes (Signed)
  Numeric Pain Rating Scale and Functional Assessment Average Pain 6   In the last MONTH (on 0-10 scale) has pain interfered with the following?  1. General activity like being  able to carry out your everyday physical activities such as walking, climbing stairs, carrying groceries, or moving a chair?  Rating(5)   

## 2019-10-06 NOTE — Telephone Encounter (Signed)
Called patient to advise  °

## 2019-10-07 ENCOUNTER — Encounter: Payer: Self-pay | Admitting: Physical Medicine and Rehabilitation

## 2019-10-07 NOTE — Progress Notes (Signed)
Micheal [name] - 74 y.o. male MRN [id number]  Date of birth: 1945/12/11  Virtual Visit via Video Note  I connected with Micheal [name] on 10/06/2019 at  9:30 AM EST by a video enabled telemedicine application and verified that I am speaking with the correct person using two identifiers.   I discussed the limitations of evaluation and management by telemedicine and the availability of in person appointments. The patient expressed understanding and agreed to proceed.  Visit Date: 10/06/2019 PCP: [name], MD Referred by: [name], MD  Subjective: Chief Complaint  Patient presents with  . Back Pain  . Hip Pain   HPI: Micheal [name] is a 74 y.o. male. He is complaining of recent several month episode of worsening back and hip pain.  He tells me a story of doing some work around the yard and at least on one occasion on the left side having a period of groin pain and then afterwards having a lot of right hip pain referred to the lateral hip and anterior part of the thigh to the knee.  The groin pain on the left side was fleeting and really went away pretty fast and has not returned.  His pain is now posterior lateral on the right.  Its not going down the back of the leg like he has had in the past.  He reports the pain is worse with standing and walking and also lying down at night.  His pain is better with sitting.  He rates his overall pain is 6 out of 10.  He tried some old meloxicam as well as oxycodone.  He states the oxycodone helped him sleep at night.  We have seen him in the past and completed epidural injection which was not very beneficial.  He has had lumbar laminectomy decompression in the past by Dr. [name] I believe.  The last time I saw him was in March he was having more L5 type symptoms down into the leg.  He is not really having that at this point.  He does have a diagnosis of neuropathy.  We did suggest that the time because of the fact that it did not  seem to help that he probably needed updated MRI of the lumbar spine.  MRI of the lumbar spine is reviewed with him again today.  It is reviewed below.  The MRI shows mainly left-sided features and prior surgery.  No high-grade stenosis or nerve compression.  This was in 2018.  He denies any pain with hip movement.  Getting in and out of car is okay and stairs seem to be okay.  He has had no numbness tingling paresthesias no focal weakness.  No other changes.  He reports that his wife is a type I diabetic and they have been very fearful of really going anywhere because of the coronavirus.  He also states they are getting their vaccination tomorrow for at least the initial injection.  He has had no recent trauma or red flag complaints.  He did note that his blood pressure has been high lately.  ROS Otherwise per HPI.  Objective/Observation:  Patient was in no acute distress could stand and ambulate and had no focal weakness.  Assessment & Plan: Visit Diagnoses:  1. Spinal stenosis of lumbar region with neurogenic claudication   2. Pain in right hip   3. Chronic right-sided low back pain without sciatica   4. Post laminectomy syndrome     Plan:  Findings:  Chronic history of back pain status post lumbar laminectomies years ago and now with what appears to be more of an L2 or L3 radicular type pain more on the right.  He is getting some level of claudication type symptoms with walking.  I cannot rule out the fact that it could be arthritis of his right hip.  There are no recent imaging of the right hip.  Brief ability to have him do some maneuvers during the interview did not really elicit any hip or groin pain on the right.  The groin pain on the left was very fleeting.  Lumbar spine MRI from 2018 did show moderate stenosis at L2-3 which could account for his symptoms.  I do not think a new MRI is needed given the fact that it seems to be consistent with that finding at least at this point.  The best  approach because he does not really want to come in necessarily for an injection or other treatment because of the coronavirus at this point is to start a course of prednisone just for a few days along with consistent use of meloxicam daily just for short while maybe 2 to 3 weeks.  I have also asked him to add Tylenol 3 times a day extra strength 1 tablet.  He could try glucosamine if he would like as well.  He does complain of other osteoarthritic conditions.  He is getting his coronavirus vaccine tomorrow.  Given that I would have him hold off from the prednisone for about a week or so.  There is no unfortunately data suggesting changes in steroid use or how long to wait but I think at this point let him wait for at least a week before starting that small prednisone dosage for a few days.  Depending on how things go I would see him back in the office for probably epidural injection at L2 or L3.    Meds & Orders:  Meds ordered this encounter  Medications  . predniSONE (DELTASONE) 50 MG tablet    Sig: Take 1 tablet daily with food for 5 days until finished    Dispense:  5 tablet    Refill:  0  . DISCONTD: meloxicam (MOBIC) 15 MG tablet    Sig: Take 1 tablet (15 mg total) by mouth daily. Take with food    Dispense:  30 tablet    Refill:  0   No orders of the defined types were placed in this encounter.   Follow-up: No follow-ups on file.  Follow Up Instructions:    I discussed the assessment and treatment plan with the patient. The patient was provided an opportunity to ask questions and all were answered. The patient agreed with the plan and demonstrated an understanding of the instructions.   The patient was advised to call back or seek an in-person evaluation if the symptoms worsen or if the condition fails to improve as anticipated.  I provided 25 minutes of non-face-to-face time during this encounter.   Laurence Spates, MD    Clinical History: NCV & EMG Findings: 05/01/2018 Extensive  electrodiagnostic testing of the right lower extremity and additional studies of the left shows:  1. Bilateral sural and superficial peroneal sensory responses are within normal limits. 2. Left peroneal motor response shows reduced amplitude at the extensor digitorum brevis, and is asymmetrically reduced at the tibialis anterior when compared to the right. Bilateral tibial and right peroneal motor responses are within normal limits. 3. Bilateral tibial  H reflex studies are within normal limits. 4. Chronic motor axon loss changes are seen affecting the L5 myotome on the left, without accompanied active denervation.  Impression: 1. 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 2. There is no evidence of a large fiber sensorimotor polyneuropathy affecting the lower extremities.    ___________________________ Narda Amber, DO  MRI LUMBAR SPINE WITHOUT AND WITH CONTRAST   L2-L3: Disc desiccation and small bulge with severe bilateral facet hypertrophy resulting in moderate narrowing of the central spinal canal that has progressed slightly from the prior study. No neural foraminal stenosis.  L3-L4: Disc space narrowing and desiccation with prior laminotomy on the left. No spinal canal stenosis. Moderate right and severe left neural foraminal stenosis, unchanged.  L4-L5: Postsurgical changes. Severe bilateral facet hypertrophy and small disc bulge. No central spinal canal stenosis. Bilateral lateral recess narrowing, unchanged. Left foraminal disc protrusion it is decreased in size. Mild left foraminal narrowing with small amount of granulation tissue.  L5-S1: Disc space narrowing and endplate spurring with small central disc osteophyte complex. No spinal canal stenosis. No neural foraminal stenosis.  Visualized sacrum: Normal.  IMPRESSION: 1. Status post partial left facetectomy at L4-L5 with improved patency of the neural  foramen. Mild persistent foraminal narrowing secondary to small amount of granulation tissue. 2. Mild progression of disc and facet degeneration at L2-L3 with resulting moderate spinal canal stenosis. 3. Unchanged postsurgical appearance at L3-L4 with moderate right and severe left foraminal stenosis. 4. Multilevel severe facet arthrosis.   Electronically Signed By: Ulyses Jarred M.D. On: 11/18/2016 23:08 MRI THORACIC SPINE WITHOUT CONTRAST  TECHNIQUE: Multiplanar, multisequence MR imaging of the thoracic spine was performed. No intravenous contrast was administered.  COMPARISON: Chest radiography 09/11/2017.  FINDINGS: Alignment: Normal  Vertebrae: No fracture or primary bone lesion.  Cord: No cord compression or primary cord lesion.  Paraspinal and other soft tissues: Normal  Disc levels:  Cervical scout study shows degenerative spondylosis at C4-5, C5-6 and C6-7.  T1-2: Normal.  T2-3: Normal.  T3-4: Shallow left-sided disc bulge. No stenosis or neural compression.  T4-5: Shallow left-sided disc bulge. No stenosis or neural compression.  T5-6: Endplate osteophytes and bulging of the disc. Narrowing of the ventral subarachnoid space but no compressive effect upon the canal or foramina.  T6-7: Left-sided disc bulge. No stenosis or neural compression.  T7-8: Mild bulging of the disc. No stenosis or neural compression.  T8-9: Mild bulging of the disc. No stenosis or neural compression.  T9-10: Normal interspace.  T10-11: Left paracentral disc bulge. No stenosis or neural compression.  T11-12: Normal interspace.  T12-L1: Normal interspace.  No significant facet arthropathy.  IMPRESSION: No cause of the presenting symptoms is identified. Mild degenerative changes of the spine for a person of this age. No advanced disease, neural compressive findings or active bone or joint pathology seen.   Electronically Signed By: Nelson Chimes M.D. On: 09/25/2017 08:40   He reports that he has never smoked. He has never used smokeless tobacco. No results for input(s): HGBA1C, LABURIC in the last 8760 hours.  Past Medical/Family/Surgical/Social History: Medications & Allergies reviewed per EMR, new medications updated. Patient Active Problem List   Diagnosis Date Noted  . Right-sided chest wall pain 03/27/2018  . Pain of right scapula 03/27/2018  . Spinal stenosis of cervical region 03/27/2018  . Knee pain, bilateral 12/18/2016  . Lumbar degenerative disc disease 12/18/2016  . OA (osteoarthritis) of knee 04/02/2016  . Synovitis of knee 12/08/2014  .  Peripheral neuropathy 11/11/2013  . Chronic anxiety 10/26/2013  . Plantar fasciitis of left foot 01/01/2013  . Osteoarthritis of right knee 01/01/2013  . Tear, knee, lateral collateral ligament 01/01/2013  . INSOMNIA, CHRONIC 10/17/2010  . FATIGUE 10/17/2010  . Tremor, essential 07/20/2010  . Hyperlipidemia 03/03/2010  . Essential hypertension 03/03/2010  . ADENOCARCINOMA, PROSTATE, HX OF 03/03/2010  . NEPHROLITHIASIS, HX OF 03/03/2010   Past Medical History:  Diagnosis Date  . Anxiety   . Benign essential tremor    hands  . Complication of anesthesia 1999   spinal with last knee replacement  . Diverticulitis    last Saturday 03/24/16 -tx with antibiotics  . GERD (gastroesophageal reflux disease)   . H/O balanitis    recurrent intermittant-- uses diprolene cream prn  . History of colon polyps   . History of kidney stones   . History of prostate cancer urologist-  dr Alinda Money-  currently PSA nondetectable 10/ 2016   s/p  radial prostatectomy w/ nerve sparing 07-17-2007/   stage T1c,  Gleason 3+3=6,  PSA 6.33  . Hyperlipidemia   . Hypertension   . Mild obstructive sleep apnea    uses mouth guard only  . Nocturia   . Osteoarthritis    knees  . Sigmoid diverticulosis   . Synovial hypertrophy of left knee   . Urge urinary incontinence    Family History    Problem Relation Age of Onset  . Sudden death Mother   . Aneurysm Father   . Heart attack Other        grandfather   Past Surgical History:  Procedure Laterality Date  . BACK SURGERY     L4 surgery  . CARDIOVASCULAR STRESS TEST  08-04-2014   normal perfusion nuclear study/  normal LV function and wall motion , ef 54%  . COLONOSCOPY W/ POLYPECTOMY  06-21-2014  . JOINT REPLACEMENT    . KNEE ARTHROSCOPY Bilateral right 07-31-2009//  left   . KNEE ARTHROSCOPY Left 12/08/2014   Procedure: ARTHROSCOPY LEFT KNEE WITH SYNOVECTOMY;  Surgeon: Gaynelle Arabian, MD;  [facility];  Service: Orthopedics;  Laterality: Left;  . KNEE ARTHROSCOPY Left 02/14/2016   Procedure: ARTHROSCOPY KNEE WTH SYNOVECTOMY;  Surgeon: Gaynelle Arabian, MD;  Location: [facility];  Service: Orthopedics;  Laterality: Left;  . LUMBAR DISC SURGERY  04-13-2002   left  L4 -- L5  . ROBOT ASSISTED LAPAROSCOPIC RADICAL PROSTATECTOMY  07-17-2007  . TONSILLECTOMY  as child  . TOTAL KNEE ARTHROPLASTY Left 03-08-2008  . TOTAL KNEE ARTHROPLASTY Right 04/02/2016   Procedure: RIGHT TOTAL KNEE ARTHROPLASTY;  Surgeon: Gaynelle Arabian, MD;  Location: [facility];  Service: Orthopedics;  Laterality: Right;   Social History   Occupational History  . 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 Smoking status: Never Smoker  . Smokeless tobacco: Never Used  Substance and Sexual Activity  . Alcohol use: Yes    Comment: social 1-2 weekly  . Drug use: No  . Sexual activity: Not on file

## 2019-10-08 ENCOUNTER — Encounter: Payer: Self-pay | Admitting: Physical Medicine and Rehabilitation

## 2019-10-12 ENCOUNTER — Encounter: Payer: Self-pay | Admitting: Family Medicine

## 2019-10-13 ENCOUNTER — Telehealth (INDEPENDENT_AMBULATORY_CARE_PROVIDER_SITE_OTHER): Payer: Medicare Other | Admitting: Family Medicine

## 2019-10-13 ENCOUNTER — Other Ambulatory Visit: Payer: Self-pay

## 2019-10-13 DIAGNOSIS — I10 Essential (primary) hypertension: Secondary | ICD-10-CM | POA: Diagnosis not present

## 2019-10-13 NOTE — Progress Notes (Signed)
This visit type was conducted due to national recommendations for restrictions regarding the COVID-19 pandemic in an effort to limit this patient's exposure and mitigate transmission in our community.   Virtual Visit via Video Note  I connected with Micheal [name] on 10/13/19 at 10:15 AM EST by a video enabled telemedicine application and verified that I am speaking with the correct person using two identifiers.  Location patient: home Location provider:work or home office Persons participating in the virtual visit: patient, provider  I discussed the limitations of evaluation and management by telemedicine and the availability of in person appointments. The patient expressed understanding and agreed to proceed.   HPI: Micheal [name] had called yesterday with some elevated blood pressure readings.  He had actually a few weeks now of readings consistently 364 systolic and 68E and 32Z diastolic.  He had 1 elevated reading 179/103.  He has been doing some chronic back issues and is seeing another physician who recently prescribed meloxicam.  He just started taking that.  He wonders if his chronic back pain is elevating his blood pressure somewhat.  He does not drink alcohol consistently.  He is not complain of any recent dizziness, headaches, or chest pain.  He takes Benicar 20 mg daily which has been on for several years.  He is compliant with therapy.   ROS: See pertinent positives and negatives per HPI.  Past Medical History:  Diagnosis Date  . Anxiety   . Benign essential tremor    hands  . Complication of anesthesia 1999   spinal with last knee replacement  . Diverticulitis    last Saturday 03/24/16 -tx with antibiotics  . GERD (gastroesophageal reflux disease)   . H/O balanitis    recurrent intermittant-- uses diprolene cream prn  . History of colon polyps   . History of kidney stones   . History of prostate cancer urologist-  dr Alinda Money-  currently PSA nondetectable 10/ 2016   s/p  radial  prostatectomy w/ nerve sparing 07-17-2007/   stage T1c,  Gleason 3+3=6,  PSA 6.33  . Hyperlipidemia   . Hypertension   . Mild obstructive sleep apnea    uses mouth guard only  . Nocturia   . Osteoarthritis    knees  . Sigmoid diverticulosis   . Synovial hypertrophy of left knee   . Urge urinary incontinence     Past Surgical History:  Procedure Laterality Date  . BACK SURGERY     L4 surgery  . CARDIOVASCULAR STRESS TEST  08-04-2014   normal perfusion nuclear study/  normal LV function and wall motion , ef 54%  . COLONOSCOPY W/ POLYPECTOMY  06-21-2014  . JOINT REPLACEMENT    . KNEE ARTHROSCOPY Bilateral right 07-31-2009//  left   . KNEE ARTHROSCOPY Left 12/08/2014   Procedure: ARTHROSCOPY LEFT KNEE WITH SYNOVECTOMY;  Surgeon: Gaynelle Arabian, MD;  Location[facility];  Service: Orthopedics;  Laterality: Left;  . KNEE ARTHROSCOPY Left 02/14/2016   Procedure: ARTHROSCOPY KNEE WTH SYNOVECTOMY;  Surgeon: Gaynelle Arabian, MD;  Location: [facility];  Service: Orthopedics;  Laterality: Left;  . LUMBAR DISC SURGERY  04-13-2002   left  L4 -- L5  . ROBOT ASSISTED LAPAROSCOPIC RADICAL PROSTATECTOMY  07-17-2007  . TONSILLECTOMY  as child  . TOTAL KNEE ARTHROPLASTY Left 03-08-2008  . TOTAL KNEE ARTHROPLASTY Right 04/02/2016   Procedure: RIGHT TOTAL KNEE ARTHROPLASTY;  Surgeon: Gaynelle Arabian, MD;  Location: [facility];  Service: Orthopedics;  Laterality: Right;    Family  History  Problem Relation Age of Onset  . Sudden death Mother   . Aneurysm Father   . Heart attack Other        grandfather    SOCIAL HX: Non-smoker   Current Outpatient Medications:  .  acetaminophen (TYLENOL) 500 MG tablet, Take 1,000 mg by mouth every 6 (six) hours as needed for mild pain. , Disp: , Rfl:  .  aspirin EC 81 MG tablet, Take 81 mg by mouth daily., Disp: , Rfl:  .  atorvastatin (LIPITOR) 20 MG tablet, TAKE 1 TABLET BY MOUTH  DAILY AT 6 PM, Disp: 90 tablet, Rfl: 0 .   diphenhydrAMINE (BENADRYL) 25 MG tablet, Take 25 mg by mouth at bedtime as needed for sleep., Disp: , Rfl:  .  gabapentin (NEURONTIN) 600 MG tablet, Take 1 tablet (600 mg total) by mouth 3 (three) times daily. Slow taper off as directed. (Patient not taking: Reported on 08/25/2019), Disp: 120 tablet, Rfl: 1 .  magnesium 30 MG tablet, Take 30 mg by mouth daily., Disp: , Rfl:  .  meloxicam (MOBIC) 15 MG tablet, TAKE 1 TABLET(15 MG) BY MOUTH DAILY WITH FOOD, Disp: 90 tablet, Rfl: 0 .  olmesartan (BENICAR) 20 MG tablet, TAKE 1 TABLET BY MOUTH  DAILY, Disp: 90 tablet, Rfl: 0 .  predniSONE (DELTASONE) 50 MG tablet, Take 1 tablet daily with food for 5 days until finished, Disp: 5 tablet, Rfl: 0 .  sertraline (ZOLOFT) 50 MG tablet, TAKE 1 TABLET BY MOUTH  DAILY, Disp: 90 tablet, Rfl: 0 .  Zoster Vaccine Adjuvanted (SHINGRIX) injection, Shingrix (PF) 50 mcg/0.5 mL intramuscular suspension, kit, Disp: , Rfl:   EXAM:  VITALS per patient if applicable:  GENERAL: alert, oriented, appears well and in no acute distress  HEENT: atraumatic, conjunttiva clear, no obvious abnormalities on inspection of external nose and ears  NECK: normal movements of the head and neck  LUNGS: on inspection no signs of respiratory distress, breathing rate appears normal, no obvious gross SOB, gasping or wheezing  CV: no obvious cyanosis  MS: moves all visible extremities without noticeable abnormality  PSYCH/NEURO: pleasant and cooperative, no obvious depression or anxiety, speech and thought processing grossly intact  ASSESSMENT AND PLAN:  Discussed the following assessment and plan:  Hypertension.  Poorly controlled by several home readings over many days and also by elevated readings when he had seen physiatrist recently  -Add amlodipine 5 mg daily and continue Benicar 20 mg daily -Limit non-steroidals as much as possible -Set up 3-week follow-up to reassess     I discussed the assessment and treatment plan  with the patient. The patient was provided an opportunity to ask questions and all were answered. The patient agreed with the plan and demonstrated an understanding of the instructions.   The patient was advised to call back or seek an in-person evaluation if the symptoms worsen or if the condition fails to improve as anticipated.     Carolann Littler, MD

## 2019-10-14 ENCOUNTER — Other Ambulatory Visit: Payer: Self-pay

## 2019-10-14 ENCOUNTER — Telehealth: Payer: Self-pay | Admitting: Family Medicine

## 2019-10-14 MED ORDER — AMLODIPINE BESYLATE 5 MG PO TABS: 5.0000 mg | ORAL_TABLET | Freq: Every day | ORAL | 5 refills | Status: DC

## 2019-10-14 NOTE — Telephone Encounter (Signed)
Called patient and let him know that I have sent in his new medication to the requested pharmacy and scheduled his follow up for 8:30am on 11/03/19. Patient prefers to do a virtual visit. Patient verbalized an understanding.

## 2019-10-14 NOTE — Telephone Encounter (Signed)
Please see message. Per last OV note, Amlodipine 5 mg. Okay to send 30 with 1 refill?

## 2019-10-14 NOTE — Telephone Encounter (Signed)
Pt check with his pharmacy ([facility] on [address], [place], [place] [address]) and the medication amlodipine has not been sent in yet. He just wanted to remind the provider for it to be sent in

## 2019-10-14 NOTE — Telephone Encounter (Signed)
Yes.   Sorry I must have gotten distracted and not sent.  Go ahead and give 5 refills and make sure he has follow up.

## 2019-10-16 ENCOUNTER — Encounter: Payer: Self-pay | Admitting: Physical Medicine and Rehabilitation

## 2019-10-19 ENCOUNTER — Encounter: Payer: Self-pay | Admitting: Family Medicine

## 2019-10-24 ENCOUNTER — Ambulatory Visit: Payer: Medicare Other

## 2019-10-27 ENCOUNTER — Ambulatory Visit: Payer: Medicare Other | Attending: Internal Medicine

## 2019-10-27 DIAGNOSIS — Z23 Encounter for immunization: Secondary | ICD-10-CM | POA: Insufficient documentation

## 2019-10-27 NOTE — Progress Notes (Signed)
   Covid-19 Vaccination Clinic  Name:  CYE EINSTEIN    MRN: [id number] DOB: May 12, 1946  10/27/2019  Mr. Rodriges was observed post Covid-19 immunization for 15 minutes without incidence. He was provided with Vaccine Information Sheet and instruction to access the V-Safe system.   Mr. Loiacono was instructed to call 911 with any severe reactions post vaccine: Marland Kitchen Difficulty breathing  . Swelling of your face and throat  . A fast heartbeat  . A bad rash all over your body  . Dizziness and weakness    Immunizations Administered    Name Date Dose VIS Date Route   Pfizer COVID-19 Vaccine 10/27/2019  2:10 PM 0.3 mL 08/28/2019 Intramuscular   Manufacturer: [place]   Lot: [id number]   [place]: [id number]

## 2019-10-28 ENCOUNTER — Ambulatory Visit: Payer: Medicare Other

## 2019-10-30 ENCOUNTER — Ambulatory Visit: Payer: Medicare Other

## 2019-11-02 ENCOUNTER — Encounter: Payer: Self-pay | Admitting: Family Medicine

## 2019-11-03 ENCOUNTER — Telehealth (INDEPENDENT_AMBULATORY_CARE_PROVIDER_SITE_OTHER): Payer: Medicare Other | Admitting: Family Medicine

## 2019-11-03 ENCOUNTER — Other Ambulatory Visit: Payer: Self-pay

## 2019-11-03 DIAGNOSIS — Z8546 Personal history of malignant neoplasm of prostate: Secondary | ICD-10-CM | POA: Diagnosis not present

## 2019-11-03 DIAGNOSIS — I10 Essential (primary) hypertension: Secondary | ICD-10-CM | POA: Diagnosis not present

## 2019-11-03 DIAGNOSIS — E785 Hyperlipidemia, unspecified: Secondary | ICD-10-CM

## 2019-11-03 DIAGNOSIS — R7303 Prediabetes: Secondary | ICD-10-CM

## 2019-11-03 MED ORDER — AMLODIPINE BESYLATE 5 MG PO TABS: 5.0000 mg | ORAL_TABLET | Freq: Every day | ORAL | 3 refills | Status: DC

## 2019-11-03 MED ORDER — OLMESARTAN MEDOXOMIL 20 MG PO TABS: 20.0000 mg | ORAL_TABLET | Freq: Every day | ORAL | 3 refills | Status: DC

## 2019-11-03 NOTE — Progress Notes (Signed)
This visit type was conducted due to national recommendations for restrictions regarding the COVID-19 pandemic in an effort to limit this patient's exposure and mitigate transmission in our community.   Virtual Visit via Video Note  I connected with Micheal [name] on 11/03/19 at  8:30 AM EST by a video enabled telemedicine application and verified that I am speaking with the correct person using two identifiers.  Location patient: home Location provider:work or home office Persons participating in the virtual visit: patient, provider  I discussed the limitations of evaluation and management by telemedicine and the availability of in person appointments. The patient expressed understanding and agreed to proceed.   HPI:  Micheal [name] had called for follow-up regarding hypertension.  He has been on Benicar 20 mg daily for several years and had several elevated systolic readings recently we added amlodipine 5 mg daily.  He is taking this without side effect.  He sent in some readings yesterday and these have been consistently well controlled with systolics less than 825 and diastolics less than 90.  He had been recently taking nonsteroidal and discontinued that and also discontinued Benadryl.  He has had some ongoing back difficulties which have limited his exercise and walking.  He has managed to get his weight down from around 200 pounds 185 pounds.  He would like to lose more.  He has hyperlipidemia treated with atorvastatin.  He is overdue for labs.  He has past history of prostate cancer.  He would like to get follow-up PSA.  Has had prediabetes range blood sugars in the past   ROS: See pertinent positives and negatives per HPI.  Past Medical History:  Diagnosis Date  . Anxiety   . Benign essential tremor    hands  . Complication of anesthesia 1999   spinal with last knee replacement  . Diverticulitis    last Saturday 03/24/16 -tx with antibiotics  . GERD (gastroesophageal reflux disease)   . H/O  balanitis    recurrent intermittant-- uses diprolene cream prn  . History of colon polyps   . History of kidney stones   . History of prostate cancer urologist-  dr Alinda Money-  currently PSA nondetectable 10/ 2016   s/p  radial prostatectomy w/ nerve sparing 07-17-2007/   stage T1c,  Gleason 3+3=6,  PSA 6.33  . Hyperlipidemia   . Hypertension   . Mild obstructive sleep apnea    uses mouth guard only  . Nocturia   . Osteoarthritis    knees  . Sigmoid diverticulosis   . Synovial hypertrophy of left knee   . Urge urinary incontinence     Past Surgical History:  Procedure Laterality Date  . BACK SURGERY     L4 surgery  . CARDIOVASCULAR STRESS TEST  08-04-2014   normal perfusion nuclear study/  normal LV function and wall motion , ef 54%  . COLONOSCOPY W/ POLYPECTOMY  06-21-2014  . JOINT REPLACEMENT    . KNEE ARTHROSCOPY Bilateral right 07-31-2009//  left   . KNEE ARTHROSCOPY Left 12/08/2014   Procedure: ARTHROSCOPY LEFT KNEE WITH SYNOVECTOMY;  Surgeon: Gaynelle Arabian, MD;  [facility];  Service: Orthopedics;  Laterality: Left;  . KNEE ARTHROSCOPY Left 02/14/2016   Procedure: ARTHROSCOPY KNEE WTH SYNOVECTOMY;  Surgeon: Gaynelle Arabian, MD;  Location: [facility];  Service: Orthopedics;  Laterality: Left;  . LUMBAR DISC SURGERY  04-13-2002   left  L4 -- L5  . ROBOT ASSISTED LAPAROSCOPIC RADICAL PROSTATECTOMY  07-17-2007  . TONSILLECTOMY  as child  . TOTAL KNEE ARTHROPLASTY Left 03-08-2008  . TOTAL KNEE ARTHROPLASTY Right 04/02/2016   Procedure: RIGHT TOTAL KNEE ARTHROPLASTY;  Surgeon: Gaynelle Arabian, MD;  Location: [facility];  Service: Orthopedics;  Laterality: Right;    Family History  Problem Relation Age of Onset  . Sudden death Mother   . Aneurysm Father   . Heart attack Other        grandfather    SOCIAL HX: non-smoker   Current Outpatient Medications:  .  acetaminophen (TYLENOL) 500 MG tablet, Take 1,000 mg by mouth every 6 (six) hours as  needed for mild pain. , Disp: , Rfl:  .  amLODipine (NORVASC) 5 MG tablet, Take 1 tablet (5 mg total) by mouth daily., Disp: 30 tablet, Rfl: 5 .  aspirin EC 81 MG tablet, Take 81 mg by mouth daily., Disp: , Rfl:  .  atorvastatin (LIPITOR) 20 MG tablet, TAKE 1 TABLET BY MOUTH  DAILY AT 6 PM, Disp: 90 tablet, Rfl: 0 .  diphenhydrAMINE (BENADRYL) 25 MG tablet, Take 25 mg by mouth at bedtime as needed for sleep., Disp: , Rfl:  .  gabapentin (NEURONTIN) 600 MG tablet, Take 1 tablet (600 mg total) by mouth 3 (three) times daily. Slow taper off as directed. (Patient not taking: Reported on 08/25/2019), Disp: 120 tablet, Rfl: 1 .  magnesium 30 MG tablet, Take 30 mg by mouth daily., Disp: , Rfl:  .  olmesartan (BENICAR) 20 MG tablet, TAKE 1 TABLET BY MOUTH  DAILY, Disp: 90 tablet, Rfl: 0 .  predniSONE (DELTASONE) 50 MG tablet, Take 1 tablet daily with food for 5 days until finished, Disp: 5 tablet, Rfl: 0 .  sertraline (ZOLOFT) 50 MG tablet, TAKE 1 TABLET BY MOUTH  DAILY, Disp: 90 tablet, Rfl: 0 .  Zoster Vaccine Adjuvanted (SHINGRIX) injection, Shingrix (PF) 50 mcg/0.5 mL intramuscular suspension, kit, Disp: , Rfl:   EXAM:  VITALS per patient if applicable:  GENERAL: alert, oriented, appears well and in no acute distress  HEENT: atraumatic, conjunttiva clear, no obvious abnormalities on inspection of external nose and ears  NECK: normal movements of the head and neck  LUNGS: on inspection no signs of respiratory distress, breathing rate appears normal, no obvious gross SOB, gasping or wheezing  CV: no obvious cyanosis  MS: moves all visible extremities without noticeable abnormality  PSYCH/NEURO: pleasant and cooperative, no obvious depression or anxiety, speech and thought processing grossly intact  ASSESSMENT AND PLAN:  Discussed the following assessment and plan:  #1 hypertension improved with recent addition of amlodipine.  Will refill both medications for 1 year to [facility]  #2  dyslipidemia treated with atorvastatin.  Will place order for future labs and he will return in a couple weeks for that  #3 history of prostate cancer.  We will recheck PSA  #4 history of prediabetes range blood sugars.  We will add A1c to future lab order     I discussed the assessment and treatment plan with the patient. The patient was provided an opportunity to ask questions and all were answered. The patient agreed with the plan and demonstrated an understanding of the instructions.   The patient was advised to call back or seek an in-person evaluation if the symptoms worsen or if the condition fails to improve as anticipated.     Carolann Littler, MD

## 2019-11-16 ENCOUNTER — Other Ambulatory Visit: Payer: Self-pay

## 2019-11-17 ENCOUNTER — Other Ambulatory Visit (INDEPENDENT_AMBULATORY_CARE_PROVIDER_SITE_OTHER): Payer: Medicare Other

## 2019-11-17 DIAGNOSIS — I10 Essential (primary) hypertension: Secondary | ICD-10-CM | POA: Diagnosis not present

## 2019-11-17 DIAGNOSIS — E785 Hyperlipidemia, unspecified: Secondary | ICD-10-CM

## 2019-11-17 DIAGNOSIS — Z8546 Personal history of malignant neoplasm of prostate: Secondary | ICD-10-CM | POA: Diagnosis not present

## 2019-11-17 DIAGNOSIS — R7303 Prediabetes: Secondary | ICD-10-CM | POA: Diagnosis not present

## 2019-11-17 LAB — BASIC METABOLIC PANEL
BUN: 16 mg/dL (ref 6–23)
CO2: 30 mEq/L (ref 19–32)
Calcium: 9.6 mg/dL (ref 8.4–10.5)
Chloride: 103 mEq/L (ref 96–112)
Creatinine, Ser: 0.84 mg/dL (ref 0.40–1.50)
GFR: 89.64 mL/min (ref >60.00)
Glucose, Bld: 109 mg/dL — ABNORMAL HIGH (ref 70–99)
Potassium: 4.3 mEq/L (ref 3.5–5.1)
Sodium: 139 mEq/L (ref 135–145)

## 2019-11-17 LAB — LIPID PANEL
Cholesterol: 123 mg/dL (ref 0–200)
HDL: 36.6 mg/dL — ABNORMAL LOW (ref >39.00)
LDL Cholesterol: 65 mg/dL (ref 0–99)
NonHDL: 86.35
Total CHOL/HDL Ratio: 3
Triglycerides: 107 mg/dL (ref 0.0–149.0)
VLDL: 21.4 mg/dL (ref 0.0–40.0)

## 2019-11-17 LAB — HEPATIC FUNCTION PANEL
ALT: 24 U/L (ref 0–53)
AST: 27 U/L (ref 0–37)
Albumin: 3.9 g/dL (ref 3.5–5.2)
Alkaline Phosphatase: 83 U/L (ref 39–117)
Bilirubin, Direct: 0.2 mg/dL (ref 0.0–0.3)
Total Bilirubin: 1 mg/dL (ref 0.2–1.2)
Total Protein: 7.3 g/dL (ref 6.0–8.3)

## 2019-11-17 LAB — PSA: PSA: 0 ng/mL — ABNORMAL LOW (ref 0.10–4.00)

## 2019-11-17 LAB — HEMOGLOBIN A1C: Hgb A1c MFr Bld: 6 % (ref 4.6–6.5)

## 2019-11-23 ENCOUNTER — Other Ambulatory Visit: Payer: Self-pay | Admitting: Dermatology

## 2019-11-23 DIAGNOSIS — D485 Neoplasm of uncertain behavior of skin: Secondary | ICD-10-CM | POA: Diagnosis not present

## 2019-11-23 DIAGNOSIS — L820 Inflamed seborrheic keratosis: Secondary | ICD-10-CM | POA: Diagnosis not present

## 2019-11-23 DIAGNOSIS — D229 Melanocytic nevi, unspecified: Secondary | ICD-10-CM | POA: Diagnosis not present

## 2019-11-23 DIAGNOSIS — L570 Actinic keratosis: Secondary | ICD-10-CM | POA: Diagnosis not present

## 2019-11-23 DIAGNOSIS — L821 Other seborrheic keratosis: Secondary | ICD-10-CM | POA: Diagnosis not present

## 2019-12-01 ENCOUNTER — Telehealth: Payer: Self-pay | Admitting: Dermatology

## 2019-12-01 NOTE — Telephone Encounter (Signed)
Patient aware of pathology results.

## 2019-12-01 NOTE — Telephone Encounter (Signed)
Patient called to get pathology results from last visit.  Cell # [id number].   Chart # [id number]

## 2019-12-14 DIAGNOSIS — H5203 Hypermetropia, bilateral: Secondary | ICD-10-CM | POA: Diagnosis not present

## 2019-12-14 DIAGNOSIS — H52203 Unspecified astigmatism, bilateral: Secondary | ICD-10-CM | POA: Diagnosis not present

## 2019-12-14 DIAGNOSIS — H2513 Age-related nuclear cataract, bilateral: Secondary | ICD-10-CM | POA: Diagnosis not present

## 2019-12-16 ENCOUNTER — Telehealth: Payer: Self-pay | Admitting: Physical Medicine and Rehabilitation

## 2019-12-16 NOTE — Telephone Encounter (Signed)
Scheduled for 4/22 at 0900 with driver.

## 2019-12-16 NOTE — Telephone Encounter (Signed)
Right L3 tf esi, if no relief after that then update MRI vs PT

## 2019-12-25 ENCOUNTER — Ambulatory Visit (INDEPENDENT_AMBULATORY_CARE_PROVIDER_SITE_OTHER): Payer: Medicare Other | Admitting: Family Medicine

## 2019-12-25 ENCOUNTER — Encounter: Payer: Self-pay | Admitting: Family Medicine

## 2019-12-25 ENCOUNTER — Other Ambulatory Visit: Payer: Self-pay

## 2019-12-25 VITALS — BP 98/64 | HR 65 | Temp 97.9°F | Wt 188.0 lb

## 2019-12-25 DIAGNOSIS — K4090 Unilateral inguinal hernia, without obstruction or gangrene, not specified as recurrent: Secondary | ICD-10-CM

## 2019-12-25 NOTE — Progress Notes (Signed)
Subjective:     Patient ID: Micheal [name], male   DOB: 30-Apr-1946, 74 y.o.   MRN: [id number]  HPI   Micheal [name] is seen with right inguinal bulge.  He has noticed this for some time but especially during the past week.  Worse with bending or lifting.  He has had some mild to moderate pain intermittently.  No prior history of hernia repair or any previous hernias.  He has had some chronic back difficulties and is followed by neurosurgery for that.  Past Medical History:  Diagnosis Date  . Anxiety   . Benign essential tremor    hands  . Complication of anesthesia 1999   spinal with last knee replacement  . Diverticulitis    last Saturday 03/24/16 -tx with antibiotics  . GERD (gastroesophageal reflux disease)   . H/O balanitis    recurrent intermittant-- uses diprolene cream prn  . History of colon polyps   . History of kidney stones   . History of prostate cancer urologist-  dr Alinda Money-  currently PSA nondetectable 10/ 2016   s/p  radial prostatectomy w/ nerve sparing 07-17-2007/   stage T1c,  Gleason 3+3=6,  PSA 6.33  . Hyperlipidemia   . Hypertension   . Mild obstructive sleep apnea    uses mouth guard only  . Nocturia   . Osteoarthritis    knees  . Sigmoid diverticulosis   . Synovial hypertrophy of left knee   . Urge urinary incontinence    Past Surgical History:  Procedure Laterality Date  . BACK SURGERY     L4 surgery  . CARDIOVASCULAR STRESS TEST  08-04-2014   normal perfusion nuclear study/  normal LV function and wall motion , ef 54%  . COLONOSCOPY W/ POLYPECTOMY  06-21-2014  . JOINT REPLACEMENT    . KNEE ARTHROSCOPY Bilateral right 07-31-2009//  left   . KNEE ARTHROSCOPY Left 12/08/2014   Procedure: ARTHROSCOPY LEFT KNEE WITH SYNOVECTOMY;  Surgeon: Gaynelle Arabian, MD;  Location: [facility];  Service: Orthopedics;  Laterality: Left;  . KNEE ARTHROSCOPY Left 02/14/2016   Procedure: ARTHROSCOPY KNEE WTH SYNOVECTOMY;  Surgeon: Gaynelle Arabian, MD;  Location:  [facility];  Service: Orthopedics;  Laterality: Left;  . LUMBAR DISC SURGERY  04-13-2002   left  L4 -- L5  . ROBOT ASSISTED LAPAROSCOPIC RADICAL PROSTATECTOMY  07-17-2007  . TONSILLECTOMY  as child  . TOTAL KNEE ARTHROPLASTY Left 03-08-2008  . TOTAL KNEE ARTHROPLASTY Right 04/02/2016   Procedure: RIGHT TOTAL KNEE ARTHROPLASTY;  Surgeon: Gaynelle Arabian, MD;  Location: [facility];  Service: Orthopedics;  Laterality: Right;    reports that he has never smoked. He has never used smokeless tobacco. He reports current alcohol use. He reports that he does not use drugs. family history includes Aneurysm in his father; Heart attack in an other family member; Sudden death in his mother. No Known Allergies   Review of Systems  Constitutional: Negative for chills and fever.  Gastrointestinal: Negative for abdominal pain, nausea and vomiting.       Objective:   Physical Exam Vitals reviewed.  Constitutional:      Appearance: Normal appearance.  Cardiovascular:     Rate and Rhythm: Normal rate and regular rhythm.  Genitourinary:    Comments: He has right inguinal bulge which is exacerbated with cough.  He does have also left inguinal bulge which is less prominent. Neurological:     Mental Status: He is alert.        Assessment:  Probable right inguinal hernia which is mildly symptomatic at this time.  He may have small left inguinal hernia as well    Plan:     -Set up referral to general surgery to further assess -reviewed signs and symptoms of strangulation to watch out for.  [name] MD  Primary Care at [facility]

## 2019-12-25 NOTE — Patient Instructions (Signed)

## 2019-12-29 ENCOUNTER — Encounter: Payer: Self-pay | Admitting: Family Medicine

## 2019-12-31 ENCOUNTER — Encounter: Payer: Self-pay | Admitting: Physical Medicine and Rehabilitation

## 2020-01-07 ENCOUNTER — Encounter: Payer: Self-pay | Admitting: Physical Medicine and Rehabilitation

## 2020-01-07 ENCOUNTER — Ambulatory Visit (INDEPENDENT_AMBULATORY_CARE_PROVIDER_SITE_OTHER): Payer: Medicare Other | Admitting: Physical Medicine and Rehabilitation

## 2020-01-07 ENCOUNTER — Other Ambulatory Visit: Payer: Self-pay

## 2020-01-07 ENCOUNTER — Ambulatory Visit: Payer: Self-pay

## 2020-01-07 VITALS — BP 140/81 | HR 57 | Ht 71.0 in | Wt 185.0 lb

## 2020-01-07 DIAGNOSIS — M47816 Spondylosis without myelopathy or radiculopathy, lumbar region: Secondary | ICD-10-CM

## 2020-01-07 MED ORDER — METHYLPREDNISOLONE ACETATE 80 MG/ML IJ SUSP: 40.0000 mg | Freq: Once | INTRAMUSCULAR | Status: DC

## 2020-01-07 NOTE — Progress Notes (Signed)
  Numeric Pain Rating Scale and Functional Assessment Average Pain (5)   In the last MONTH (on 0-10 scale) has pain interfered with the following?  1. General activity like being  able to carry out your everyday physical activities such as walking, climbing stairs, carrying groceries, or moving a chair?  Rating(5)   +Driver, -BT, -Dye Allergies.  

## 2020-01-08 ENCOUNTER — Encounter: Payer: Self-pay | Admitting: Physical Medicine and Rehabilitation

## 2020-01-08 NOTE — Procedures (Signed)
Lumbar Diagnostic Facet Joint Nerve Block with Fluoroscopic Guidance   Patient: Micheal [name]      Date of Birth: March 01, 1946 MRN: [id number] PCP: [name], MD      Visit Date: 01/07/2020   Universal Protocol:    Date/Time: 04/23/216:01 AM  Consent Given By: the patient  Position: PRONE  Additional Comments: Vital signs were monitored before and after the procedure. Patient was prepped and draped in the usual sterile fashion. The correct patient, procedure, and site was verified.   Injection Procedure Details:  Procedure Site One Meds Administered:  Meds ordered this encounter  Medications  . methylPREDNISolone acetate (DEPO-MEDROL) injection 40 mg     Laterality: Bilateral  Location/Site:  L4-L5  Needle size: 22 ga.  Needle type:spinal  Needle Placement: Oblique pedical  Findings:   -Comments: There was excellent flow of contrast along the articular pillars without intravascular flow.  Procedure Details: The fluoroscope beam is vertically oriented in AP and then obliqued 15 to 20 degrees to the ipsilateral side of the desired nerve to achieve the "Scotty dog" appearance.  The skin over the target area of the junction of the superior articulating process and the transverse process (sacral ala if blocking the L5 dorsal rami) was locally anesthetized with a 1 ml volume of 1% Lidocaine without Epinephrine.  The spinal needle was inserted and advanced in a trajectory view down to the target.   After contact with periosteum and negative aspirate for blood and CSF, correct placement without intravascular or epidural spread was confirmed by injecting 0.5 ml. of Isovue-250.  A spot radiograph was obtained of this image.    Next, a 0.5 ml. volume of the injectate described above was injected. The needle was then redirected to the other facet joint nerves mentioned above if needed.  Prior to the procedure, the patient was given a Pain Diary which was completed for  baseline measurements.  After the procedure, the patient rated their pain every 30 minutes and will continue rating at this frequency for a total of 5 hours.  The patient has been asked to complete the Diary and return to [place] by mail, fax or hand delivered as soon as possible.   Additional Comments:  The patient tolerated the procedure well Dressing: 2 x 2 sterile gauze and Band-Aid    Post-procedure details: Patient was observed during the procedure. Post-procedure instructions were reviewed.  Patient left the clinic in stable condition.

## 2020-01-08 NOTE — Progress Notes (Signed)
Micheal [name] - 74 y.o. male MRN [id number]  Date of birth: 1945-11-23  Office Visit Note: Visit Date: 01/07/2020 PCP: [name], MD Referred by: [name], MD  Subjective: Chief Complaint  Patient presents with  . Lower Back - Pain   HPI:  Micheal [name] is a 74 y.o. male who comes in today For quick reevaluation management of mainly axial low back pain right more than left.  His history is well-documented in her chart.  We initially saw him for some thoracic type pain and then ultimately ended up having some left radicular type pain in the thigh which she has had good relief with L3 transforaminal injection.  Today he is having right more than left axial pain worse with going from sit to stand and with standing.  He also has some pain with walking.  No real claudication symptoms no focal weakness no red flag complaints.  He has had physical therapy he has had exercise at home he has had time.  He has imaging showing facet arthropathy particular at the L4-5 region.  We will complete diagnostic and hopefully therapeutic medial branch blocks at L4-5.  Would complete this in a double block paradigm with pain diary and look at radiofrequency ablation as a more definitive treatment as part of a comprehensive program.  ROS Otherwise per HPI.  Assessment & Plan: Visit Diagnoses:  1. Spondylosis without myelopathy or radiculopathy, lumbar region     Plan: No additional findings.   Meds & Orders:  Meds ordered this encounter  Medications  . methylPREDNISolone acetate (DEPO-MEDROL) injection 40 mg    Orders Placed This Encounter  Procedures  . Facet Injection  . XR C-ARM NO REPORT    Follow-up: No follow-ups on file.   Procedures: No procedures performed  Lumbar Diagnostic Facet Joint Nerve Block with Fluoroscopic Guidance   Patient: Micheal [name]      Date of Birth: 02/27/1946 MRN: [id number] PCP: [name], MD      Visit Date: 01/07/2020     Universal Protocol:    Date/Time: 04/23/216:01 AM  Consent Given By: the patient  Position: PRONE  Additional Comments: Vital signs were monitored before and after the procedure. Patient was prepped and draped in the usual sterile fashion. The correct patient, procedure, and site was verified.   Injection Procedure Details:  Procedure Site One Meds Administered:  Meds ordered this encounter  Medications  . methylPREDNISolone acetate (DEPO-MEDROL) injection 40 mg     Laterality: Bilateral  Location/Site:  L4-L5  Needle size: 22 ga.  Needle type:spinal  Needle Placement: Oblique pedical  Findings:   -Comments: There was excellent flow of contrast along the articular pillars without intravascular flow.  Procedure Details: The fluoroscope beam is vertically oriented in AP and then obliqued 15 to 20 degrees to the ipsilateral side of the desired nerve to achieve the "Scotty dog" appearance.  The skin over the target area of the junction of the superior articulating process and the transverse process (sacral ala if blocking the L5 dorsal rami) was locally anesthetized with a 1 ml volume of 1% Lidocaine without Epinephrine.  The spinal needle was inserted and advanced in a trajectory view down to the target.   After contact with periosteum and negative aspirate for blood and CSF, correct placement without intravascular or epidural spread was confirmed by injecting 0.5 ml. of Isovue-250.  A spot radiograph was obtained of this image.    Next, a 0.5 ml.  volume of the injectate described above was injected. The needle was then redirected to the other facet joint nerves mentioned above if needed.  Prior to the procedure, the patient was given a Pain Diary which was completed for baseline measurements.  After the procedure, the patient rated their pain every 30 minutes and will continue rating at this frequency for a total of 5 hours.  The patient has been asked to complete the Diary  and return to [place] by mail, fax or hand delivered as soon as possible.   Additional Comments:  The patient tolerated the procedure well Dressing: 2 x 2 sterile gauze and Band-Aid    Post-procedure details: Patient was observed during the procedure. Post-procedure instructions were reviewed.  Patient left the clinic in stable condition.    Clinical History: NCV & EMG Findings: 05/01/2018 Extensive electrodiagnostic testing of the right lower extremity and additional studies of the left shows:  1. Bilateral sural and superficial peroneal sensory responses are within normal limits. 2. Left peroneal motor response shows reduced amplitude at the extensor digitorum brevis, and is asymmetrically reduced at the tibialis anterior when compared to the right. Bilateral tibial and right peroneal motor responses are within normal limits. 3. Bilateral tibial H reflex studies are within normal limits. 4. Chronic motor axon loss changes are seen affecting the L5 myotome on the left, without accompanied active denervation.  Impression: 1. 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 2. There is no evidence of a large fiber sensorimotor polyneuropathy affecting the lower extremities.    ___________________________ Narda Amber, DO  MRI LUMBAR SPINE WITHOUT AND WITH CONTRAST   L2-L3: Disc desiccation and small bulge with severe bilateral facet hypertrophy resulting in moderate narrowing of the central spinal canal that has progressed slightly from the prior study. No neural foraminal stenosis.  L3-L4: Disc space narrowing and desiccation with prior laminotomy on the left. No spinal canal stenosis. Moderate right and severe left neural foraminal stenosis, unchanged.  L4-L5: Postsurgical changes. Severe bilateral facet hypertrophy and small disc bulge. No central spinal canal stenosis. Bilateral lateral recess narrowing, unchanged. Left  foraminal disc protrusion it is decreased in size. Mild left foraminal narrowing with small amount of granulation tissue.  L5-S1: Disc space narrowing and endplate spurring with small central disc osteophyte complex. No spinal canal stenosis. No neural foraminal stenosis.  Visualized sacrum: Normal.  IMPRESSION: 1. Status post partial left facetectomy at L4-L5 with improved patency of the neural foramen. Mild persistent foraminal narrowing secondary to small amount of granulation tissue. 2. Mild progression of disc and facet degeneration at L2-L3 with resulting moderate spinal canal stenosis. 3. Unchanged postsurgical appearance at L3-L4 with moderate right and severe left foraminal stenosis. 4. Multilevel severe facet arthrosis.   Electronically Signed By: Ulyses Jarred M.D. On: 11/18/2016 23:08 MRI THORACIC SPINE WITHOUT CONTRAST  TECHNIQUE: Multiplanar, multisequence MR imaging of the thoracic spine was performed. No intravenous contrast was administered.  COMPARISON: Chest radiography 09/11/2017.  FINDINGS: Alignment: Normal  Vertebrae: No fracture or primary bone lesion.  Cord: No cord compression or primary cord lesion.  Paraspinal and other soft tissues: Normal  Disc levels:  Cervical scout study shows degenerative spondylosis at C4-5, C5-6 and C6-7.  T1-2: Normal.  T2-3: Normal.  T3-4: Shallow left-sided disc bulge. No stenosis or neural compression.  T4-5: Shallow left-sided disc bulge. No stenosis or neural compression.  T5-6: Endplate osteophytes and bulging of the disc. Narrowing of the ventral subarachnoid space but no compressive  effect upon the canal or foramina.  T6-7: Left-sided disc bulge. No stenosis or neural compression.  T7-8: Mild bulging of the disc. No stenosis or neural compression.  T8-9: Mild bulging of the disc. No stenosis or neural compression.  T9-10: Normal interspace.  T10-11: Left paracentral  disc bulge. No stenosis or neural compression.  T11-12: Normal interspace.  T12-L1: Normal interspace.  No significant facet arthropathy.  IMPRESSION: No cause of the presenting symptoms is identified. Mild degenerative changes of the spine for a person of this age. No advanced disease, neural compressive findings or active bone or joint pathology seen.   Electronically Signed By: Nelson Chimes M.D. On: 09/25/2017 08:40     Objective:  VS:  HT:5\' 11"  (180.3 cm)   WT:185 lb (83.9 kg)  BMI:25.81    BP:140/81  HR:(!) 57bpm  TEMP: ( )  RESP:  Physical Exam Constitutional:      General: He is not in acute distress.    Appearance: Normal appearance. He is not ill-appearing.  HENT:     Head: Normocephalic and atraumatic.     Right Ear: External ear normal.     Left Ear: External ear normal.  Eyes:     Extraocular Movements: Extraocular movements intact.  Cardiovascular:     Rate and Rhythm: Normal rate.     Pulses: Normal pulses.  Abdominal:     General: There is no distension.     Palpations: Abdomen is soft.  Musculoskeletal:        General: No tenderness or signs of injury.     Right lower leg: No edema.     Left lower leg: No edema.     Comments: Patient has good distal strength without clonus.  He does have concordant back pain with facet loading and extension  Skin:    Findings: No erythema or rash.  Neurological:     General: No focal deficit present.     Mental Status: He is alert and oriented to person, place, and time.     Sensory: No sensory deficit.     Motor: No weakness or abnormal muscle tone.     Coordination: Coordination normal.  Psychiatric:        Mood and Affect: Mood normal.        Behavior: Behavior normal.     Ortho Exam Imaging: XR C-ARM NO REPORT  Result Date: 01/07/2020 Please see Notes tab for imaging impression.

## 2020-01-13 ENCOUNTER — Encounter: Payer: Self-pay | Admitting: Physical Medicine and Rehabilitation

## 2020-01-18 ENCOUNTER — Encounter: Payer: Self-pay | Admitting: Physical Medicine and Rehabilitation

## 2020-01-18 DIAGNOSIS — M4802 Spinal stenosis, cervical region: Secondary | ICD-10-CM

## 2020-01-26 DIAGNOSIS — K4020 Bilateral inguinal hernia, without obstruction or gangrene, not specified as recurrent: Secondary | ICD-10-CM | POA: Diagnosis not present

## 2020-01-31 ENCOUNTER — Other Ambulatory Visit: Payer: Self-pay

## 2020-01-31 ENCOUNTER — Ambulatory Visit
Admission: RE | Admit: 2020-01-31 | Discharge: 2020-01-31 | Disposition: A | Discharge status: Home / Self Care | Payer: Medicare Other | Source: Ambulatory Visit | Attending: Physical Medicine and Rehabilitation | Admitting: Physical Medicine and Rehabilitation

## 2020-01-31 DIAGNOSIS — M4802 Spinal stenosis, cervical region: Secondary | ICD-10-CM

## 2020-02-02 ENCOUNTER — Encounter: Payer: Self-pay | Admitting: Physical Medicine and Rehabilitation

## 2020-02-09 ENCOUNTER — Ambulatory Visit (INDEPENDENT_AMBULATORY_CARE_PROVIDER_SITE_OTHER): Payer: Medicare Other | Admitting: Physical Medicine and Rehabilitation

## 2020-02-09 ENCOUNTER — Other Ambulatory Visit: Payer: Self-pay

## 2020-02-09 ENCOUNTER — Encounter: Payer: Self-pay | Admitting: Physical Medicine and Rehabilitation

## 2020-02-09 VITALS — BP 110/75 | HR 68

## 2020-02-09 DIAGNOSIS — M5412 Radiculopathy, cervical region: Secondary | ICD-10-CM | POA: Diagnosis not present

## 2020-02-09 DIAGNOSIS — M961 Postlaminectomy syndrome, not elsewhere classified: Secondary | ICD-10-CM

## 2020-02-09 DIAGNOSIS — M4802 Spinal stenosis, cervical region: Secondary | ICD-10-CM

## 2020-02-09 DIAGNOSIS — M48062 Spinal stenosis, lumbar region with neurogenic claudication: Secondary | ICD-10-CM

## 2020-02-09 DIAGNOSIS — M47816 Spondylosis without myelopathy or radiculopathy, lumbar region: Secondary | ICD-10-CM

## 2020-02-09 NOTE — Progress Notes (Signed)
Numeric Pain Rating Scale and Functional Assessment Average Pain 7   In the last MONTH (on 0-10 scale) has pain interfered with the following?  1. General activity like being  able to carry out your everyday physical activities such as walking, climbing stairs, carrying groceries, or moving a chair?  Rating(7)   +Driver, -BT, -Dye Allergies. Pain in left shoulder and weakness in left arm. Pain in low back on the right side radiating to the right leg

## 2020-02-10 DIAGNOSIS — M4712 Other spondylosis with myelopathy, cervical region: Secondary | ICD-10-CM | POA: Diagnosis not present

## 2020-02-10 DIAGNOSIS — M5441 Lumbago with sciatica, right side: Secondary | ICD-10-CM | POA: Diagnosis not present

## 2020-02-10 DIAGNOSIS — G8929 Other chronic pain: Secondary | ICD-10-CM | POA: Diagnosis not present

## 2020-02-10 DIAGNOSIS — M4722 Other spondylosis with radiculopathy, cervical region: Secondary | ICD-10-CM | POA: Diagnosis not present

## 2020-02-16 ENCOUNTER — Encounter: Payer: Self-pay | Admitting: Physical Medicine and Rehabilitation

## 2020-02-23 DIAGNOSIS — M5441 Lumbago with sciatica, right side: Secondary | ICD-10-CM | POA: Diagnosis not present

## 2020-02-26 DIAGNOSIS — M4316 Spondylolisthesis, lumbar region: Secondary | ICD-10-CM | POA: Diagnosis not present

## 2020-02-26 DIAGNOSIS — M48062 Spinal stenosis, lumbar region with neurogenic claudication: Secondary | ICD-10-CM | POA: Diagnosis not present

## 2020-02-26 DIAGNOSIS — M5126 Other intervertebral disc displacement, lumbar region: Secondary | ICD-10-CM | POA: Diagnosis not present

## 2020-02-29 ENCOUNTER — Other Ambulatory Visit: Payer: Self-pay | Admitting: Neurosurgery

## 2020-03-07 ENCOUNTER — Encounter: Payer: Self-pay | Admitting: Physical Medicine and Rehabilitation

## 2020-03-09 ENCOUNTER — Encounter: Payer: Self-pay | Admitting: Physical Medicine and Rehabilitation

## 2020-03-09 NOTE — Progress Notes (Signed)
Micheal [name] - 74 y.o. male MRN [id number]  Date of birth: 10-12-1945  Office Visit Note: Visit Date: 02/09/2020 PCP: [name], MD Referred by: [name], MD  Subjective: Chief Complaint  Patient presents with  . Neck - Pain, Follow-up   HPI: Micheal [name] is a 74 y.o. male who comes in today For evaluation and management of continued chronic worsening severe neck pain with radicular arm pain and weakness as well as low back pain with referral in the right hip and leg. These are both chronic conditions with exacerbation and worsening. Rates his pain as a 7 out of 10 pretty severe worse in the cervical spine and the lumbar. His brief history of his lumbar spine as he has had prior facet joint cyst and facetectomy as well as laminectomy decompression at two levels. No fusion. He has had a history of back pain with hip and leg pain. His notes from me are documented and those can be fully reviewed for his history. Last injection performed was a facet joint block and he did get some relief with that and his low back pain is actually doing better but still problematic. It is mainly with working and walking and exercising. He did have an MRI in 2018 is also had electrodiagnostic studies by Dr. Posey Pronto this did show a chronic L5 radiculopathy. This radiculopathy was on the right. He has had no new symptoms or focal weakness from this issue.  Newer complaints have been cervical spine pain which has been intermittent along with history of thoracic pain. Is been evaluated by Dr. Newman Pies at [facility]. We actually completed cervical injection several years ago when he first came to [place] and he was having this vague thoracic type pain. More lately it has declared itself into the left arm with more radicular pain and weakness. No progressive weakness no associated headaches no fevers chills or weight loss or any issues with bowel or bladder changes. We  did obtain MRI of the cervical spine and this is reviewed with the patient and the report was reviewed today. He does have pretty significant central stenosis.   Review of Systems  Musculoskeletal: Positive for back pain, joint pain and neck pain.  Neurological: Positive for tingling, tremors and focal weakness.  All other systems reviewed and are negative.  Otherwise per HPI.  Assessment & Plan: Visit Diagnoses:  1. Spinal stenosis of cervical region   2. Cervical radiculopathy   3. Spinal stenosis of lumbar region with neurogenic claudication   4. Spondylosis without myelopathy or radiculopathy, lumbar region   5. Post laminectomy syndrome     Plan: Findings:  1. Chronic cervicalgia now with radicular complaints with a history of cervical stenosis now with new MRI findings of pretty significant severe multifactorial stenosis. We talked at length today about cervical stenosis in the problems with that. We talked about minor injuries can result into paralysis because of the tightness around the cord. We talked about risk and benefit of epidural injection. At this point we has a team decided to have him see Dr. Newman Pies for evaluation to get his viewpoint on the cervical spine at this point. Continue with current medications and home exercises.  2. Chronic low back pain with right radicular leg pain with electrodiagnostic studies confirming mild radiculopathy and history of prior lumbar laminectomy decompression and facet ectomy. Intermittent epidural injection has helped to some degree as did facet joint block. Patient might  do well with spinal cord stimulator trial. This was not really discussed today but mentioned. I think at this point the biggest concern is cervical spine above and he can talk to Dr. [name] about his lower back as well.    Meds & Orders: No orders of the defined types were placed in this encounter.  No orders of the defined types were placed in this encounter.     Follow-up: Return for See Dr. Newman Pies for consultation.   Procedures: No procedures performed  No notes on file   Clinical History: CLINICAL DATA:  Cervical spine stenosis. Left arm and shoulder pain and weakness  EXAM: MRI CERVICAL SPINE WITHOUT CONTRAST  TECHNIQUE: Multiplanar, multisequence MR imaging of the cervical spine was performed. No intravenous contrast was administered.  COMPARISON:  08/28/2017  FINDINGS: Alignment: Mild retrolisthesis at C5-6.Mild anterolisthesis at C7-T1.  Vertebrae: No fracture, evidence of discitis, or bone lesion.  Cord: Degenerative cord impingement noted below. No cord signal abnormality.  Posterior Fossa, vertebral arteries, paraspinal tissues: Negative  Disc levels:  C2-3: Shallow central disc protrusion and minor facet spurring. No impingement  C3-4: Disc narrowing with endplate ridging and bulging. Mild facet spurring. Spinal stenosis with effacement of ventral and dorsal CSF, progressed. Uncovertebral ridging with moderate foraminal narrowing on both sides.  C4-5: Disc narrowing and bulging with uncovertebral ridging. Mild spinal and left foraminal stenosis.  C5-6: Disc narrowing with endplate and uncovertebral ridging. There is spinal stenosis with cord flattening that is progressed. Foraminal impingement that is moderate to advanced on the right and mild to moderate on the left  C6-7: Disc narrowing with endplate and uncovertebral ridging. Advanced left and mild right foraminal stenosis. Mild spinal stenosis.  C7-T1:Facet spurring and mild anterolisthesis. No herniation or impingement  IMPRESSION: 1. Multilevel degenerative disease which has progressed from 2018. 2. Notable spinal stenosis at C5-6 with cord flattening. 3. On the symptomatic left side there is high-grade foraminal impingement at C6-7 and moderate foraminal narrowing at C3-4. 4. Right-sided foraminal narrowings most notable  at C3-4 and C6-7.   Electronically Signed   By: Monte Fantasia M.D.   On: 02/01/2020 08:46 ----- NCV & EMG Findings: 05/01/2018    Impression:  1. 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  2. There is no evidence of a large fiber sensorimotor polyneuropathy affecting the lower extremities.        ___________________________  Narda Amber, DO   MRI LUMBAR SPINE WITHOUT AND WITH CONTRAST      L2-L3: Disc desiccation and small bulge with severe bilateral facet  hypertrophy resulting in moderate narrowing of the central spinal  canal that has progressed slightly from the prior study. No neural  foraminal stenosis.    L3-L4: Disc space narrowing and desiccation with prior laminotomy on  the left. No spinal canal stenosis. Moderate right and severe left  neural foraminal stenosis, unchanged.    L4-L5: Postsurgical changes. Severe bilateral facet hypertrophy and  small disc bulge. No central spinal canal stenosis. Bilateral  lateral recess narrowing, unchanged. Left foraminal disc protrusion  it is decreased in size. Mild left foraminal narrowing with small  amount of granulation tissue.    L5-S1: Disc space narrowing and endplate spurring with small central  disc osteophyte complex. No spinal canal stenosis. No neural  foraminal stenosis.    Visualized sacrum: Normal.    IMPRESSION:  1. Status post partial left facetectomy at L4-L5 with improved  patency of the neural foramen.  Mild persistent foraminal narrowing  secondary to small amount of granulation tissue.  2. Mild progression of disc and facet degeneration at L2-L3 with  resulting moderate spinal canal stenosis.  3. Unchanged postsurgical appearance at L3-L4 with moderate right  and severe left foraminal stenosis.  4. Multilevel severe facet arthrosis.      Electronically Signed  By: Ulyses Jarred M.D.  On: 11/18/2016 23:08   He reports that he  has never smoked. He has never used smokeless tobacco.  Recent Labs    11/17/19 0750  HGBA1C 6.0    Objective:  VS:  HT:    WT:   BMI:     BP:110/75  HR:68bpm  TEMP: ( )  RESP:  Physical Exam Vitals and nursing note reviewed.  Constitutional:      General: He is not in acute distress.    Appearance: Normal appearance. He is well-developed.  HENT:     Head: Normocephalic and atraumatic.  Eyes:     Conjunctiva/sclera: Conjunctivae normal.     Pupils: Pupils are equal, round, and reactive to light.  Cardiovascular:     Rate and Rhythm: Normal rate.     Pulses: Normal pulses.     Heart sounds: Normal heart sounds.  Pulmonary:     Effort: Pulmonary effort is normal. No respiratory distress.  Musculoskeletal:     Cervical back: Neck supple. Tenderness present.     Right lower leg: No edema.     Left lower leg: No edema.     Comments: Examination of cervical spine shows increased forward flexion he does have pain with extension but negative Spurling's test. He does have some trigger points in the levator scapula bilaterally. Mild shoulder impingement right more than left. He has good strength in the hands without atrophy and good strength particularly with wrist extension long finger flexion and abduction. He has negative Hoffmann's test bilaterally. He has good distal strength. He has no clonus.  Lymphadenopathy:     Cervical: No cervical adenopathy.  Skin:    General: Skin is warm and dry.     Findings: No erythema or rash.  Neurological:     General: No focal deficit present.     Mental Status: He is alert and oriented to person, place, and time.     Sensory: No sensory deficit.     Coordination: Coordination normal.     Gait: Gait normal.  Psychiatric:        Mood and Affect: Mood normal.        Behavior: Behavior normal.     Ortho Exam  Imaging: No results found.  Past Medical/Family/Surgical/Social History: Medications & Allergies reviewed per EMR, new  medications updated. Patient Active Problem List   Diagnosis Date Noted  . Right-sided chest wall pain 03/27/2018  . Pain of right scapula 03/27/2018  . Spinal stenosis of cervical region 03/27/2018  . Knee pain, bilateral 12/18/2016  . Lumbar degenerative disc disease 12/18/2016  . OA (osteoarthritis) of knee 04/02/2016  . Synovitis of knee 12/08/2014  . Peripheral neuropathy 11/11/2013  . Chronic anxiety 10/26/2013  . Plantar fasciitis of left foot 01/01/2013  . Osteoarthritis of right knee 01/01/2013  . Tear, knee, lateral collateral ligament 01/01/2013  . INSOMNIA, CHRONIC 10/17/2010  . FATIGUE 10/17/2010  . Tremor, essential 07/20/2010  . Hyperlipidemia 03/03/2010  . Essential hypertension 03/03/2010  . ADENOCARCINOMA, PROSTATE, HX OF 03/03/2010  . NEPHROLITHIASIS, HX OF 03/03/2010   Past Medical History:  Diagnosis Date  . Anxiety   .  Benign essential tremor    hands  . Complication of anesthesia 1999   spinal with last knee replacement  . Diverticulitis    last Saturday 03/24/16 -tx with antibiotics  . GERD (gastroesophageal reflux disease)   . H/O balanitis    recurrent intermittant-- uses diprolene cream prn  . History of colon polyps   . History of kidney stones   . History of prostate cancer urologist-  dr Alinda Money-  currently PSA nondetectable 10/ 2016   s/p  radial prostatectomy w/ nerve sparing 07-17-2007/   stage T1c,  Gleason 3+3=6,  PSA 6.33  . Hyperlipidemia   . Hypertension   . Mild obstructive sleep apnea    uses mouth guard only  . Nocturia   . Osteoarthritis    knees  . Sigmoid diverticulosis   . Synovial hypertrophy of left knee   . Urge urinary incontinence    Family History  Problem Relation Age of Onset  . Sudden death Mother   . Aneurysm Father   . Heart attack Other        grandfather   Past Surgical History:  Procedure Laterality Date  . BACK SURGERY     L4 surgery  . CARDIOVASCULAR STRESS TEST  08-04-2014   normal perfusion  nuclear study/  normal LV function and wall motion , ef 54%  . COLONOSCOPY W/ POLYPECTOMY  06-21-2014  . JOINT REPLACEMENT    . KNEE ARTHROSCOPY Bilateral right 07-31-2009//  left   . KNEE ARTHROSCOPY Left 12/08/2014   Procedure: ARTHROSCOPY LEFT KNEE WITH SYNOVECTOMY;  Surgeon: Gaynelle Arabian, MD;  Location: [facility];  Service: Orthopedics;  Laterality: Left;  . KNEE ARTHROSCOPY Left 02/14/2016   Procedure: ARTHROSCOPY KNEE WTH SYNOVECTOMY;  Surgeon: Gaynelle Arabian, MD;  Location: [facility];  Service: Orthopedics;  Laterality: Left;  . LUMBAR DISC SURGERY  04-13-2002   left  L4 -- L5  . ROBOT ASSISTED LAPAROSCOPIC RADICAL PROSTATECTOMY  07-17-2007  . TONSILLECTOMY  as child  . TOTAL KNEE ARTHROPLASTY Left 03-08-2008  . TOTAL KNEE ARTHROPLASTY Right 04/02/2016   Procedure: RIGHT TOTAL KNEE ARTHROPLASTY;  Surgeon: Gaynelle Arabian, MD;  Location: [facility];  Service: Orthopedics;  Laterality: Right;   Social History   Occupational History  . 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 Smoking status: Never Smoker  . Smokeless tobacco: Never Used  Vaping Use  . Vaping Use: Never used  Substance and Sexual Activity  . Alcohol use: Yes    Comment: social 1-2 weekly  . Drug use: No  . Sexual activity: Not on file

## 2020-03-15 ENCOUNTER — Other Ambulatory Visit: Payer: Self-pay | Admitting: Neurosurgery

## 2020-03-15 NOTE — Progress Notes (Signed)
[facility] [place], [place] AT [address] [place] [address] Phone: [phone] Fax: [phone]      Your procedure is scheduled on July 7  Report to [facility] Main Entrance "A" at 1050 A.M., and check in at the Admitting office.  Call this number if you have problems the morning of surgery:  [phone]  Call [phone] if you have any questions prior to your surgery date Monday-Friday 8am-4pm    Remember:  Do not eat or drink after midnight the night before your surgery     Take these medicines the morning of surgery with A SIP OF WATER  acetaminophen (TYLENOL)  If needed amLODipine (NORVASC) gabapentin (NEURONTIN) sertraline (ZOLOFT)   As of today, STOP taking any Aspirin (unless otherwise instructed by your surgeon) and Aspirin containing products, Aleve, Naproxen, Ibuprofen, Motrin, Advil, Goody's, BC's, all herbal medications, fish oil, and all vitamins.                      Do not wear jewelry            Do not wear lotions, powders, colognes, or deodorant.            Men may shave face and neck.            Do not bring valuables to the hospital.            [facility] is not responsible for any belongings or valuables.  Do NOT Smoke (Tobacco/Vapping) or drink Alcohol 24 hours prior to your procedure If you use a CPAP at night, you may bring all equipment for your overnight stay.   Contacts, glasses, dentures or bridgework may not be worn into surgery.      For patients admitted to the hospital, discharge time will be determined by your treatment team.   Patients discharged the day of surgery will not be allowed to drive home, and someone needs to stay with them for 24 hours.    Special instructions:   [place]- Preparing For Surgery  Before surgery, you can play an important role. Because skin is not sterile, your skin needs to be as free of germs as possible. You can  reduce the number of germs on your skin by washing with CHG (chlorahexidine gluconate) Soap before surgery.  CHG is an antiseptic cleaner which kills germs and bonds with the skin to continue killing germs even after washing.    Oral Hygiene is also important to reduce your risk of infection.  Remember - BRUSH YOUR TEETH THE MORNING OF SURGERY WITH YOUR REGULAR TOOTHPASTE  Please do not use if you have an allergy to CHG or antibacterial soaps. If your skin becomes reddened/irritated stop using the CHG.  Do not shave (including legs and underarms) for at least 48 hours prior to first CHG shower. It is OK to shave your face.  Please follow these instructions carefully.   1. Shower the NIGHT BEFORE SURGERY and the MORNING OF SURGERY with CHG Soap.   2. If you chose to wash your hair, wash your hair first as usual with your normal shampoo.  3. After you shampoo, rinse your hair and body thoroughly to remove the shampoo.  4. Use CHG as you would any other liquid soap. You can apply CHG directly to the skin and wash gently with a scrungie or a clean washcloth.   5. Apply  the CHG Soap to your body ONLY FROM THE NECK DOWN.  Do not use on open wounds or open sores. Avoid contact with your eyes, ears, mouth and genitals (private parts). Wash Face and genitals (private parts)  with your normal soap.   6. Wash thoroughly, paying special attention to the area where your surgery will be performed.  7. Thoroughly rinse your body with warm water from the neck down.  8. DO NOT shower/wash with your normal soap after using and rinsing off the CHG Soap.  9. Pat yourself dry with a CLEAN TOWEL.  10. Wear CLEAN PAJAMAS to bed the night before surgery, wear comfortable clothes the morning of surgery  11. Place CLEAN SHEETS on your bed the night of your first shower and DO NOT SLEEP WITH PETS.   Day of Surgery:   Do not apply any deodorants/lotions.  Please wear clean clothes to the hospital/surgery  center.   Remember to brush your teeth WITH YOUR REGULAR TOOTHPASTE.   Please read over the following fact sheets that you were given.

## 2020-03-16 ENCOUNTER — Other Ambulatory Visit: Payer: Self-pay

## 2020-03-16 ENCOUNTER — Encounter (HOSPITAL_COMMUNITY): Payer: Self-pay

## 2020-03-16 ENCOUNTER — Encounter (HOSPITAL_COMMUNITY)
Admission: RE | Admit: 2020-03-16 | Discharge: 2020-03-16 | Disposition: A | Discharge status: Home / Self Care | Payer: Medicare Other | Source: Ambulatory Visit | Attending: Neurosurgery | Admitting: Neurosurgery

## 2020-03-16 DIAGNOSIS — Z01818 Encounter for other preprocedural examination: Secondary | ICD-10-CM | POA: Diagnosis not present

## 2020-03-16 LAB — BASIC METABOLIC PANEL
Anion gap: 6 (ref 5–15)
BUN: 13 mg/dL (ref 8–23)
CO2: 26 mmol/L (ref 22–32)
Calcium: 9.1 mg/dL (ref 8.9–10.3)
Chloride: 106 mmol/L (ref 98–111)
Creatinine, Ser: 0.83 mg/dL (ref 0.61–1.24)
GFR calc Af Amer: >60 mL/min (ref >60)
GFR calc non Af Amer: >60 mL/min (ref >60)
Glucose, Bld: 106 mg/dL — ABNORMAL HIGH (ref 70–99)
Potassium: 4.2 mmol/L (ref 3.5–5.1)
Sodium: 138 mmol/L (ref 135–145)

## 2020-03-16 LAB — TYPE AND SCREEN
ABO/RH(D): O POS
Antibody Screen: NEGATIVE

## 2020-03-16 LAB — CBC
HCT: 43.7 % (ref 39.0–52.0)
Hemoglobin: 14.9 g/dL (ref 13.0–17.0)
MCH: 32.7 pg (ref 26.0–34.0)
MCHC: 34.1 g/dL (ref 30.0–36.0)
MCV: 95.8 fL (ref 80.0–100.0)
Platelets: 245 10*3/uL (ref 150–400)
RBC: 4.56 MIL/uL (ref 4.22–5.81)
RDW: 13.4 % (ref 11.5–15.5)
WBC: 7.4 10*3/uL (ref 4.0–10.5)
nRBC: 0 % (ref 0.0–0.2)

## 2020-03-16 LAB — SURGICAL PCR SCREEN
MRSA, PCR: NEGATIVE
Staphylococcus aureus: NEGATIVE

## 2020-03-16 NOTE — Progress Notes (Signed)
PCP - [name], MD Cardiologist - Denies  PPM/ICD - Denies  Chest x-ray - N/A EKG - 03/16/20 Stress Test - 08/04/14 ECHO - Denies Cardiac Cath - Denies  Sleep Study - 2013 CPAP - No; Mouthguard  Patient denies being a diabetic.  Blood Thinner Instructions: N/A Aspirin Instructions: N/A  ERAS Protcol - N/A PRE-SURGERY Ensure or G2- N/A  COVID TEST- 02/21/20   Anesthesia review: Yes, review stress test.  Patient denies shortness of breath, fever, cough and chest pain at PAT appointment   All instructions explained to the patient, with a verbal understanding of the material. Patient agrees to go over the instructions while at home for a better understanding. Patient also instructed to self quarantine after being tested for COVID-19. The opportunity to ask questions was provided.

## 2020-03-17 NOTE — Progress Notes (Signed)
Anesthesia Chart Review:  Case: [id number] Date/Time: 03/23/20 0815   Procedure: POSTERIOR LUMBAR INTERBODY FUSION, POSTERIOR INSTRUMENTATION LUMBAR 2- LUMBAR 3 (N/A ) - 3C   Anesthesia type: General   Pre-op diagnosis: SPINAL STENOSIS, LUMBAR REGION WITH NEUROGENIC CLAUDICATION   Location: [place] OR ROOM 21 / [facility]   Surgeons: Newman Pies, MD      DISCUSSION: Patient is a 74 year old male scheduled for the above procedure.  History includes never smoker, HTN, HLD, GERD, OSA ("mild" per patient, uses mouth guard), diverticulitis (2017), back surgery (left L3-4 extraforaminal diskectomy, left L4-5 foraminotomy 04/13/02), prostate cancer (s/p robotic assisted nerve sparing prostatectomy 07/17/07), TKA (left 03/08/08 attempted spinal but only partially effective, converted to GA; right 04/02/16 with spinal).  Preoperative COVID-19 test is scheduled for 03/22/2020.  Anesthesia team to evaluate on the day of surgery.   VS: BP 130/90   Pulse 63   Temp 36.7 C (Oral)   Resp 17   Ht 5\' 10"  (1.778 m)   Wt 89.1 kg   SpO2 99%   BMI 28.18 kg/m    PROVIDERS: [name], MD his PCP   LABS: Labs reviewed: Acceptable for surgery. (all labs ordered are listed, but only abnormal results are displayed)  Labs Reviewed  BASIC METABOLIC PANEL - Abnormal; Notable for the following components:      Result Value   Glucose, Bld 106 (*)    All other components within normal limits  SURGICAL PCR SCREEN  CBC  TYPE AND SCREEN     IMAGES: MRI L-spine 02/23/20 ([facility], report in Canopy/PACS): IMPRESSION: - Progression of degenerative disc disease at L2-3 where there is severe central canal and right worse than left subarticular recess narrowing. Buckling of nerve roots below the L2-3 level due to stenosis is new since the prior exam. The appearance of the lumbar spine is otherwise unchanged. - Status post left laminotomy L3-4 and L4-5. Narrowing in the right subarticular recess and mild to  moderate bilateral foraminal narrowing at L4-5 are unchanged.  MRI C-spine 01/31/20: IMPRESSION: 1. Multilevel degenerative disease which has progressed from 2018. 2. Notable spinal stenosis at C5-6 with cord flattening. 3. On the symptomatic left side there is high-grade foraminal impingement at C6-7 and moderate foraminal narrowing at C3-4. 4. Right-sided foraminal narrowings most notable at C3-4 and C6-7.   EKG: 03/16/20: Sinus bradycardia at 58 bpm Otherwise normal ECG No significant change since last tracing Confirmed by Dorris Carnes [id number]) on 03/16/2020 6:25:17 PM   CV: Nuclear stress test 08/04/14: Overall Impression:  Normal stress nuclear study. LV Ejection Fraction: 54%.  LV Wall Motion:  NL LV Function; NL Wall Motion    Past Medical History:  Diagnosis Date  . Anxiety   . Benign essential tremor    hands  . Cancer [facility])    prostate cancer; skin cancers  . Complication of anesthesia 1999   spinal with last knee replacement  . Diverticulitis    last Saturday 03/24/16 -tx with antibiotics  . GERD (gastroesophageal reflux disease)   . H/O balanitis    recurrent intermittant-- uses diprolene cream prn  . History of colon polyps   . History of kidney stones   . History of prostate cancer urologist-  dr Alinda Money-  currently PSA nondetectable 10/ 2016   s/p  radial prostatectomy w/ nerve sparing 07-17-2007/   stage T1c,  Gleason 3+3=6,  PSA 6.33  . Hyperlipidemia   . Hypertension   . Mild obstructive sleep apnea    uses  mouth guard only  . Nocturia   . Osteoarthritis    knees  . Sigmoid diverticulosis   . Synovial hypertrophy of left knee   . Urge urinary incontinence     Past Surgical History:  Procedure Laterality Date  . BACK SURGERY     L4 surgery  . CARDIOVASCULAR STRESS TEST  08-04-2014   normal perfusion nuclear study/  normal LV function and wall motion , ef 54%  . COLONOSCOPY W/ POLYPECTOMY  06-21-2014  . DIAGNOSTIC LAPAROSCOPY    . FRACTURE  SURGERY  1975   right thumb  . JOINT REPLACEMENT    . KNEE ARTHROSCOPY Bilateral right 07-31-2009//  left   . KNEE ARTHROSCOPY Left 12/08/2014   Procedure: ARTHROSCOPY LEFT KNEE WITH SYNOVECTOMY;  Surgeon: Gaynelle Arabian, MD;  Location: [facility];  Service: Orthopedics;  Laterality: Left;  . KNEE ARTHROSCOPY Left 02/14/2016   Procedure: ARTHROSCOPY KNEE WTH SYNOVECTOMY;  Surgeon: Gaynelle Arabian, MD;  Location: [facility];  Service: Orthopedics;  Laterality: Left;  . LUMBAR DISC SURGERY  04-13-2002   left  L4 -- L5  . ROBOT ASSISTED LAPAROSCOPIC RADICAL PROSTATECTOMY  07-17-2007  . TONSILLECTOMY  as child  . TOTAL KNEE ARTHROPLASTY Left 03-08-2008  . TOTAL KNEE ARTHROPLASTY Right 04/02/2016   Procedure: RIGHT TOTAL KNEE ARTHROPLASTY;  Surgeon: Gaynelle Arabian, MD;  Location: [facility];  Service: Orthopedics;  Laterality: Right;    MEDICATIONS: . acetaminophen (TYLENOL) 500 MG tablet  . amLODipine (NORVASC) 5 MG tablet  . atorvastatin (LIPITOR) 20 MG tablet  . gabapentin (NEURONTIN) 600 MG tablet  . ibuprofen (ADVIL) 200 MG tablet  . olmesartan (BENICAR) 20 MG tablet  . Probiotic Product (PROBIOTIC PO)  . sertraline (ZOLOFT) 50 MG tablet  . Zoster Vaccine Adjuvanted [facility]) injection   No current facility-administered medications for this encounter.    Myra Gianotti, PA-C Surgical Short Stay/Anesthesiology [facility] Phone [phone] [facility] Phone [phone] 03/17/2020 3:07 PM

## 2020-03-17 NOTE — Anesthesia Preprocedure Evaluation (Addendum)
Anesthesia Evaluation  Patient identified by MRN, date of birth, ID band Patient awake    Reviewed: Allergy & Precautions, NPO status , Patient's Chart, lab work & pertinent test results  Airway Mallampati: I  TM Distance: >3 FB Neck ROM: Full    Dental   Pulmonary sleep apnea ,    Pulmonary exam normal        Cardiovascular hypertension, Pt. on medications Normal cardiovascular exam     Neuro/Psych Anxiety    GI/Hepatic GERD  Medicated and Controlled,  Endo/Other    Renal/GU      Musculoskeletal   Abdominal   Peds  Hematology   Anesthesia Other Findings   Reproductive/Obstetrics                            Anesthesia Physical Anesthesia Plan  ASA: II  Anesthesia Plan: General   Post-op Pain Management:    Induction: Intravenous  PONV Risk Score and Plan: 2  Airway Management Planned: Oral ETT  Additional Equipment:   Intra-op Plan:   Post-operative Plan: Extubation in OR  Informed Consent: I have reviewed the patients History and Physical, chart, labs and discussed the procedure including the risks, benefits and alternatives for the proposed anesthesia with the patient or authorized representative who has indicated his/her understanding and acceptance.       Plan Discussed with: CRNA and Surgeon  Anesthesia Plan Comments: (PAT note written 03/17/2020 by Myra Gianotti, PA-C. )       Anesthesia Quick Evaluation

## 2020-03-22 ENCOUNTER — Other Ambulatory Visit (HOSPITAL_COMMUNITY)
Admission: RE | Admit: 2020-03-22 | Discharge: 2020-03-22 | Disposition: A | Discharge status: Home / Self Care | Payer: Medicare Other | Source: Ambulatory Visit | Attending: Neurosurgery | Admitting: Neurosurgery

## 2020-03-22 DIAGNOSIS — Z01812 Encounter for preprocedural laboratory examination: Secondary | ICD-10-CM | POA: Insufficient documentation

## 2020-03-22 DIAGNOSIS — Z20822 Contact with and (suspected) exposure to covid-19: Secondary | ICD-10-CM | POA: Insufficient documentation

## 2020-03-22 LAB — SARS CORONAVIRUS 2 (TAT 6-24 HRS): SARS Coronavirus 2: NEGATIVE

## 2020-03-23 ENCOUNTER — Observation Stay (HOSPITAL_COMMUNITY)
Admission: RE | Admit: 2020-03-23 | Discharge: 2020-03-24 | Disposition: A | Discharge status: Home / Self Care | Payer: Medicare Other | Attending: Neurosurgery | Admitting: Neurosurgery

## 2020-03-23 ENCOUNTER — Inpatient Hospital Stay (HOSPITAL_COMMUNITY): Payer: Medicare Other | Admitting: Physician Assistant

## 2020-03-23 ENCOUNTER — Other Ambulatory Visit: Payer: Self-pay

## 2020-03-23 ENCOUNTER — Inpatient Hospital Stay (HOSPITAL_COMMUNITY): Payer: Medicare Other

## 2020-03-23 ENCOUNTER — Inpatient Hospital Stay (HOSPITAL_COMMUNITY): Payer: Medicare Other | Admitting: Certified Registered"

## 2020-03-23 ENCOUNTER — Encounter (HOSPITAL_COMMUNITY)
Admission: RE | Disposition: A | Discharge status: Home / Self Care | Payer: Self-pay | Source: Home / Self Care | Attending: Neurosurgery

## 2020-03-23 DIAGNOSIS — Z96653 Presence of artificial knee joint, bilateral: Secondary | ICD-10-CM | POA: Diagnosis not present

## 2020-03-23 DIAGNOSIS — M4326 Fusion of spine, lumbar region: Secondary | ICD-10-CM | POA: Diagnosis not present

## 2020-03-23 DIAGNOSIS — M5126 Other intervertebral disc displacement, lumbar region: Secondary | ICD-10-CM | POA: Diagnosis not present

## 2020-03-23 DIAGNOSIS — Z79899 Other long term (current) drug therapy: Secondary | ICD-10-CM | POA: Diagnosis not present

## 2020-03-23 DIAGNOSIS — M5416 Radiculopathy, lumbar region: Secondary | ICD-10-CM | POA: Diagnosis not present

## 2020-03-23 DIAGNOSIS — Z981 Arthrodesis status: Secondary | ICD-10-CM | POA: Diagnosis not present

## 2020-03-23 DIAGNOSIS — I10 Essential (primary) hypertension: Secondary | ICD-10-CM | POA: Insufficient documentation

## 2020-03-23 DIAGNOSIS — Z419 Encounter for procedure for purposes other than remedying health state, unspecified: Secondary | ICD-10-CM

## 2020-03-23 DIAGNOSIS — M48062 Spinal stenosis, lumbar region with neurogenic claudication: Secondary | ICD-10-CM | POA: Diagnosis not present

## 2020-03-23 SURGERY — POSTERIOR LUMBAR FUSION 1 LEVEL
Anesthesia: General

## 2020-03-23 MED ORDER — PROPOFOL 10 MG/ML IV BOLUS
INTRAVENOUS | Status: DC | PRN
  Administered 2020-03-23: 130 mg via INTRAVENOUS

## 2020-03-23 MED ORDER — GABAPENTIN 600 MG PO TABS
600.0000 mg | ORAL_TABLET | Freq: Every evening | ORAL | Status: DC | PRN
  Administered 2020-03-23: 600 mg via ORAL
  Filled 2020-03-23: qty 1

## 2020-03-23 MED ORDER — PHENYLEPHRINE HCL-NACL 10-0.9 MG/250ML-% IV SOLN
INTRAVENOUS | Status: DC | PRN
  Administered 2020-03-23: 25 ug/min via INTRAVENOUS

## 2020-03-23 MED ORDER — ACETAMINOPHEN 500 MG PO TABS
1000.0000 mg | ORAL_TABLET | Freq: Four times a day (QID) | ORAL | Status: AC
  Administered 2020-03-23 – 2020-03-24 (×4): 1000 mg via ORAL
  Filled 2020-03-23 (×4): qty 2

## 2020-03-23 MED ORDER — LACTATED RINGERS IV SOLN: INTRAVENOUS | Status: DC

## 2020-03-23 MED ORDER — CHLORHEXIDINE GLUCONATE 0.12 % MT SOLN
15.0000 mL | Freq: Once | OROMUCOSAL | Status: AC
  Administered 2020-03-23: 15 mL via OROMUCOSAL
  Filled 2020-03-23: qty 15

## 2020-03-23 MED ORDER — SODIUM CHLORIDE 0.9 % IV SOLN
INTRAVENOUS | Status: DC | PRN
  Administered 2020-03-23: 500 mL

## 2020-03-23 MED ORDER — BUPIVACAINE LIPOSOME 1.3 % IJ SUSP
20.0000 mL | Freq: Once | INTRAMUSCULAR | Status: AC
  Administered 2020-03-23: 20 mL
  Filled 2020-03-23: qty 20

## 2020-03-23 MED ORDER — BACITRACIN ZINC 500 UNIT/GM EX OINT
TOPICAL_OINTMENT | CUTANEOUS | Status: DC | PRN
  Administered 2020-03-23: 1 via TOPICAL

## 2020-03-23 MED ORDER — MIDAZOLAM HCL 2 MG/2ML IJ SOLN
INTRAMUSCULAR | Status: AC
  Filled 2020-03-23: qty 2

## 2020-03-23 MED ORDER — FENTANYL CITRATE (PF) 250 MCG/5ML IJ SOLN
INTRAMUSCULAR | Status: AC
  Filled 2020-03-23: qty 5

## 2020-03-23 MED ORDER — EPHEDRINE SULFATE-NACL 50-0.9 MG/10ML-% IV SOSY
PREFILLED_SYRINGE | INTRAVENOUS | Status: DC | PRN
  Administered 2020-03-23: 5 mg via INTRAVENOUS

## 2020-03-23 MED ORDER — SODIUM CHLORIDE 0.9% FLUSH: 3.0000 mL | INTRAVENOUS | Status: DC | PRN

## 2020-03-23 MED ORDER — MENTHOL 3 MG MT LOZG: 1.0000 | LOZENGE | OROMUCOSAL | Status: DC | PRN

## 2020-03-23 MED ORDER — MEPERIDINE HCL 25 MG/ML IJ SOLN: 6.2500 mg | INTRAMUSCULAR | Status: DC | PRN

## 2020-03-23 MED ORDER — THROMBIN 20000 UNITS EX SOLR
CUTANEOUS | Status: AC
  Filled 2020-03-23: qty 20000

## 2020-03-23 MED ORDER — DEXAMETHASONE SODIUM PHOSPHATE 10 MG/ML IJ SOLN
INTRAMUSCULAR | Status: AC
  Filled 2020-03-23: qty 1

## 2020-03-23 MED ORDER — SODIUM CHLORIDE 0.9 % IV SOLN
250.0000 mL | INTRAVENOUS | Status: DC
  Administered 2020-03-23: 250 mL via INTRAVENOUS

## 2020-03-23 MED ORDER — ONDANSETRON HCL 4 MG/2ML IJ SOLN: 4.0000 mg | Freq: Once | INTRAMUSCULAR | Status: DC | PRN

## 2020-03-23 MED ORDER — OXYCODONE HCL 5 MG PO TABS
ORAL_TABLET | ORAL | Status: AC
  Filled 2020-03-23: qty 1

## 2020-03-23 MED ORDER — ROCURONIUM BROMIDE 10 MG/ML (PF) SYRINGE
PREFILLED_SYRINGE | INTRAVENOUS | Status: AC
  Filled 2020-03-23: qty 20

## 2020-03-23 MED ORDER — PHENYLEPHRINE 40 MCG/ML (10ML) SYRINGE FOR IV PUSH (FOR BLOOD PRESSURE SUPPORT)
PREFILLED_SYRINGE | INTRAVENOUS | Status: AC
  Filled 2020-03-23: qty 10

## 2020-03-23 MED ORDER — ONDANSETRON HCL 4 MG/2ML IJ SOLN: 4.0000 mg | Freq: Four times a day (QID) | INTRAMUSCULAR | Status: DC | PRN

## 2020-03-23 MED ORDER — CHLORHEXIDINE GLUCONATE CLOTH 2 % EX PADS: 6.0000 | MEDICATED_PAD | Freq: Once | CUTANEOUS | Status: DC

## 2020-03-23 MED ORDER — EPHEDRINE SULFATE 50 MG/ML IJ SOLN
INTRAMUSCULAR | Status: DC | PRN
  Administered 2020-03-23: 5 mg via INTRAVENOUS

## 2020-03-23 MED ORDER — BACITRACIN ZINC 500 UNIT/GM EX OINT
TOPICAL_OINTMENT | CUTANEOUS | Status: AC
  Filled 2020-03-23: qty 28.35

## 2020-03-23 MED ORDER — CEFAZOLIN SODIUM-DEXTROSE 2-4 GM/100ML-% IV SOLN
2.0000 g | Freq: Three times a day (TID) | INTRAVENOUS | Status: AC
  Administered 2020-03-23 – 2020-03-24 (×2): 2 g via INTRAVENOUS
  Filled 2020-03-23 (×2): qty 100

## 2020-03-23 MED ORDER — DOCUSATE SODIUM 100 MG PO CAPS
100.0000 mg | ORAL_CAPSULE | Freq: Two times a day (BID) | ORAL | Status: DC
  Administered 2020-03-23 – 2020-03-24 (×3): 100 mg via ORAL
  Filled 2020-03-23 (×3): qty 1

## 2020-03-23 MED ORDER — ORAL CARE MOUTH RINSE: 15.0000 mL | Freq: Once | OROMUCOSAL | Status: AC

## 2020-03-23 MED ORDER — OXYCODONE HCL 5 MG PO TABS
5.0000 mg | ORAL_TABLET | ORAL | Status: DC | PRN
  Administered 2020-03-23 – 2020-03-24 (×4): 5 mg via ORAL
  Filled 2020-03-23 (×3): qty 1

## 2020-03-23 MED ORDER — BISACODYL 10 MG RE SUPP: 10.0000 mg | Freq: Every day | RECTAL | Status: DC | PRN

## 2020-03-23 MED ORDER — FENTANYL CITRATE (PF) 250 MCG/5ML IJ SOLN
INTRAMUSCULAR | Status: DC | PRN
  Administered 2020-03-23 (×3): 50 ug via INTRAVENOUS
  Administered 2020-03-23: 150 ug via INTRAVENOUS

## 2020-03-23 MED ORDER — CEFAZOLIN SODIUM-DEXTROSE 2-4 GM/100ML-% IV SOLN
2.0000 g | INTRAVENOUS | Status: AC
  Administered 2020-03-23: 2 g via INTRAVENOUS
  Filled 2020-03-23: qty 100

## 2020-03-23 MED ORDER — LIDOCAINE 2% (20 MG/ML) 5 ML SYRINGE
INTRAMUSCULAR | Status: AC
  Filled 2020-03-23: qty 5

## 2020-03-23 MED ORDER — ACETAMINOPHEN 325 MG PO TABS: 650.0000 mg | ORAL_TABLET | ORAL | Status: DC | PRN

## 2020-03-23 MED ORDER — MIDAZOLAM HCL 5 MG/5ML IJ SOLN
INTRAMUSCULAR | Status: DC | PRN
  Administered 2020-03-23: 1 mg via INTRAVENOUS

## 2020-03-23 MED ORDER — ACETAMINOPHEN 650 MG RE SUPP: 650.0000 mg | RECTAL | Status: DC | PRN

## 2020-03-23 MED ORDER — LACTATED RINGERS IV SOLN: INTRAVENOUS | Status: DC | PRN

## 2020-03-23 MED ORDER — BUPIVACAINE-EPINEPHRINE 0.5% -1:200000 IJ SOLN
INTRAMUSCULAR | Status: AC
  Filled 2020-03-23: qty 1

## 2020-03-23 MED ORDER — THROMBIN 5000 UNITS EX SOLR
CUTANEOUS | Status: AC
  Filled 2020-03-23: qty 5000

## 2020-03-23 MED ORDER — ONDANSETRON HCL 4 MG PO TABS: 4.0000 mg | ORAL_TABLET | Freq: Four times a day (QID) | ORAL | Status: DC | PRN

## 2020-03-23 MED ORDER — SUGAMMADEX SODIUM 200 MG/2ML IV SOLN
INTRAVENOUS | Status: DC | PRN
  Administered 2020-03-23: 200 mg via INTRAVENOUS

## 2020-03-23 MED ORDER — PHENYLEPHRINE 40 MCG/ML (10ML) SYRINGE FOR IV PUSH (FOR BLOOD PRESSURE SUPPORT)
PREFILLED_SYRINGE | INTRAVENOUS | Status: DC | PRN
  Administered 2020-03-23 (×3): 80 ug via INTRAVENOUS

## 2020-03-23 MED ORDER — 0.9 % SODIUM CHLORIDE (POUR BTL) OPTIME
TOPICAL | Status: DC | PRN
  Administered 2020-03-23: 1000 mL

## 2020-03-23 MED ORDER — OXYCODONE HCL 5 MG PO TABS
10.0000 mg | ORAL_TABLET | ORAL | Status: DC | PRN
  Filled 2020-03-23: qty 2

## 2020-03-23 MED ORDER — BUPIVACAINE-EPINEPHRINE (PF) 0.5% -1:200000 IJ SOLN
INTRAMUSCULAR | Status: DC | PRN
  Administered 2020-03-23: 10 mL via PERINEURAL

## 2020-03-23 MED ORDER — ONDANSETRON HCL 4 MG/2ML IJ SOLN
INTRAMUSCULAR | Status: AC
  Filled 2020-03-23: qty 2

## 2020-03-23 MED ORDER — DEXAMETHASONE SODIUM PHOSPHATE 10 MG/ML IJ SOLN
INTRAMUSCULAR | Status: DC | PRN
  Administered 2020-03-23: 5 mg via INTRAVENOUS

## 2020-03-23 MED ORDER — CYCLOBENZAPRINE HCL 10 MG PO TABS
10.0000 mg | ORAL_TABLET | Freq: Three times a day (TID) | ORAL | Status: DC | PRN
  Administered 2020-03-23 – 2020-03-24 (×3): 10 mg via ORAL
  Filled 2020-03-23 (×2): qty 1

## 2020-03-23 MED ORDER — PHENOL 1.4 % MT LIQD: 1.0000 | OROMUCOSAL | Status: DC | PRN

## 2020-03-23 MED ORDER — IRBESARTAN 150 MG PO TABS
150.0000 mg | ORAL_TABLET | Freq: Every day | ORAL | Status: DC
  Administered 2020-03-23 – 2020-03-24 (×2): 150 mg via ORAL
  Filled 2020-03-23 (×2): qty 1

## 2020-03-23 MED ORDER — SODIUM CHLORIDE 0.9% FLUSH
3.0000 mL | Freq: Two times a day (BID) | INTRAVENOUS | Status: DC
  Administered 2020-03-23 (×2): 3 mL via INTRAVENOUS

## 2020-03-23 MED ORDER — THROMBIN 20000 UNITS EX SOLR
CUTANEOUS | Status: DC | PRN
  Administered 2020-03-23: 20 mL via TOPICAL

## 2020-03-23 MED ORDER — THROMBIN 5000 UNITS EX SOLR
OROMUCOSAL | Status: DC | PRN
  Administered 2020-03-23: 5 mL via TOPICAL

## 2020-03-23 MED ORDER — CYCLOBENZAPRINE HCL 10 MG PO TABS
ORAL_TABLET | ORAL | Status: AC
  Filled 2020-03-23: qty 1

## 2020-03-23 MED ORDER — EPHEDRINE 5 MG/ML INJ
INTRAVENOUS | Status: AC
  Filled 2020-03-23: qty 10

## 2020-03-23 MED ORDER — ATORVASTATIN CALCIUM 10 MG PO TABS
20.0000 mg | ORAL_TABLET | Freq: Every day | ORAL | Status: DC
  Administered 2020-03-23: 20 mg via ORAL
  Filled 2020-03-23: qty 2

## 2020-03-23 MED ORDER — HYDROMORPHONE HCL 1 MG/ML IJ SOLN: 0.2500 mg | INTRAMUSCULAR | Status: DC | PRN

## 2020-03-23 MED ORDER — PROPOFOL 10 MG/ML IV BOLUS
INTRAVENOUS | Status: AC
  Filled 2020-03-23: qty 20

## 2020-03-23 MED ORDER — AMLODIPINE BESYLATE 5 MG PO TABS
5.0000 mg | ORAL_TABLET | Freq: Every day | ORAL | Status: DC
  Administered 2020-03-24: 5 mg via ORAL
  Filled 2020-03-23: qty 1

## 2020-03-23 MED ORDER — MORPHINE SULFATE (PF) 4 MG/ML IV SOLN: 4.0000 mg | INTRAVENOUS | Status: DC | PRN

## 2020-03-23 MED ORDER — SERTRALINE HCL 25 MG PO TABS
25.0000 mg | ORAL_TABLET | Freq: Every day | ORAL | Status: DC
  Administered 2020-03-24: 25 mg via ORAL
  Filled 2020-03-23: qty 1

## 2020-03-23 MED ORDER — ROCURONIUM BROMIDE 10 MG/ML (PF) SYRINGE
PREFILLED_SYRINGE | INTRAVENOUS | Status: DC | PRN
  Administered 2020-03-23: 30 mg via INTRAVENOUS
  Administered 2020-03-23: 10 mg via INTRAVENOUS
  Administered 2020-03-23: 100 mg via INTRAVENOUS

## 2020-03-23 SURGICAL SUPPLY — 66 items
BAG DECANTER FOR FLEXI CONT (MISCELLANEOUS) ×3 IMPLANT
BASKET BONE COLLECTION (BASKET) ×3 IMPLANT
BENZOIN TINCTURE PRP APPL 2/3 (GAUZE/BANDAGES/DRESSINGS) ×3 IMPLANT
BLADE CLIPPER SURG (BLADE) IMPLANT
BUR MATCHSTICK NEURO 3.0 LAGG (BURR) ×3 IMPLANT
BUR PRECISION FLUTE 6.0 (BURR) ×3 IMPLANT
CAGE ALTERA 10X31X9-13 15D (Cage) ×2 IMPLANT
CAGE ALTERA 9-13-15-31MM (Cage) ×1 IMPLANT
CANISTER SUCT 3000ML PPV (MISCELLANEOUS) ×3 IMPLANT
CAP LOCK DLX THRD (Cap) ×12 IMPLANT
CARTRIDGE OIL MAESTRO DRILL (MISCELLANEOUS) ×1 IMPLANT
CLOSURE WOUND 1/2 X4 (GAUZE/BANDAGES/DRESSINGS) ×1
CNTNR URN SCR LID CUP LEK RST (MISCELLANEOUS) ×1 IMPLANT
CONT SPEC 4OZ STRL OR WHT (MISCELLANEOUS) ×3
COVER BACK TABLE 60X90IN (DRAPES) ×3 IMPLANT
COVER WAND RF STERILE (DRAPES) ×3 IMPLANT
DECANTER SPIKE VIAL GLASS SM (MISCELLANEOUS) ×3 IMPLANT
DERMABOND ADVANCED (GAUZE/BANDAGES/DRESSINGS) ×2
DERMABOND ADVANCED .7 DNX12 (GAUZE/BANDAGES/DRESSINGS) ×1 IMPLANT
DIFFUSER DRILL AIR PNEUMATIC (MISCELLANEOUS) ×3 IMPLANT
DRAPE C-ARM 42X72 X-RAY (DRAPES) ×6 IMPLANT
DRAPE HALF SHEET 40X57 (DRAPES) ×3 IMPLANT
DRAPE LAPAROTOMY 100X72X124 (DRAPES) ×3 IMPLANT
DRAPE SURG 17X23 STRL (DRAPES) ×12 IMPLANT
DRSG OPSITE POSTOP 4X6 (GAUZE/BANDAGES/DRESSINGS) ×3 IMPLANT
ELECT BLADE 4.0 EZ CLEAN MEGAD (MISCELLANEOUS) ×3
ELECT REM PT RETURN 9FT ADLT (ELECTROSURGICAL) ×3
ELECTRODE BLDE 4.0 EZ CLN MEGD (MISCELLANEOUS) ×1 IMPLANT
ELECTRODE REM PT RTRN 9FT ADLT (ELECTROSURGICAL) ×1 IMPLANT
EVACUATOR 1/8 PVC DRAIN (DRAIN) IMPLANT
GAUZE 4X4 16PLY RFD (DISPOSABLE) ×3 IMPLANT
GAUZE SPONGE 4X4 12PLY STRL (GAUZE/BANDAGES/DRESSINGS) ×3 IMPLANT
GLOVE BIO SURGEON STRL SZ8 (GLOVE) ×6 IMPLANT
GLOVE BIO SURGEON STRL SZ8.5 (GLOVE) ×6 IMPLANT
GLOVE EXAM NITRILE XL STR (GLOVE) IMPLANT
GOWN STRL REUS W/ TWL LRG LVL3 (GOWN DISPOSABLE) ×2 IMPLANT
GOWN STRL REUS W/ TWL XL LVL3 (GOWN DISPOSABLE) ×2 IMPLANT
GOWN STRL REUS W/TWL 2XL LVL3 (GOWN DISPOSABLE) IMPLANT
GOWN STRL REUS W/TWL LRG LVL3 (GOWN DISPOSABLE) ×6
GOWN STRL REUS W/TWL XL LVL3 (GOWN DISPOSABLE) ×6
HEMOSTAT POWDER KIT SURGIFOAM (HEMOSTASIS) ×3 IMPLANT
KIT BASIN OR (CUSTOM PROCEDURE TRAY) ×3 IMPLANT
KIT TURNOVER KIT B (KITS) ×3 IMPLANT
MILL MEDIUM DISP (BLADE) ×3 IMPLANT
NEEDLE HYPO 21X1.5 SAFETY (NEEDLE) ×3 IMPLANT
NEEDLE HYPO 22GX1.5 SAFETY (NEEDLE) ×3 IMPLANT
NS IRRIG 1000ML POUR BTL (IV SOLUTION) ×3 IMPLANT
OIL CARTRIDGE MAESTRO DRILL (MISCELLANEOUS) ×3
PACK LAMINECTOMY NEURO (CUSTOM PROCEDURE TRAY) ×3 IMPLANT
PAD ARMBOARD 7.5X6 YLW CONV (MISCELLANEOUS) IMPLANT
PATTIES SURGICAL .5 X1 (DISPOSABLE) IMPLANT
PUTTY DBM 10CC CALC GRAN (Putty) ×3 IMPLANT
ROD CURVED TI 6.35X45 (Rod) ×6 IMPLANT
SCREW PA DLX CREO 7.5X55 (Screw) ×12 IMPLANT
SPONGE LAP 4X18 RFD (DISPOSABLE) IMPLANT
SPONGE NEURO XRAY DETECT 1X3 (DISPOSABLE) IMPLANT
SPONGE SURGIFOAM ABS GEL 100 (HEMOSTASIS) ×3 IMPLANT
STRIP CLOSURE SKIN 1/2X4 (GAUZE/BANDAGES/DRESSINGS) ×2 IMPLANT
SUT VIC AB 1 CT1 18XBRD ANBCTR (SUTURE) ×2 IMPLANT
SUT VIC AB 1 CT1 8-18 (SUTURE) ×6
SUT VIC AB 2-0 CP2 18 (SUTURE) ×6 IMPLANT
SYR 20ML LL LF (SYRINGE) ×3 IMPLANT
TOWEL GREEN STERILE (TOWEL DISPOSABLE) ×3 IMPLANT
TOWEL GREEN STERILE FF (TOWEL DISPOSABLE) ×3 IMPLANT
TRAY FOLEY MTR SLVR 16FR STAT (SET/KITS/TRAYS/PACK) ×3 IMPLANT
WATER STERILE IRR 1000ML POUR (IV SOLUTION) ×3 IMPLANT

## 2020-03-23 NOTE — Evaluation (Signed)
Physical Therapy Evaluation Patient Details Name: Micheal [name] MRN: [id number] DOB: 1946-02-28 Today's Date: 03/23/2020   History of Present Illness  Patient is a 74 y/o male with PMH HTN, hyperliipidemia, OSA, OS s/p TKA x 2, prostate CA, GERD, benign essential tremor admitted for L2-3 spinal stenosis now s/p decompression and fusion.  Clinical Impression  Patient presents with mobility at S level for back precautions and due to pain.  He will have wife to assist at home and has cane he used after knee surgery.  No further skilled PT or DME needs at this time.    Follow Up Recommendations No PT follow up    Equipment Recommendations  None recommended by PT    Recommendations for Other Services       Precautions / Restrictions Precautions Precautions: Fall;Back Precaution Booklet Issued: Yes (comment) Precaution Comments: reviewed precautions with pt Required Braces or Orthoses: Spinal Brace Spinal Brace: Lumbar corset;Applied in sitting position      Mobility  Bed Mobility Overal bed mobility: Needs Assistance Bed Mobility: Rolling;Sidelying to Sit;Sit to Sidelying Rolling: Supervision Sidelying to sit: Supervision     Sit to sidelying: Supervision General bed mobility comments: educated on technique  Transfers Overall transfer level: Needs assistance Equipment used: Straight cane Transfers: Sit to/from Stand Sit to Stand: Min guard         General transfer comment: up from EOB effortful with cane, minguard for safety  Ambulation/Gait Ambulation/Gait assistance: Supervision Gait Distance (Feet): 170 Feet Assistive device: Straight cane Gait Pattern/deviations: Step-through pattern;Step-to pattern;Wide base of support     General Gait Details: in L hand to start, then transferred cane to R hand and cues for technique/sequence  Stairs            Wheelchair Mobility    Modified Rankin (Stroke Patients Only)       Balance Overall balance  assessment: Mild deficits observed, not formally tested                                           Pertinent Vitals/Pain Pain Assessment: 0-10 Pain Score: 7  Pain Location: surgical site Pain Descriptors / Indicators: Operative site guarding Pain Intervention(s): Monitored during session;Repositioned    Home Living Family/patient expects to be discharged to:: Private residence Living Arrangements: Spouse/significant other Available Help at Discharge: Family Type of Home: House Home Access: Stairs to enter Entrance Stairs-Rails: None Entrance Stairs-Number of Steps: 3 Home Layout: Multi-level;Able to live on main level with bedroom/bathroom Home Equipment: Kasandra Knudsen - single point      Prior Function Level of Independence: Independent         Comments: likes to work in the yard     [employer]        Extremity/Trunk Assessment        Lower Extremity Assessment Lower Extremity Assessment: LLE deficits/detail LLE Deficits / Details: noted decreased ankle DF strength and ROM compared to R, but no appreciable foot drop with ambulation, numbness on posterior calf       Communication   Communication: No difficulties  Cognition Arousal/Alertness: Awake/alert Behavior During Therapy: WFL for tasks assessed/performed Overall Cognitive Status: Within Functional Limits for tasks assessed  General Comments General comments (skin integrity, edema, etc.): Educated on steps for home entry and on car transfers.    Exercises     Assessment/Plan    PT Assessment Patient needs continued PT services;Patent does not need any further PT services  PT Problem List         PT Treatment Interventions      PT Goals (Current goals can be found in the Care Plan section)  Acute Rehab PT Goals PT Goal Formulation: All assessment and education complete, DC therapy    Frequency     Barriers to discharge         Co-evaluation               AM-PAC PT "6 Clicks" Mobility  Outcome Measure Help needed turning from your back to your side while in a flat bed without using bedrails?: None Help needed moving from lying on your back to sitting on the side of a flat bed without using bedrails?: None Help needed moving to and from a bed to a chair (including a wheelchair)?: None Help needed standing up from a chair using your arms (e.g., wheelchair or bedside chair)?: A Little Help needed to walk in hospital room?: None Help needed climbing 3-5 steps with a railing? : A Little 6 Click Score: 22    End of Session   Activity Tolerance: Patient tolerated treatment well Patient left: in bed;with call bell/phone within reach   PT Visit Diagnosis: Difficulty in walking, not elsewhere classified (R26.2)    Time: 6720-9470 PT Time Calculation (min) (ACUTE ONLY): 16 min   Charges:   PT Evaluation $PT Eval Low Complexity: 1 Low          Magda Kiel, PT Acute Rehabilitation Services Pager:[phone] Office:[phone] 03/23/2020   Reginia Naas 03/23/2020, 5:55 PM

## 2020-03-23 NOTE — H&P (Signed)
Subjective: The patient is a 74 year old white male who has complained of back and right greater left leg pain consistent with neurogenic claudication.  He has failed medical management and was worked up with a lumbar MRI which demonstrated severe spinal stenosis at L2-3.  I discussed the various treatment options with him.  He has decided to proceed with surgery.  Past Medical History:  Diagnosis Date  . Anxiety   . Benign essential tremor    hands  . Cancer [facility])    prostate cancer; skin cancers  . Complication of anesthesia 1999   spinal with last knee replacement  . Diverticulitis    last Saturday 03/24/16 -tx with antibiotics  . GERD (gastroesophageal reflux disease)   . H/O balanitis    recurrent intermittant-- uses diprolene cream prn  . History of colon polyps   . History of kidney stones   . History of prostate cancer urologist-  dr Alinda Money-  currently PSA nondetectable 10/ 2016   s/p  radial prostatectomy w/ nerve sparing 07-17-2007/   stage T1c,  Gleason 3+3=6,  PSA 6.33  . Hyperlipidemia   . Hypertension   . Mild obstructive sleep apnea    uses mouth guard only  . Nocturia   . Osteoarthritis    knees  . Sigmoid diverticulosis   . Synovial hypertrophy of left knee   . Urge urinary incontinence     Past Surgical History:  Procedure Laterality Date  . BACK SURGERY     L4 surgery  . CARDIOVASCULAR STRESS TEST  08-04-2014   normal perfusion nuclear study/  normal LV function and wall motion , ef 54%  . COLONOSCOPY W/ POLYPECTOMY  06-21-2014  . DIAGNOSTIC LAPAROSCOPY    . FRACTURE SURGERY  1975   right thumb  . JOINT REPLACEMENT    . KNEE ARTHROSCOPY Bilateral right 07-31-2009//  left   . KNEE ARTHROSCOPY Left 12/08/2014   Procedure: ARTHROSCOPY LEFT KNEE WITH SYNOVECTOMY;  Surgeon: Gaynelle Arabian, MD;  Location[facility];  Service: Orthopedics;  Laterality: Left;  . KNEE ARTHROSCOPY Left 02/14/2016   Procedure: ARTHROSCOPY KNEE WTH SYNOVECTOMY;   Surgeon: Gaynelle Arabian, MD;  Location[facility];  Service: Orthopedics;  Laterality: Left;  . LUMBAR DISC SURGERY  04-13-2002   left  L4 -- L5  . ROBOT ASSISTED LAPAROSCOPIC RADICAL PROSTATECTOMY  07-17-2007  . TONSILLECTOMY  as child  . TOTAL KNEE ARTHROPLASTY Left 03-08-2008  . TOTAL KNEE ARTHROPLASTY Right 04/02/2016   Procedure: RIGHT TOTAL KNEE ARTHROPLASTY;  Surgeon: Gaynelle Arabian, MD;  Location: [facility];  Service: Orthopedics;  Laterality: Right;    No Known Allergies  Social History   Tobacco Use  . Smoking status: Never Smoker  . Smokeless tobacco: Never Used  Substance Use Topics  . Alcohol use: Yes    Comment: social 1-2 weekly    Family History  Problem Relation Age of Onset  . Sudden death Mother   . Aneurysm Father   . Heart attack Other        grandfather   Prior to Admission medications   Medication Sig Start Date End Date Taking? Authorizing Provider  acetaminophen (TYLENOL) 500 MG tablet Take 1,000 mg by mouth every 6 (six) hours as needed for mild pain.    Yes [provider]  amLODipine (NORVASC) 5 MG tablet Take 1 tablet (5 mg total) by mouth daily. 11/03/19  Yes Burchette, Alinda Sierras, MD  atorvastatin (LIPITOR) 20 MG tablet TAKE 1 TABLET BY MOUTH  DAILY AT 6 PM Patient taking differently: Take 20 mg by mouth daily in the afternoon.  08/07/19  Yes Burchette, Alinda Sierras, MD  gabapentin (NEURONTIN) 600 MG tablet Take 600 mg by mouth at bedtime and may repeat dose one time if needed.   Yes [provider]  ibuprofen (ADVIL) 200 MG tablet Take 200-400 mg by mouth every 8 (eight) hours as needed (PAIN.).   Yes [provider]  olmesartan (BENICAR) 20 MG tablet Take 1 tablet (20 mg total) by mouth daily. 11/03/19  Yes Burchette, Alinda Sierras, MD  Probiotic Product (PROBIOTIC PO) Take 1 capsule by mouth daily.   Yes [provider]  sertraline (ZOLOFT) 50 MG tablet TAKE 1 TABLET BY MOUTH  DAILY Patient taking differently: Take  25 mg by mouth daily.  05/11/19  Yes Burchette, Alinda Sierras, MD  Zoster Vaccine Adjuvanted [facility]) injection Shingrix (PF) 50 mcg/0.5 mL intramuscular suspension, kit    [provider]     Review of Systems  Positive ROS: As above  All other systems have been reviewed and were otherwise negative with the exception of those mentioned in the HPI and as above.  Objective: Vital signs in last 24 hours:   Estimated body mass index is 28.18 kg/m as calculated from the following:   Height as of 03/16/20: '5\' 10"'  (1.778 m).   Weight as of 03/16/20: 89.1 kg.   General Appearance: Alert Head: Normocephalic, without obvious abnormality, atraumatic Eyes: PERRL, conjunctiva/corneas clear, EOM's intact,    Ears: Normal  Throat: Normal  Neck: Supple, Back: unremarkable Lungs: Clear to auscultation bilaterally, respirations unlabored Heart: Regular rate and rhythm, no murmur, rub or gallop Abdomen: Soft, non-tender Extremities: Extremities normal, atraumatic, no cyanosis or edema Skin: unremarkable  NEUROLOGIC:   Mental status: alert and oriented,Motor Exam - grossly normal Sensory Exam - grossly normal Reflexes:  Coordination - grossly normal Gait - grossly normal Balance - grossly normal Cranial Nerves: I: smell Not tested  II: visual acuity  OS: Normal  OD: Normal   II: visual fields Full to confrontation  II: pupils Equal, round, reactive to light  III,VII: ptosis None  III,IV,VI: extraocular muscles  Full ROM  V: mastication Normal  V: facial light touch sensation  Normal  V,VII: corneal reflex  Present  VII: facial muscle function - upper  Normal  VII: facial muscle function - lower Normal  VIII: hearing Not tested  IX: soft palate elevation  Normal  IX,X: gag reflex Present  XI: trapezius strength  5/5  XI: sternocleidomastoid strength 5/5  XI: neck flexion strength  5/5  XII: tongue strength  Normal    Data Review Lab Results  Component Value Date   WBC  7.4 03/16/2020   HGB 14.9 03/16/2020   HCT 43.7 03/16/2020   MCV 95.8 03/16/2020   PLT 245 03/16/2020   Lab Results  Component Value Date   NA 138 03/16/2020   K 4.2 03/16/2020   CL 106 03/16/2020   CO2 26 03/16/2020   BUN 13 03/16/2020   CREATININE 0.83 03/16/2020   GLUCOSE 106 (H) 03/16/2020   Lab Results  Component Value Date   INR 1.08 03/26/2016    Assessment/Plan: L2-3 spinal stenosis, lumbago, lumbar radiculopathy, degenerative disease, facet arthropathy, neurogenic claudication: I have discussed the situation with the patient.  I reviewed his imaging studies with him and pointed out the abnormalities.  We have discussed the various treatment options including surgery.  I have described the surgical treatment option  of an L2-3 decompression, instrumentation and fusion.  I have shown him surgical models.  I have given him a surgical pamphlet.  We have discussed the risk, benefits, alternatives, expected postoperative course, and likelihood of achieving our goals with surgery.  I have answered all his questions.  He has decided to proceed with surgery.   Ophelia Charter 03/23/2020 8:22 AM

## 2020-03-23 NOTE — Anesthesia Procedure Notes (Signed)
Procedure Name: Intubation Date/Time: 03/23/2020 8:41 AM Performed by: Gaylene Brooks, CRNA Pre-anesthesia Checklist: Patient identified, Emergency Drugs available, Suction available and Patient being monitored Patient Re-evaluated:Patient Re-evaluated prior to induction Oxygen Delivery Method: Circle System Utilized Preoxygenation: Pre-oxygenation with 100% oxygen Induction Type: IV induction Ventilation: Mask ventilation without difficulty and Oral airway inserted - appropriate to patient size Laryngoscope Size: Sabra Heck and 2 Grade View: Grade I Tube type: Oral Tube size: 7.5 mm Number of attempts: 1 Airway Equipment and Method: Stylet and Oral airway Placement Confirmation: ETT inserted through vocal cords under direct vision,  positive ETCO2 and breath sounds checked- equal and bilateral Secured at: 22 cm Tube secured with: Tape Dental Injury: Teeth and Oropharynx as per pre-operative assessment

## 2020-03-23 NOTE — Transfer of Care (Signed)
Immediate Anesthesia Transfer of Care Note  Patient: Micheal [name]  Procedure(s) Performed: POSTERIOR LUMBAR INTERBODY FUSION, POSTERIOR INSTRUMENTATION LUMBAR TWO LUMBAR THREE (N/A )  Patient Location: PACU  Anesthesia Type:General  Level of Consciousness: awake, drowsy and patient cooperative  Airway & Oxygen Therapy: Patient Spontanous Breathing and Patient connected to nasal cannula oxygen  Post-op Assessment: Report given to RN, Post -op Vital signs reviewed and stable and Patient moving all extremities X 4  Post vital signs: Reviewed and stable  Last Vitals:  Vitals Value Taken Time  BP 147/73 03/23/20 1216  Temp    Pulse 88 03/23/20 1219  Resp 25 03/23/20 1219  SpO2 89 % 03/23/20 1219  Vitals shown include unvalidated device data.  Last Pain: There were no vitals filed for this visit.       Complications: No complications documented.

## 2020-03-23 NOTE — Op Note (Signed)
Brief history: The patient is a 74 year old white male who has complained of back and right greater than left leg pain consistent with neurogenic claudication/lumbar radiculopathy.  He has failed medical management and was worked up with a lumbar MRI which demonstrated severe multifactorial spinal stenosis and a herniated disc at L2-3.  I discussed the various treatment options with him including surgery.  He has weighed the risks, benefits and alternatives and decided proceed with a L2-3 decompression, instrumentation and fusion.  Preoperative diagnosis: L2-3 herniated disc,  spinal stenosis compressing L3 nerve roots; lumbago; lumbar radiculopathy; neurogenic claudication  Postoperative diagnosis: The same  Procedure: Bilateral L2-3 laminotomy/foraminotomies/medial facetectomy to decompress the bilateral L3 nerve roots(the work required to do this was in addition to the work required to do the posterior lumbar interbody fusion because of the patient's spinal stenosis, facet arthropathy. Etc. requiring a wide decompression of the nerve roots.);  L2-3 transforaminal lumbar interbody fusion with local morselized autograft bone and Zimmer DBM; insertion of interbody prosthesis at L2-3 (globus peek expandable interbody prosthesis); posterior nonsegmental instrumentation from L2 to L3 with globus titanium pedicle screws and rods; posterior lateral arthrodesis at L2-3 with local morselized autograft bone and Zimmer DBM.  Surgeon: Dr. Earle Gell  Asst.: Arnetha Massy, NP  Anesthesia: Gen. endotracheal  Estimated blood loss: 200 cc  Drains: None  Complications: None  Description of procedure: The patient was brought to the operating room by the anesthesia team. General endotracheal anesthesia was induced. The patient was turned to the prone position on the Wilson frame. The patient's lumbosacral region was then prepared with Betadine scrub and Betadine solution. Sterile drapes were applied.  I then  injected the area to be incised with Marcaine with epinephrine solution. I then used the scalpel to make a linear midline incision over the L2-3 interspace. I then used electrocautery to perform a bilateral subperiosteal dissection exposing the spinous process and lamina of L2 and L3. We then obtained intraoperative radiograph to confirm our location. We then inserted the Verstrac retractor to provide exposure.  I began the decompression by using the high speed drill to perform laminotomies at L2-3 bilaterally. We then used the Kerrison punches to widen the laminotomy and removed the ligamentum flavum at L2-3 bilaterally. We used the Kerrison punches to remove the medial facets at L2-3 bilaterally, we remove the right L2-3 facet. We performed wide foraminotomies about the bilateral L3 nerve roots completing the decompression.  We now turned our attention to the posterior lumbar interbody fusion. I used a scalpel to incise the intervertebral disc at L2-3 bilaterally. I then performed a partial intervertebral discectomy at L2-3 bilaterally using the pituitary forceps. We prepared the vertebral endplates at P8-2 bilaterally for the fusion by removing the soft tissues with the curettes. We then used the trial spacers to pick the appropriate sized interbody prosthesis. We prefilled his prosthesis with a combination of local morselized autograft bone that we obtained during the decompression as well as Zimmer DBM. We inserted the prefilled prosthesis into the interspace at L2-3 from the right, we then turned and expanded the prosthesis. There was a good snug fit of the prosthesis in the interspace. We then filled and the remainder of the intervertebral disc space with local morselized autograft bone and Zimmer DBM. This completed the posterior lumbar interbody arthrodesis.  During the decompression and insertion of the prosthesis the assistant protected the thecal sac and nerve roots with the D'Errico retractor.  We  now turned attention to the instrumentation. Under  fluoroscopic guidance we cannulated the bilateral L2 and L3 pedicles with the bone probe. We then removed the bone probe. We then tapped the pedicle with a 6.5 millimeter tap. We then removed the tap. We probed inside the tapped pedicle with a ball probe to rule out cortical breaches. We then inserted a 7.5 x 55 millimeter pedicle screw into the L2 and L3 pedicles bilaterally under fluoroscopic guidance. We then palpated along the medial aspect of the pedicles to rule out cortical breaches. There were none. The nerve roots were not injured. We then connected the unilateral pedicle screws with a lordotic rod. We compressed the construct and secured the rod in place with the caps. We then tightened the caps appropriately. This completed the instrumentation from L2-3 bilaterally.  We now turned our attention to the posterior lateral arthrodesis at L2-3. We used the high-speed drill to decorticate the remainder of the facets, pars, transverse process at L2-3. We then applied a combination of local morselized autograft bone and Zimmer DBM over these decorticated posterior lateral structures. This completed the posterior lateral arthrodesis.  We then obtained hemostasis using bipolar electrocautery. We irrigated the wound out with bacitracin solution. We inspected the thecal sac and nerve roots and noted they were well decompressed. We then removed the retractor.  We injected Exparel . We reapproximated patient's thoracolumbar fascia with interrupted #1 Vicryl suture. We reapproximated patient's subcutaneous tissue with interrupted 2-0 Vicryl suture. The reapproximated patient's skin with Steri-Strips and benzoin. The wound was then coated with bacitracin ointment. A sterile dressing was applied. The drapes were removed. The patient was subsequently returned to the supine position where they were extubated by the anesthesia team. He was then transported to the post  anesthesia care unit in stable condition. All sponge instrument and needle counts were reportedly correct at the end of this case.

## 2020-03-23 NOTE — Progress Notes (Signed)
Orthopedic Tech Progress Note Patient Details:  Micheal [name] [birth year] [id number] Called in order to [employer] for an [employer] Patient ID: Micheal [name], male   DOB: 1946/07/15, 74 y.o.   MRN: [id number]   Janit Pagan 03/23/2020, 12:33 PM

## 2020-03-23 NOTE — Anesthesia Postprocedure Evaluation (Signed)
Anesthesia Post Note  Patient: Micheal [name]  Procedure(s) Performed: POSTERIOR LUMBAR INTERBODY FUSION, POSTERIOR INSTRUMENTATION LUMBAR TWO LUMBAR THREE (N/A )     Patient location during evaluation: PACU Anesthesia Type: General Level of consciousness: awake and alert Pain management: pain level controlled Vital Signs Assessment: post-procedure vital signs reviewed and stable Respiratory status: spontaneous breathing, nonlabored ventilation, respiratory function stable and patient connected to nasal cannula oxygen Cardiovascular status: blood pressure returned to baseline and stable Postop Assessment: no apparent nausea or vomiting Anesthetic complications: no   No complications documented.  Last Vitals:  Vitals:   03/23/20 1230 03/23/20 1312  BP: 123/64 135/74  Pulse: 73 70  Resp: 14 16  Temp:  36.8 C  SpO2: 92% 98%    Last Pain:  Vitals:   03/23/20 1312  TempSrc: Oral  PainSc:                  Evarose Altland DAVID

## 2020-03-24 ENCOUNTER — Encounter (HOSPITAL_COMMUNITY): Payer: Self-pay | Admitting: Neurosurgery

## 2020-03-24 DIAGNOSIS — Z96653 Presence of artificial knee joint, bilateral: Secondary | ICD-10-CM | POA: Diagnosis not present

## 2020-03-24 DIAGNOSIS — M48062 Spinal stenosis, lumbar region with neurogenic claudication: Secondary | ICD-10-CM | POA: Diagnosis not present

## 2020-03-24 DIAGNOSIS — Z79899 Other long term (current) drug therapy: Secondary | ICD-10-CM | POA: Diagnosis not present

## 2020-03-24 DIAGNOSIS — M5126 Other intervertebral disc displacement, lumbar region: Secondary | ICD-10-CM | POA: Diagnosis not present

## 2020-03-24 DIAGNOSIS — I10 Essential (primary) hypertension: Secondary | ICD-10-CM | POA: Diagnosis not present

## 2020-03-24 DIAGNOSIS — M5416 Radiculopathy, lumbar region: Secondary | ICD-10-CM | POA: Diagnosis not present

## 2020-03-24 LAB — CBC
HCT: 38.9 % — ABNORMAL LOW (ref 39.0–52.0)
Hemoglobin: 13.4 g/dL (ref 13.0–17.0)
MCH: 33.3 pg (ref 26.0–34.0)
MCHC: 34.4 g/dL (ref 30.0–36.0)
MCV: 96.5 fL (ref 80.0–100.0)
Platelets: 216 10*3/uL (ref 150–400)
RBC: 4.03 MIL/uL — ABNORMAL LOW (ref 4.22–5.81)
RDW: 13.5 % (ref 11.5–15.5)
WBC: 19.1 10*3/uL — ABNORMAL HIGH (ref 4.0–10.5)
nRBC: 0 % (ref 0.0–0.2)

## 2020-03-24 LAB — BASIC METABOLIC PANEL
Anion gap: 9 (ref 5–15)
BUN: 17 mg/dL (ref 8–23)
CO2: 26 mmol/L (ref 22–32)
Calcium: 8.9 mg/dL (ref 8.9–10.3)
Chloride: 102 mmol/L (ref 98–111)
Creatinine, Ser: 0.91 mg/dL (ref 0.61–1.24)
GFR calc Af Amer: >60 mL/min (ref >60)
GFR calc non Af Amer: >60 mL/min (ref >60)
Glucose, Bld: 143 mg/dL — ABNORMAL HIGH (ref 70–99)
Potassium: 4.2 mmol/L (ref 3.5–5.1)
Sodium: 137 mmol/L (ref 135–145)

## 2020-03-24 MED ORDER — CYCLOBENZAPRINE HCL 10 MG PO TABS: 10.0000 mg | ORAL_TABLET | Freq: Three times a day (TID) | ORAL | 0 refills | Status: DC | PRN

## 2020-03-24 MED ORDER — DOCUSATE SODIUM 100 MG PO CAPS: 100.0000 mg | ORAL_CAPSULE | Freq: Two times a day (BID) | ORAL | 0 refills | Status: DC

## 2020-03-24 MED ORDER — OXYCODONE HCL 5 MG PO TABS: 5.0000 mg | ORAL_TABLET | ORAL | 0 refills | Status: AC | PRN

## 2020-03-24 NOTE — Discharge Summary (Signed)
Physician Discharge Summary     Providing Compassionate, Quality Care - Together   Patient ID: Micheal [name] MRN: [id number] DOB/AGE: 04-30-1946 74 y.o.  Admit date: 03/23/2020 Discharge date: 03/24/2020  Admission Diagnoses: Spinal stenosis of lumbar region with neurogenic claudication  Discharge Diagnoses:  Active Problems:   Spinal stenosis of lumbar region with neurogenic claudication   Discharged Condition: good  Hospital Course: Patient underwent an L2-3 posterior lateral interbody fusion by Dr. [name] on 03/23/2020. He was admitted to 3C06 overnight for observation. His postoperative course has been uncomplicated. He has worked with both physical and occupational therapies who feel patient is ready for discharge home. He is ambulating independently and without difficulty. He is tolerating a normal diet. He is not having any bowel or bladder dysfunction. His pain is well-controlled with oral pain medication. He is ready for discharge home.   Consults: rehabilitation medicine  Significant Diagnostic Studies: radiology: DG Lumbar Spine 2-3 Views  Result Date: 03/23/2020 CLINICAL DATA:  L2-3 posterior fusion EXAM: DG C-ARM 1-60 MIN; LUMBAR SPINE - 2-3 VIEW COMPARISON:  03/23/2020 FINDINGS: Spot fluoroscopic intraoperative views demonstrate interbody disc spacer and bipedicular screws positioned in the lumbar spine at L2-3 when compared to the portable cross-table lateral view from earlier today. Anatomic alignment. IMPRESSION: Intraoperative views during L2-3 posterior fusion. Electronically Signed   By: Jerilynn Mages.  Shick M.D.   On: 03/23/2020 13:42   DG Spine Portable 1 View  Result Date: 03/23/2020 CLINICAL DATA:  Lumbar surgery, intraoperative examination EXAM: PORTABLE SPINE - 1 VIEW COMPARISON:  02/10/2020 FINDINGS: The lumbar spine is visualized from T12-L1 through the sacrum. There is straightening of the lumbar spine. Intervertebral disc space narrowing and endplate remodeling is  noted at L3-4 and L5-S1, similar to that noted on prior examination. Metallic probe is seen overlying the posterior aspect of the L2-3 facet. Soft tissue retractors seen within the posterior soft tissues at this level. Facet arthrosis results in neural foraminal narrowing at L3-4, not well characterized on this exam. IMPRESSION: Metallic probe overlying the posterior aspect of the L2-3 facet. Electronically Signed   By: Fidela Salisbury MD   On: 03/23/2020 09:41   DG C-Arm 1-60 Min  Result Date: 03/23/2020 CLINICAL DATA:  L2-3 posterior fusion EXAM: DG C-ARM 1-60 MIN; LUMBAR SPINE - 2-3 VIEW COMPARISON:  03/23/2020 FINDINGS: Spot fluoroscopic intraoperative views demonstrate interbody disc spacer and bipedicular screws positioned in the lumbar spine at L2-3 when compared to the portable cross-table lateral view from earlier today. Anatomic alignment. IMPRESSION: Intraoperative views during L2-3 posterior fusion. Electronically Signed   By: Jerilynn Mages.  Shick M.D.   On: 03/23/2020 13:42     Treatments: surgery: Bilateral L2-3 laminotomy/foraminotomies/medial facetectomy to decompress the bilateral L3 nerve roots(the work required to do this was in addition to the work required to do the posterior lumbar interbody fusion because of the patient's spinal stenosis, facet arthropathy. Etc. requiring a wide decompression of the nerve roots.);  L2-3 transforaminal lumbar interbody fusion with local morselized autograft bone and Zimmer DBM; insertion of interbody prosthesis at L2-3 (globus peek expandable interbody prosthesis); posterior nonsegmental instrumentation from L2 to L3 with globus titanium pedicle screws and rods; posterior lateral arthrodesis at L2-3 with local morselized autograft bone and Zimmer DBM.  Discharge Exam: Blood pressure 114/63, pulse 63, temperature 98.6 F (37 C), temperature source Oral, resp. rate 18, SpO2 97 %.   Per Dr. [name]' assessment: Pt is alert and oriented x 4 PERRLA CN II-XII  grossly intact MAE, Strength and  sensation intact Incision is covered with Honeycomb dressing and Steri Strips; Dressing is clean, dry, and intact   Disposition: Discharge disposition: 01-Home or Self Care        Allergies as of 03/24/2020   No Known Allergies     Medication List    STOP taking these medications   ibuprofen 200 MG tablet Commonly known as: ADVIL     TAKE these medications   acetaminophen 500 MG tablet Commonly known as: TYLENOL Take 1,000 mg by mouth every 6 (six) hours as needed for mild pain.   amLODipine 5 MG tablet Commonly known as: NORVASC Take 1 tablet (5 mg total) by mouth daily.   atorvastatin 20 MG tablet Commonly known as: LIPITOR TAKE 1 TABLET BY MOUTH  DAILY AT 6 PM What changed: See the new instructions.   cyclobenzaprine 10 MG tablet Commonly known as: FLEXERIL Take 1 tablet (10 mg total) by mouth 3 (three) times daily as needed for muscle spasms.   docusate sodium 100 MG capsule Commonly known as: COLACE Take 1 capsule (100 mg total) by mouth 2 (two) times daily.   gabapentin 600 MG tablet Commonly known as: NEURONTIN Take 600 mg by mouth at bedtime and may repeat dose one time if needed.   olmesartan 20 MG tablet Commonly known as: BENICAR Take 1 tablet (20 mg total) by mouth daily.   oxyCODONE 5 MG immediate release tablet Commonly known as: Oxy IR/ROXICODONE Take 1 tablet (5 mg total) by mouth every 4 (four) hours as needed for moderate pain ((score 4 to 6)).   PROBIOTIC PO Take 1 capsule by mouth daily.   sertraline 50 MG tablet Commonly known as: ZOLOFT TAKE 1 TABLET BY MOUTH  DAILY What changed: how much to take   Shingrix injection Generic drug: Zoster Vaccine Adjuvanted Shingrix (PF) 50 mcg/0.5 mL intramuscular suspension, kit       Follow-up Information    Newman Pies, MD. Schedule an appointment as soon as possible for a visit in 2 week(s).   Specialty: Neurosurgery Contact information: [address] [place] [address] [place] [address] [phone]               Signed: Patricia Nettle 03/24/2020, 8:24 AM

## 2020-03-24 NOTE — Discharge Instructions (Signed)
Wound Care Keep incision covered and dry for two days.    Do not put any creams, lotions, or ointments on incision. Leave steri-strips on back.  They will fall off by themselves. Activity Walk each and every day, increasing distance each day. No lifting greater than 5 lbs.  No driving for 2 weeks; may ride as a passenger locally.  Diet Resume your normal diet.  Return to Work Will be discussed at your follow up appointment. Call Your Doctor If Any of These Occur Redness, drainage, or swelling at the wound.  Temperature greater than 101 degrees. Severe pain not relieved by pain medication. Incision starts to come apart. Follow Up Appt Call today for appointment in 1-2 weeks ([phone]) or for problems.  If you have any hardware placed in your spine, you will need an x-ray before your appointment.  

## 2020-03-24 NOTE — Evaluation (Signed)
Occupational Therapy Evaluation and Discharge Patient Details Name: Micheal [name] MRN: [id number] DOB: July 28, 1946 Today's Date: 03/24/2020    History of Present Illness Patient is a 74 y/o male with PMH HTN, hyperliipidemia, OSA, OS s/p TKA x 2, prostate CA, GERD, benign essential tremor admitted for L2-3 spinal stenosis now s/p decompression and fusion.   Clinical Impression   Pt was independent and active prior to admission. Presents with post operative pain. Educated in back precautions related to ADL and IADL and activities to avoid. Pt verbalized and/or demonstrated understanding. No further OT needs.    Follow Up Recommendations  No OT follow up    Equipment Recommendations  None recommended by OT    Recommendations for Other Services       Precautions / Restrictions Precautions Precautions: Back Precaution Booklet Issued: Yes (comment) Precaution Comments: reviewed precautions with pt related to ADL and IADL Required Braces or Orthoses: Spinal Brace Spinal Brace: Lumbar corset;Applied in sitting position      Mobility Bed Mobility Overal bed mobility: Modified Independent                Transfers Overall transfer level: Modified independent Equipment used: Straight cane                  Balance Overall balance assessment: Mild deficits observed, not formally tested                                         ADL either performed or assessed with clinical judgement   ADL Overall ADL's : Modified independent                                       General ADL Comments: Educated pt in compensatory strategies for ADL and IADL to avoid.     Vision Baseline Vision/History: Wears glasses Wears Glasses: At all times Patient Visual Report: No change from baseline       Perception     Praxis      Pertinent Vitals/Pain Pain Assessment: Faces Faces Pain Scale: Hurts a little bit Pain Location: surgical site Pain  Descriptors / Indicators: Operative site guarding Pain Intervention(s): Monitored during session     Hand Dominance Right   Extremity/Trunk Assessment Upper Extremity Assessment Upper Extremity Assessment: Overall WFL for tasks assessed   Lower Extremity Assessment Lower Extremity Assessment: Defer to PT evaluation       Communication Communication Communication: No difficulties   Cognition Arousal/Alertness: Awake/alert Behavior During Therapy: WFL for tasks assessed/performed Overall Cognitive Status: Within Functional Limits for tasks assessed                                     General Comments       Exercises     Shoulder Instructions      Home Living Family/patient expects to be discharged to:: Private residence Living Arrangements: Spouse/significant other Available Help at Discharge: Family Type of Home: House Home Access: Stairs to enter Technical brewer of Steps: 3 Entrance Stairs-Rails: None Home Layout: Multi-level;Able to live on main level with bedroom/bathroom     Bathroom Shower/Tub: Tub/shower unit   Bathroom Toilet: Handicapped height     Home Equipment: Latimer - single point  Prior Functioning/Environment Level of Independence: Independent        Comments: likes to work in the yard        OT Problem List:        OT Treatment/Interventions:      OT Goals(Current goals can be found in the care plan section) Acute Rehab OT Goals Patient Stated Goal: return home  OT Frequency:     Barriers to D/C:            Co-evaluation              AM-PAC OT "6 Clicks" Daily Activity     Outcome Measure Help from another person eating meals?: None Help from another person taking care of personal grooming?: None Help from another person toileting, which includes using toliet, bedpan, or urinal?: None Help from another person bathing (including washing, rinsing, drying)?: None Help from another person  to put on and taking off regular upper body clothing?: None Help from another person to put on and taking off regular lower body clothing?: None 6 Click Score: 24   End of Session    Activity Tolerance: Patient tolerated treatment well Patient left: in bed;with call bell/phone within reach (MD in room)  OT Visit Diagnosis: Pain                Time: 4403-4742 OT Time Calculation (min): 14 min Charges:  OT General Charges $OT Visit: 1 Visit OT Evaluation $OT Eval Low Complexity: 1 Low  Nestor Lewandowsky, OTR/L Acute Rehabilitation Services Pager: [phone] Office: [phone]  Malka So 03/24/2020, 8:45 AM

## 2020-03-24 NOTE — Care Management CC44 (Signed)
Condition Code 44 Documentation Completed  Patient Details  Name: Micheal [name] MRN: [id number] Date of Birth: 07/11/1946   Condition Code 44 given:  Yes Patient signature on Condition Code 44 notice:  Yes Documentation of 2 MD's agreement:  Yes Code 44 added to claim:  Yes    Benard Halsted, LCSW 03/24/2020, 10:50 AM

## 2020-03-24 NOTE — Progress Notes (Signed)
Patient is discharged from room 3C06 at this time. Alert and in stable condition. IV site d/c'd and instructions read to patient and spouse with understanding verbalized and all questions answered. Left unit via wheelchair with all belongings at side 

## 2020-03-24 NOTE — Progress Notes (Signed)
Subjective: The patient is alert and pleasant.  He looks well.  He wants to go home.  Objective: Vital signs in last 24 hours: Temp:  [98.3 F (36.8 C)-98.8 F (37.1 C)] 98.6 F (37 C) (07/08 0809) Pulse Rate:  [63-80] 63 (07/08 0809) Resp:  [14-20] 18 (07/08 0809) BP: (111-147)/(63-78) 114/63 (07/08 0809) SpO2:  [92 %-98 %] 97 % (07/08 0809) Estimated body mass index is 28.18 kg/m as calculated from the following:   Height as of 03/16/20: 5\' 10"  (1.778 m).   Weight as of 03/16/20: 89.1 kg.   Intake/Output from previous day: 07/07 0701 - 07/08 0700 In: 2530 [P.O.:480; I.V.:1700; IV Piggyback:300] Out: 410 [Urine:210; Blood:200] Intake/Output this shift: No intake/output data recorded.  Physical exam the patient is alert and oriented.  He looks well.  He is moving his lower extremities well.  Lab Results: Recent Labs    03/24/20 0655  WBC 19.1*  HGB 13.4  HCT 38.9*  PLT 216   BMET Recent Labs    03/24/20 0655  NA 137  K 4.2  CL 102  CO2 26  GLUCOSE 143*  BUN 17  CREATININE 0.91  CALCIUM 8.9    Studies/Results: DG Lumbar Spine 2-3 Views  Result Date: 03/23/2020 CLINICAL DATA:  L2-3 posterior fusion EXAM: DG C-ARM 1-60 MIN; LUMBAR SPINE - 2-3 VIEW COMPARISON:  03/23/2020 FINDINGS: Spot fluoroscopic intraoperative views demonstrate interbody disc spacer and bipedicular screws positioned in the lumbar spine at L2-3 when compared to the portable cross-table lateral view from earlier today. Anatomic alignment. IMPRESSION: Intraoperative views during L2-3 posterior fusion. Electronically Signed   By: Jerilynn Mages.  Shick M.D.   On: 03/23/2020 13:42   DG Spine Portable 1 View  Result Date: 03/23/2020 CLINICAL DATA:  Lumbar surgery, intraoperative examination EXAM: PORTABLE SPINE - 1 VIEW COMPARISON:  02/10/2020 FINDINGS: The lumbar spine is visualized from T12-L1 through the sacrum. There is straightening of the lumbar spine. Intervertebral disc space narrowing and endplate  remodeling is noted at L3-4 and L5-S1, similar to that noted on prior examination. Metallic probe is seen overlying the posterior aspect of the L2-3 facet. Soft tissue retractors seen within the posterior soft tissues at this level. Facet arthrosis results in neural foraminal narrowing at L3-4, not well characterized on this exam. IMPRESSION: Metallic probe overlying the posterior aspect of the L2-3 facet. Electronically Signed   By: Fidela Salisbury MD   On: 03/23/2020 09:41   DG C-Arm 1-60 Min  Result Date: 03/23/2020 CLINICAL DATA:  L2-3 posterior fusion EXAM: DG C-ARM 1-60 MIN; LUMBAR SPINE - 2-3 VIEW COMPARISON:  03/23/2020 FINDINGS: Spot fluoroscopic intraoperative views demonstrate interbody disc spacer and bipedicular screws positioned in the lumbar spine at L2-3 when compared to the portable cross-table lateral view from earlier today. Anatomic alignment. IMPRESSION: Intraoperative views during L2-3 posterior fusion. Electronically Signed   By: Jerilynn Mages.  Shick M.D.   On: 03/23/2020 13:42    Assessment/Plan: Postop day #1: The patient is doing well.  I have given him his discharge instructions and answered all his questions.  LOS: 1 day     Ophelia Charter 03/24/2020, 8:13 AM

## 2020-03-24 NOTE — Progress Notes (Signed)
Patient's spouse now not in agreement with verbal signature of Medicare Code 44 notice. She requests to speak with someone in [place]. Luz Lex notified and will contact patient and spouse to discuss.   Allina Riches LCSW

## 2020-03-24 NOTE — Care Management Obs Status (Signed)
[place] NOTIFICATION   Patient Details  Name: Micheal [name] MRN: [id number] Date of Birth: [birth year]   Medicare Observation Status Notification Given:  Yes    Benard Halsted, LCSW 03/24/2020, 10:50 AM

## 2020-03-28 MED FILL — Sodium Chloride IV Soln 0.9%: INTRAVENOUS | Qty: 1000 | Status: AC

## 2020-03-28 MED FILL — Heparin Sodium (Porcine) Inj 1000 Unit/ML: INTRAMUSCULAR | Qty: 30 | Status: AC

## 2020-04-01 ENCOUNTER — Other Ambulatory Visit: Payer: Self-pay | Admitting: Family Medicine

## 2020-04-12 DIAGNOSIS — M48062 Spinal stenosis, lumbar region with neurogenic claudication: Secondary | ICD-10-CM | POA: Diagnosis not present

## 2020-04-12 DIAGNOSIS — M4316 Spondylolisthesis, lumbar region: Secondary | ICD-10-CM | POA: Diagnosis not present

## 2020-05-21 DIAGNOSIS — Z23 Encounter for immunization: Secondary | ICD-10-CM | POA: Diagnosis not present

## 2020-05-27 DIAGNOSIS — K4020 Bilateral inguinal hernia, without obstruction or gangrene, not specified as recurrent: Secondary | ICD-10-CM | POA: Diagnosis not present

## 2020-06-11 DIAGNOSIS — Z23 Encounter for immunization: Secondary | ICD-10-CM | POA: Diagnosis not present

## 2020-07-12 DIAGNOSIS — M5126 Other intervertebral disc displacement, lumbar region: Secondary | ICD-10-CM | POA: Diagnosis not present

## 2020-07-12 DIAGNOSIS — M4712 Other spondylosis with myelopathy, cervical region: Secondary | ICD-10-CM | POA: Diagnosis not present

## 2020-07-12 DIAGNOSIS — M4316 Spondylolisthesis, lumbar region: Secondary | ICD-10-CM | POA: Diagnosis not present

## 2020-09-06 ENCOUNTER — Encounter: Payer: Self-pay | Admitting: Dermatology

## 2020-09-06 ENCOUNTER — Other Ambulatory Visit: Payer: Self-pay

## 2020-09-06 ENCOUNTER — Ambulatory Visit (INDEPENDENT_AMBULATORY_CARE_PROVIDER_SITE_OTHER): Payer: Medicare Other | Admitting: Dermatology

## 2020-09-06 DIAGNOSIS — Z1283 Encounter for screening for malignant neoplasm of skin: Secondary | ICD-10-CM

## 2020-09-06 DIAGNOSIS — Z85828 Personal history of other malignant neoplasm of skin: Secondary | ICD-10-CM | POA: Diagnosis not present

## 2020-09-06 DIAGNOSIS — L570 Actinic keratosis: Secondary | ICD-10-CM | POA: Diagnosis not present

## 2020-09-06 DIAGNOSIS — L111 Transient acantholytic dermatosis [Grover]: Secondary | ICD-10-CM | POA: Diagnosis not present

## 2020-09-07 ENCOUNTER — Encounter: Payer: Self-pay | Admitting: Dermatology

## 2020-09-07 NOTE — Progress Notes (Signed)
   Follow-Up Visit   Subjective  Micheal [name] is a 74 y.o. male who presents for the following: Skin Problem (Few rough spots on scalp).  New crust Location: Scalp Duration:  Quality:  Associated Signs/Symptoms: Modifying Factors:  Severity:  Timing: Context: History of skin cancers  Objective  Well appearing patient in no apparent distress; mood and affect are within normal limits. Objective  Chest - [facility]): Waist up skin examination, no new or recurrent nonmelanoma skin cancers, no atypical moles.  Objective  Chest - [facility]): Chest: Several dozen 3 mm slightly inflamed smooth pink papules compatible with Grovers.  Objective  Scalp: Ak on scalp 7 mm flat pink crust: Defer until after back surgery in early January.   All skin waist up examined.   Assessment & Plan    Screening exam for skin cancer Chest - Medial [facility])  Annual skin examination, encouraged to self examine twice annually.  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  AK (actinic keratosis) Scalp  Defer treatment until recovery from upcoming spinal surgery.     I, Micheal Monarch, MD, have reviewed all documentation for this visit.  The documentation on 09/07/20 for the exam, diagnosis, procedures, and orders are all accurate and complete.

## 2020-09-16 ENCOUNTER — Other Ambulatory Visit: Payer: Self-pay | Admitting: Family Medicine

## 2020-09-21 DIAGNOSIS — M4712 Other spondylosis with myelopathy, cervical region: Secondary | ICD-10-CM | POA: Diagnosis not present

## 2020-09-21 DIAGNOSIS — M4722 Other spondylosis with radiculopathy, cervical region: Secondary | ICD-10-CM | POA: Diagnosis not present

## 2020-10-11 DIAGNOSIS — M4712 Other spondylosis with myelopathy, cervical region: Secondary | ICD-10-CM | POA: Diagnosis not present

## 2020-10-27 ENCOUNTER — Ambulatory Visit (INDEPENDENT_AMBULATORY_CARE_PROVIDER_SITE_OTHER): Payer: Medicare Other

## 2020-10-27 DIAGNOSIS — Z0000 Encounter for general adult medical examination without abnormal findings: Secondary | ICD-10-CM

## 2020-10-27 NOTE — Progress Notes (Signed)
Subjective:   Micheal [name] is a 75 y.o. male who presents for Medicare Annual/Subsequent preventive examination.  Virtual Visit via Video Note  I connected with Micheal [name] by a video enabled telemedicine application and verified that I am speaking with the correct person using two identifiers.  Location: Patient: Home Provider: Office Persons participating in the virtual visit: patient, provider   I discussed the limitations of evaluation and management by telemedicine and the availability of in person appointments. The patient expressed understanding and agreed to proceed.     Micheal Beach Ingra Rother,LPN    Review of Systems    N/A  Cardiac Risk Factors include: advanced age (>9mn, >>50women);male gender;hypertension;dyslipidemia     Objective:    Today's Vitals   There is no height or weight on file to calculate BMI.  Advanced Directives 10/27/2020 03/24/2020 03/16/2020 08/25/2019 07/29/2018 12/18/2016 04/02/2016  Does Patient Have a Medical Advance Directive? Yes No;Yes No;_0   Type of AParamedicof AEllinwoodLiving will HMoshannonLiving will HBoltLiving will HBlandonLiving will - HGreeleyLiving will HMount ArlingtonLiving will  Does patient want to make changes to medical advance directive? No - Patient declined No - Patient declined - No - Patient declined - - -  Copy of HAlleghenyvillein Chart? No - copy requested No - copy requested No - copy requested No - copy requested - - No - copy requested    Current Medications (verified) Outpatient Encounter Medications as of 10/27/2020  Medication Sig  . acetaminophen (TYLENOL) 500 MG tablet Take 1,000 mg by mouth every 6 (six) hours as needed for mild pain.   .Marland KitchenamLODipine (NORVASC) 5 MG tablet TAKE 1 TABLET BY MOUTH  DAILY  . atorvastatin (LIPITOR) 20 MG tablet TAKE 1 TABLET BY  MOUTH  DAILY AT 6 PM  . gabapentin (NEURONTIN) 600 MG tablet Take 600 mg by mouth at bedtime and may repeat dose one time if needed.  .Marland Kitchenolmesartan (BENICAR) 20 MG tablet Take 1 tablet (20 mg total) by mouth daily.  .Marland KitchenoxyCODONE (OXY IR/ROXICODONE) 5 MG immediate release tablet Take 1 tablet (5 mg total) by mouth every 4 (four) hours as needed for moderate pain ((score 4 to 6)).  . Probiotic Product (PROBIOTIC PO) Take 1 capsule by mouth daily.  . sertraline (ZOLOFT) 50 MG tablet TAKE 1 TABLET BY MOUTH  DAILY  . Zoster Vaccine Adjuvanted ([facility] injection Shingrix (PF) 50 mcg/0.5 mL intramuscular suspension, kit (Patient not taking: Reported on 10/27/2020)  . [DISCONTINUED] cyclobenzaprine (FLEXERIL) 10 MG tablet Take 1 tablet (10 mg total) by mouth 3 (three) times daily as needed for muscle spasms.  . [DISCONTINUED] docusate sodium (COLACE) 100 MG capsule Take 1 capsule (100 mg total) by mouth 2 (two) times daily.   No facility-administered encounter medications on file as of 10/27/2020.    Allergies (verified) Patient has no known allergies.   History: Past Medical History:  Diagnosis Date  . Anxiety   . Basal cell carcinoma 04/23/2014   left lower leg bcc tx=cx3 584f . BCC (basal cell carcinoma of skin) 02/26/2018   right mid nose bridge tx=mohs 04/17/18  . Benign essential tremor    hands  [facility]; skin cancers  . Complication of anesthesia 1999   spinal with last knee replacement  . Diverticulitis    last Saturday 03/24/16 -tx with  antibiotics  . GERD (gastroesophageal reflux disease)   . H/O balanitis    recurrent intermittant-- uses diprolene cream prn  . History of colon polyps   . History of kidney stones   . History of prostate cancer urologist-  dr Alinda Money-  currently PSA nondetectable 10/ 2016   s/p  radial prostatectomy w/ nerve sparing 07-17-2007/   stage T1c,  Gleason 3+3=6,  PSA 6.33  . Hyperlipidemia   . Hypertension   . Mild obstructive  sleep apnea    uses mouth guard only  . Nocturia   . Osteoarthritis    knees  . SCC (squamous cell carcinoma) 06/03/2013   right hand tx=cx3 15f  . Sigmoid diverticulosis   . Squamous cell carcinoma of skin 06/30/2007   right ear =clear  . Synovial hypertrophy of left knee   . Urge urinary incontinence    Past Surgical History:  Procedure Laterality Date  . BACK SURGERY     L4 surgery  . CARDIOVASCULAR STRESS TEST  08-04-2014   normal perfusion nuclear study/  normal LV function and wall motion , ef 54%  . COLONOSCOPY W/ POLYPECTOMY  06-21-2014  . DIAGNOSTIC LAPAROSCOPY    . FRACTURE SURGERY  1975   right thumb  . HERNIA REPAIR    . JOINT REPLACEMENT    . KNEE ARTHROSCOPY Bilateral right 07-31-2009//  left   . KNEE ARTHROSCOPY Left 12/08/2014   Procedure: ARTHROSCOPY LEFT KNEE WITH SYNOVECTOMY;  Surgeon: FGaynelle Arabian MD;  [facility]  Service: Orthopedics;  Laterality: Left;  . KNEE ARTHROSCOPY Left 02/14/2016   Procedure: ARTHROSCOPY KNEE WTH SYNOVECTOMY;  Surgeon: FGaynelle Arabian MD;  [facility]  Service: Orthopedics;  Laterality: Left;  . LUMBAR DISC SURGERY  04-13-2002   left  L4 -- L5  . ROBOT ASSISTED LAPAROSCOPIC RADICAL PROSTATECTOMY  07-17-2007  . TONSILLECTOMY  as child  . TOTAL KNEE ARTHROPLASTY Left 03-08-2008  . TOTAL KNEE ARTHROPLASTY Right 04/02/2016   Procedure: RIGHT TOTAL KNEE ARTHROPLASTY;  Surgeon: FGaynelle Arabian MD;  Location: [facility];  Service: Orthopedics;  Laterality: Right;   Family History  Problem Relation Age of Onset  . Sudden death Mother   . Aneurysm Father   . Heart attack Other        grandfather   Social History   Socioeconomic History  . Marital status: Married    Spouse name: Not on file  . Number of children: 2  . Years of education: doctoral degree  . Highest education level: Doctorate  Occupational History  . 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 Smoking status: Never Smoker  . Smokeless tobacco: Never Used  Vaping Use  . Vaping Use: Never used  Substance and Sexual Activity  . Alcohol use: Yes    Comment: social 1-2 weekly  . Drug use: No  . Sexual activity: Not on file  Other Topics Concern  . Not on file  Social History Narrative  . Not on file   Social Determinants of Health   Financial Resource Strain: Low Risk   . Difficulty of Paying Living Expenses: Not hard at all  Food Insecurity: Not on file  Transportation Needs: No Transportation Needs  . Lack of Transportation (Medical): No  . Lack of Transportation (Non-Medical): No  Physical Activity: Sufficiently Active  . Days of Exercise per Week: 7 days  . Minutes of Exercise per Session: 60 min  Stress: No Stress Concern Present  . Feeling of Stress : Not at all  Social Connections: Moderately Integrated  . Frequency of Communication with Friends and Family: More than three times a week  . Frequency of Social Gatherings with Friends and Family: Twice a week  . Attends Religious Services: More than 4 times per year  . Active Member of Clubs or Organizations: No  . Attends Archivist Meetings: Never  . Marital Status: Married    Tobacco Counseling Counseling given: Not Answered   Clinical Intake:  Pre-visit preparation completed: Yes  Pain : No/denies pain     Nutritional Risks: None Diabetes: No  How often do you need to have someone help you when you read instructions, pamphlets, or other written materials from your doctor or pharmacy?: 1 - Never  Diabetic?No   Interpreter Needed?: No  Information entered by :: [place] of Daily Living In your present state of health, do you have any difficulty performing the following activities: 10/27/2020 03/24/2020  Hearing? N N  Vision? N N  Difficulty concentrating or making decisions? N N  Walking or climbing stairs? N Y  Comment - -  Dressing  or bathing? N Y  Doing errands, shopping? N N  Preparing Food and eating ? N -  Using the Toilet? N -  In the past six months, have you accidently leaked urine? Y -  Comment has some bladder leakage -  Do you have problems with loss of bowel control? N -  Managing your Medications? N -  Managing your Finances? N -  Housekeeping or managing your Housekeeping? N -  Some recent data might be hidden    Patient Care Team: [name], MD as PCP - General Leeroy Cha, MD as Attending Physician (Neurosurgery) [name], MD as Consulting Physician (Dermatology)  Indicate any recent Medical Services you may have received from other than [facility] providers in the past year (date may be approximate).     Assessment:   This is a routine wellness examination for Micheal [name].  Hearing/Vision screen  Hearing Screening   _0  _1  _2  _3  _4  _5  _6  _7  _8   Right ear:           Left ear:           Vision Screening Comments: Gets eye exams once per year   Dietary issues and exercise activities discussed: Current Exercise Habits: Home exercise routine, Type of exercise: walking, Time (Minutes): 60, Frequency (Times/Week): 7, Weekly Exercise (Minutes/Week): 420, Intensity: Moderate  Goals    . patient     Continue fishing and reducing stress     . Patient Stated     To maintain health  Catch the big one!    . Patient Stated     I will continue to walk 2 miles per day      Depression Screen PHQ 2/9 Scores 10/27/2020 08/25/2019 07/29/2018 07/24/2017 12/19/2016 12/18/2016 10/31/2015  PHQ - 2 Score 0 0 0 0 0 0 0    Fall Risk Fall Risk  10/27/2020 08/25/2019 08/07/2019 07/29/2018 04/03/2018  Falls in the past year? 0 _9 Yes  Comment - - Emmi Telephone Survey: data to providers prior to load working in garden; leaned on something unstable;  stubbed tub on edge of bed  -  Number falls in past yr: 0 0 _10 Comment - tripped over a log in the back yard; no injury  [employer]  Actual Response = 2 - -  Injury with Fall? 0 - 1 - No  Risk for fall due to : No Fall Risks History of fall(s) - - Other (Comment)  Follow up Falls evaluation completed;Falls prevention discussed Falls evaluation completed;Education provided;Falls prevention discussed - - Falls evaluation completed;Education provided;Falls prevention discussed    FALL RISK PREVENTION PERTAINING TO THE HOME:  Any stairs in or around the home? Yes  If so, are there any without handrails? Yes  Home free of loose throw rugs in walkways, pet beds, electrical cords, etc? Yes  Adequate lighting in your home to reduce risk of falls? Yes   ASSISTIVE DEVICES UTILIZED TO PREVENT FALLS:  Life alert? No  Use of a cane, walker or w/c? No  Grab bars in the bathroom? No  Shower chair or bench in shower? No  Elevated toilet seat or a handicapped toilet? No   Cognitive Function: MMSE - Mini Mental State Exam 07/29/2018 07/29/2018  Not completed: (No Data) (No Data)        Immunizations Immunization History  Administered Date(s) Administered  . Influenza, High Dose Seasonal PF 06/22/2014, 05/17/2019, 05/17/2019  . Influenza-Unspecified 07/19/2015, 06/20/2016, 06/10/2017, 06/09/2018  . PFIZER(Purple Top)SARS-COV-2 Vaccination 10/06/2019, 10/27/2019  . Pneumococcal Conjugate-13 10/26/2013  . Pneumococcal Polysaccharide-23 02/16/2007, 10/31/2015  . Td 10/18/2004  . Tdap 12/25/2013  . Zoster 02/16/2007  . Zoster Recombinat (Shingrix) 05/08/2018, 07/20/2018    TDAP status: Up to date  Flu Vaccine status: Up to date  Pneumococcal vaccine status: Up to date  Covid-19 vaccine status: Completed vaccines  Qualifies for Shingles Vaccine? Yes   Zostavax completed Yes   Shingrix Completed?: Yes  Screening Tests Health Maintenance  Topic Date Due  . Hepatitis C Screening  Never done  . COVID-19 Vaccine (3 - Pfizer risk 4-dose series) 11/24/2019  . TETANUS/TDAP  12/26/2023  .  COLONOSCOPY (Pts 45-65yr Insurance coverage will need to be confirmed)  06/21/2024  . INFLUENZA VACCINE  Completed  . PNA vac Low Risk Adult  Completed    Health Maintenance  Health Maintenance Due  Topic Date Due  . Hepatitis C Screening  Never done  . COVID-19 Vaccine (3 - Pfizer risk 4-dose series) 11/24/2019    Colorectal cancer screening: Type of screening: Colonoscopy. Completed 06/21/2014. Repeat every 10 years  Lung Cancer Screening: (Low Dose CT Chest recommended if Age 74-80years, 30 pack-year currently smoking OR have quit w/in 15years.) does not qualify.   Lung Cancer Screening Referral: N/A   Additional Screening:  Hepatitis C Screening: does qualify;  Vision Screening: Recommended annual ophthalmology exams for early detection of glaucoma and other disorders of the eye. Is the patient up to date with their annual eye exam?  Yes  Who is the provider or what is the name of the office in which the patient attends annual eye exams? Dr. MEllie Lunch If pt is not established with a provider, would they like to be referred to a provider to establish care? No .   Dental Screening: Recommended annual dental exams for proper oral hygiene  Community Resource Referral / Chronic Care Management: CRR required this visit?  No   CCM required this visit?  No      Plan:     I have personally reviewed and noted the following in the patient's chart:   . Medical and social history . Use of alcohol, tobacco or illicit drugs  . Current medications and supplements . Functional ability and status . Nutritional status .  Physical activity . Advanced directives . List of other physicians . Hospitalizations, surgeries, and ER visits in previous 12 months . Vitals . Screenings to include cognitive, depression, and falls . Referrals and appointments  In addition, I have reviewed and discussed with patient certain preventive protocols, quality metrics, and best practice  recommendations. A written personalized care plan for preventive services as well as general preventive health recommendations were provided to patient.     Ofilia Neas, LPN   04/24/8109   Nurse Notes: None

## 2020-10-27 NOTE — Patient Instructions (Signed)
Micheal [name] , Thank you for taking time to come for your Medicare Wellness Visit. I appreciate your ongoing commitment to your health goals. Please review the following plan we discussed and let me know if I can assist you in the future.   Screening recommendations/referrals: Colonoscopy: Up to date, next due 06/21/2024 Recommended yearly ophthalmology/optometry visit for glaucoma screening and checkup Recommended yearly dental visit for hygiene and checkup  Vaccinations: Influenza vaccine: Up to date, next due fall 2022  Pneumococcal vaccine: Completed series  Tdap vaccine: Up to date, next due 12/26/2023 Shingles vaccine: Completed series     Advanced directives: Please bring copies of your advanced medical directives into our office so that we may scan them into your chart.  Conditions/risks identified: None   Next appointment: None   Preventive Care 65 Years and Older, Male Preventive care refers to lifestyle choices and visits with your health care provider that can promote health and wellness. What does preventive care include?  A yearly physical exam. This is also called an annual well check.  Dental exams once or twice a year.  Routine eye exams. Ask your health care provider how often you should have your eyes checked.  Personal lifestyle choices, including:  Daily care of your teeth and gums.  Regular physical activity.  Eating a healthy diet.  Avoiding tobacco and drug use.  Limiting alcohol use.  Practicing safe sex.  Taking low doses of aspirin every day.  Taking vitamin and mineral supplements as recommended by your health care provider. What happens during an annual well check? The services and screenings done by your health care provider during your annual well check will depend on your age, overall health, lifestyle risk factors, and family history of disease. Counseling  Your health care provider may ask you questions about your:  Alcohol  use.  Tobacco use.  Drug use.  Emotional well-being.  Home and relationship well-being.  Sexual activity.  Eating habits.  History of falls.  Memory and ability to understand (cognition).  Work and work Statistician. Screening  You may have the following tests or measurements:  Height, weight, and BMI.  Blood pressure.  Lipid and cholesterol levels. These may be checked every 5 years, or more frequently if you are over 25 years old.  Skin check.  Lung cancer screening. You may have this screening every year starting at age 37 if you have a 30-pack-year history of smoking and currently smoke or have quit within the past 15 years.  Fecal occult blood test (FOBT) of the stool. You may have this test every year starting at age 77.  Flexible sigmoidoscopy or colonoscopy. You may have a sigmoidoscopy every 5 years or a colonoscopy every 10 years starting at age 70.  Prostate cancer screening. Recommendations will vary depending on your family history and other risks.  Hepatitis C blood test.  Hepatitis B blood test.  Sexually transmitted disease (STD) testing.  Diabetes screening. This is done by checking your blood sugar (glucose) after you have not eaten for a while (fasting). You may have this done every 1-3 years.  Abdominal aortic aneurysm (AAA) screening. You may need this if you are a current or former smoker.  Osteoporosis. You may be screened starting at age 85 if you are at high risk. Talk with your health care provider about your test results, treatment options, and if necessary, the need for more tests. Vaccines  Your health care provider may recommend certain vaccines, such as:  Influenza  vaccine. This is recommended every year.  Tetanus, diphtheria, and acellular pertussis (Tdap, Td) vaccine. You may need a Td booster every 10 years.  Zoster vaccine. You may need this after age 64.  Pneumococcal 13-valent conjugate (PCV13) vaccine. One dose is  recommended after age 80.  Pneumococcal polysaccharide (PPSV23) vaccine. One dose is recommended after age 63. Talk to your health care provider about which screenings and vaccines you need and how often you need them. This information is not intended to replace advice given to you by your health care provider. Make sure you discuss any questions you have with your health care provider. Document Released: 09/30/2015 Document Revised: 05/23/2016 Document Reviewed: 07/05/2015 Elsevier Interactive Patient Education  2017 [place] Prevention in the Home Falls can cause injuries. They can happen to people of all ages. There are many things you can do to make your home safe and to help prevent falls. What can I do on the outside of my home?  Regularly fix the edges of walkways and driveways and fix any cracks.  Remove anything that might make you trip as you walk through a door, such as a raised step or threshold.  Trim any bushes or trees on the path to your home.  Use bright outdoor lighting.  Clear any walking paths of anything that might make someone trip, such as rocks or tools.  Regularly check to see if handrails are loose or broken. Make sure that both sides of any steps have handrails.  Any raised decks and porches should have guardrails on the edges.  Have any leaves, snow, or ice cleared regularly.  Use sand or salt on walking paths during winter.  Clean up any spills in your garage right away. This includes oil or grease spills. What can I do in the bathroom?  Use night lights.  Install grab bars by the toilet and in the tub and shower. Do not use towel bars as grab bars.  Use non-skid mats or decals in the tub or shower.  If you need to sit down in the shower, use a plastic, non-slip stool.  Keep the floor dry. Clean up any water that spills on the floor as soon as it happens.  Remove soap buildup in the tub or shower regularly.  Attach bath mats  securely with double-sided non-slip rug tape.  Do not have throw rugs and other things on the floor that can make you trip. What can I do in the bedroom?  Use night lights.  Make sure that you have a light by your bed that is easy to reach.  Do not use any sheets or blankets that are too big for your bed. They should not hang down onto the floor.  Have a firm chair that has side arms. You can use this for support while you get dressed.  Do not have throw rugs and other things on the floor that can make you trip. What can I do in the kitchen?  Clean up any spills right away.  Avoid walking on wet floors.  Keep items that you use a lot in easy-to-reach places.  If you need to reach something above you, use a strong step stool that has a grab bar.  Keep electrical cords out of the way.  Do not use floor polish or wax that makes floors slippery. If you must use wax, use non-skid floor wax.  Do not have throw rugs and other things on the floor that can  make you trip. What can I do with my stairs?  Do not leave any items on the stairs.  Make sure that there are handrails on both sides of the stairs and use them. Fix handrails that are broken or loose. Make sure that handrails are as long as the stairways.  Check any carpeting to make sure that it is firmly attached to the stairs. Fix any carpet that is loose or worn.  Avoid having throw rugs at the top or bottom of the stairs. If you do have throw rugs, attach them to the floor with carpet tape.  Make sure that you have a light switch at the top of the stairs and the bottom of the stairs. If you do not have them, ask someone to add them for you. What else can I do to help prevent falls?  Wear shoes that:  Do not have high heels.  Have rubber bottoms.  Are comfortable and fit you well.  Are closed at the toe. Do not wear sandals.  If you use a stepladder:  Make sure that it is fully opened. Do not climb a closed  stepladder.  Make sure that both sides of the stepladder are locked into place.  Ask someone to hold it for you, if possible.  Clearly mark and make sure that you can see:  Any grab bars or handrails.  First and last steps.  Where the edge of each step is.  Use tools that help you move around (mobility aids) if they are needed. These include:  Canes.  Walkers.  Scooters.  Crutches.  Turn on the lights when you go into a dark area. Replace any light bulbs as soon as they burn out.  Set up your furniture so you have a clear path. Avoid moving your furniture around.  If any of your floors are uneven, fix them.  If there are any pets around you, be aware of where they are.  Review your medicines with your doctor. Some medicines can make you feel dizzy. This can increase your chance of falling. Ask your doctor what other things that you can do to help prevent falls. This information is not intended to replace advice given to you by your health care provider. Make sure you discuss any questions you have with your health care provider. Document Released: 06/30/2009 Document Revised: 02/09/2016 Document Reviewed: 10/08/2014 Elsevier Interactive Patient Education  2017 [employer].

## 2020-11-02 ENCOUNTER — Other Ambulatory Visit: Payer: Self-pay

## 2020-11-02 ENCOUNTER — Ambulatory Visit (INDEPENDENT_AMBULATORY_CARE_PROVIDER_SITE_OTHER): Payer: Medicare Other | Admitting: Dermatology

## 2020-11-02 ENCOUNTER — Encounter: Payer: Self-pay | Admitting: Dermatology

## 2020-11-02 DIAGNOSIS — L570 Actinic keratosis: Secondary | ICD-10-CM

## 2020-11-02 DIAGNOSIS — Z85828 Personal history of other malignant neoplasm of skin: Secondary | ICD-10-CM

## 2020-11-02 DIAGNOSIS — L821 Other seborrheic keratosis: Secondary | ICD-10-CM | POA: Diagnosis not present

## 2020-11-06 ENCOUNTER — Encounter: Payer: Self-pay | Admitting: Dermatology

## 2020-11-06 NOTE — Progress Notes (Signed)
   Follow-Up Visit   Subjective  Micheal [name] is a 75 y.o. male who presents for the following: Skin Problem (Scaly spots on scalp and ears- deferred last office visit due to upcoming surgery).  Crusts Location: Scalp and ear Duration:  Quality:  Associated Signs/Symptoms: Modifying Factors:  Severity:  Timing: Context: Also would like other spots rechecked  Objective  Well appearing patient in no apparent distress; mood and affect are within normal limits. Objective  Right Breast: Multiple white scar- clear  Objective  Left Breast: Multiple white scar- clear  Objective  Chest - Medial [facility]): And textured flattopped papules--Discussed benign etiology and prognosis.   Objective  Left Temporal Scalp, Mid Frontal Scalp (3), Right Superior Helix: Pink 2 to 5 mm crusts    A focused examination was performed including Head, neck, arms.. Relevant physical exam findings are noted in the Assessment and Plan.   Assessment & Plan    History of basal cell cancer Right Breast  Yearly skin check  History of squamous cell carcinoma of skin Left Breast  Yearly skin check  Seborrheic keratosis Chest - Medial (Center)  Okay to leave if stable  AK (actinic keratosis) (5) Right Superior Helix; Mid Frontal Scalp (3); Left Temporal Scalp  Destruction of lesion - Left Temporal Scalp, Mid Frontal Scalp, Right Superior Helix Complexity: simple   Destruction method: cryotherapy   Informed consent: discussed and consent obtained   Timeout:  patient name, date of birth, surgical site, and procedure verified Lesion destroyed using liquid nitrogen: Yes   Cryotherapy cycles:  3 Outcome: patient tolerated procedure well with no complications        I, [name], MD, have reviewed all documentation for this visit.  The documentation on 11/06/20 for the exam, diagnosis, procedures, and orders are all accurate and complete.

## 2020-11-28 ENCOUNTER — Other Ambulatory Visit: Payer: Self-pay

## 2020-11-28 ENCOUNTER — Other Ambulatory Visit: Payer: Self-pay | Admitting: Family Medicine

## 2020-11-28 ENCOUNTER — Encounter: Payer: Self-pay | Admitting: Family Medicine

## 2020-11-28 ENCOUNTER — Ambulatory Visit (INDEPENDENT_AMBULATORY_CARE_PROVIDER_SITE_OTHER): Payer: Medicare Other | Admitting: Family Medicine

## 2020-11-28 VITALS — BP 110/78 | HR 68 | Temp 97.9°F | Ht 70.0 in | Wt 195.3 lb

## 2020-11-28 DIAGNOSIS — R600 Localized edema: Secondary | ICD-10-CM | POA: Diagnosis not present

## 2020-11-28 DIAGNOSIS — Z8546 Personal history of malignant neoplasm of prostate: Secondary | ICD-10-CM | POA: Diagnosis not present

## 2020-11-28 DIAGNOSIS — I10 Essential (primary) hypertension: Secondary | ICD-10-CM | POA: Diagnosis not present

## 2020-11-28 NOTE — Patient Instructions (Signed)
STOP the Amlodipine  Elevate legs frequently  Monitor blood pressure and be in touch if consistently > 140/90.

## 2020-11-28 NOTE — Progress Notes (Signed)
Established Patient Office Visit  Subjective:  Patient ID: Micheal [name], male    DOB: 02/25/1946  Age: 75 y.o. MRN: [id number]  CC:  Chief Complaint  Patient presents with  . Edema    Patient complains of swelling bilateral LE x week, denies any foot or leg pain    HPI Micheal [name] presents for bilateral lower extremity swelling for the past several days.  He states he is not noted this previously.  He does have hypertension currently treated with amlodipine 5 mg daily and olmesartan 20 mg daily.  No recent dietary changes.  Does not restrict sodium.  No recent nonsteroidal use.  Walks 2 miles per day about 5 days/week.  No intolerance with exercise.  No exertional dyspnea.  No orthopnea.  He states he has lost about 8 pounds over the past few weeks which is intentional due to dietary changes.  His home weight is about 188 pounds.  Only other medication include sertraline and telmisartan as well as atorvastatin.  Edema may be possibly slightly worse late in the day.  Remote history of prostate cancer and this was almost 14 years ago.  He is requesting repeat PSA.  Past Medical History:  Diagnosis Date  . Anxiety   . Basal cell carcinoma 04/23/2014   left lower leg bcc tx=cx3 19fu  . BCC (basal cell carcinoma of skin) 02/26/2018   right mid nose bridge tx=mohs 04/17/18  . Benign essential tremor    hands  . Cancer [facility])    prostate cancer; skin cancers  . Complication of anesthesia 1999   spinal with last knee replacement  . Diverticulitis    last Saturday 03/24/16 -tx with antibiotics  . GERD (gastroesophageal reflux disease)   . H/O balanitis    recurrent intermittant-- uses diprolene cream prn  . History of colon polyps   . History of kidney stones   . History of prostate cancer urologist-  dr Alinda Money-  currently PSA nondetectable 10/ 2016   s/p  radial prostatectomy w/ nerve sparing 07-17-2007/   stage T1c,  Gleason 3+3=6,  PSA 6.33  . Hyperlipidemia   .  Hypertension   . Mild obstructive sleep apnea    uses mouth guard only  . Nocturia   . Osteoarthritis    knees  . SCC (squamous cell carcinoma) 06/03/2013   right hand tx=cx3 9fu  . Sigmoid diverticulosis   . Squamous cell carcinoma of skin 06/30/2007   right ear =clear  . Synovial hypertrophy of left knee   . Urge urinary incontinence     Past Surgical History:  Procedure Laterality Date  . BACK SURGERY     L4 surgery  . CARDIOVASCULAR STRESS TEST  08-04-2014   normal perfusion nuclear study/  normal LV function and wall motion , ef 54%  . COLONOSCOPY W/ POLYPECTOMY  06-21-2014  . DIAGNOSTIC LAPAROSCOPY    . FRACTURE SURGERY  1975   right thumb  . HERNIA REPAIR    . JOINT REPLACEMENT    . KNEE ARTHROSCOPY Bilateral right 07-31-2009//  left   . KNEE ARTHROSCOPY Left 12/08/2014   Procedure: ARTHROSCOPY LEFT KNEE WITH SYNOVECTOMY;  Surgeon: Gaynelle Arabian, MD;  Location: [facility];  Service: Orthopedics;  Laterality: Left;  . KNEE ARTHROSCOPY Left 02/14/2016   Procedure: ARTHROSCOPY KNEE WTH SYNOVECTOMY;  Surgeon: Gaynelle Arabian, MD;  Location: [facility];  Service: Orthopedics;  Laterality: Left;  . LUMBAR DISC SURGERY  04-13-2002   left  L4 -- L5  . ROBOT ASSISTED LAPAROSCOPIC RADICAL PROSTATECTOMY  07-17-2007  . TONSILLECTOMY  as child  . TOTAL KNEE ARTHROPLASTY Left 03-08-2008  . TOTAL KNEE ARTHROPLASTY Right 04/02/2016   Procedure: RIGHT TOTAL KNEE ARTHROPLASTY;  Surgeon: Gaynelle Arabian, MD;  Location: [facility];  Service: Orthopedics;  Laterality: Right;    Family History  Problem Relation Age of Onset  . Sudden death Mother   . Aneurysm Father   . Heart attack Other        grandfather    Social History   Socioeconomic History  . Marital status: Married    Spouse name: Not on file  . Number of children: 2  . Years of education: doctoral degree  . Highest education level: Doctorate  Occupational History  . 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 Smoking status: Never Smoker  . Smokeless tobacco: Never Used  Vaping Use  . Vaping Use: Never used  Substance and Sexual Activity  . Alcohol use: Yes    Comment: social 1-2 weekly  . Drug use: No  . Sexual activity: Not on file  Other Topics Concern  . Not on file  Social History Narrative  . Not on file   Social Determinants of Health   Financial Resource Strain: Low Risk   . Difficulty of Paying Living Expenses: Not hard at all  Food Insecurity: Not on file  Transportation Needs: No Transportation Needs  . Lack of Transportation (Medical): No  . Lack of Transportation (Non-Medical): No  Physical Activity: Sufficiently Active  . Days of Exercise per Week: 7 days  . Minutes of Exercise per Session: 60 min  Stress: No Stress Concern Present  . Feeling of Stress : Not at all  Social Connections: Moderately Integrated  . Frequency of Communication with Friends and Family: More than three times a week  . Frequency of Social Gatherings with Friends and Family: Twice a week  . Attends Religious Services: More than 4 times per year  . Active Member of Clubs or Organizations: No  . Attends Archivist Meetings: Never  . Marital Status: Married  Human resources officer Violence: Not At Risk  . Fear of Current or Ex-Partner: No  . Emotionally Abused: No  . Physically Abused: No  . Sexually Abused: No    Outpatient Medications Prior to Visit  Medication Sig Dispense Refill  . acetaminophen (TYLENOL) 500 MG tablet Take 1,000 mg by mouth every 6 (six) hours as needed for mild pain.     Marland Kitchen atorvastatin (LIPITOR) 20 MG tablet TAKE 1 TABLET BY MOUTH  DAILY AT 6 PM 90 tablet 2  . olmesartan (BENICAR) 20 MG tablet Take 1 tablet (20 mg total) by mouth daily. 90 tablet 3  . oxyCODONE (OXY IR/ROXICODONE) 5 MG immediate release tablet Take 1 tablet (5 mg total) by mouth every 4 (four) hours as needed for moderate pain  ((score 4 to 6)). 30 tablet 0  . Probiotic Product (PROBIOTIC PO) Take 1 capsule by mouth daily.    . sertraline (ZOLOFT) 50 MG tablet TAKE 1 TABLET BY MOUTH  DAILY 90 tablet 2  . Zoster Vaccine Adjuvanted [facility]) injection     . amLODipine (NORVASC) 5 MG tablet TAKE 1 TABLET BY MOUTH  DAILY 90 tablet 3  . gabapentin (NEURONTIN) 600 MG tablet Take 600 mg by mouth at bedtime and may repeat dose one time if needed.  No facility-administered medications prior to visit.    No Known Allergies  ROS Review of Systems  Constitutional: Negative for chills, fatigue and fever.  Respiratory: Negative for cough, shortness of breath and wheezing.   Cardiovascular: Positive for leg swelling. Negative for palpitations.  Gastrointestinal: Negative for abdominal pain.      Objective:    Physical Exam Vitals reviewed.  Constitutional:      Appearance: Normal appearance.  Cardiovascular:     Rate and Rhythm: Normal rate and regular rhythm.  Pulmonary:     Effort: Pulmonary effort is normal.     Breath sounds: Normal breath sounds.  Musculoskeletal:     Comments: Only trace edema ankles lower legs bilaterally  Neurological:     Mental Status: He is alert.     BP 110/78 (BP Location: Left Arm, Patient Position: Sitting, Cuff Size: Large)   Pulse 68   Temp 97.9 F (36.6 C) (Oral)   Ht 5\' 10"  (1.778 m)   Wt 195 lb 4.8 oz (88.6 kg)   SpO2 96%   BMI 28.02 kg/m  Wt Readings from Last 3 Encounters:  11/28/20 195 lb 4.8 oz (88.6 kg)  03/24/20 196 lb 6.4 oz (89.1 kg)  03/16/20 196 lb 6.4 oz (89.1 kg)     Health Maintenance Due  Topic Date Due  . Hepatitis C Screening  Never done  . COVID-19 Vaccine (4 - Booster for Pfizer series) 10/18/2020    There are no preventive care reminders to display for this patient.  Lab Results  Component Value Date   TSH 1.59 10/21/2013   Lab Results  Component Value Date   WBC 19.1 (H) 03/24/2020   HGB 13.4 03/24/2020   HCT 38.9 (L)  03/24/2020   MCV 96.5 03/24/2020   PLT 216 03/24/2020   Lab Results  Component Value Date   NA 137 03/24/2020   K 4.2 03/24/2020   CO2 26 03/24/2020   GLUCOSE 143 (H) 03/24/2020   BUN 17 03/24/2020   CREATININE 0.91 03/24/2020   BILITOT 1.0 11/17/2019   ALKPHOS 83 11/17/2019   AST 27 11/17/2019   ALT 24 11/17/2019   PROT 7.3 11/17/2019   ALBUMIN 3.9 11/17/2019   CALCIUM 8.9 03/24/2020   ANIONGAP 9 03/24/2020   GFR 89.64 11/17/2019   Lab Results  Component Value Date   CHOL 123 11/17/2019   Lab Results  Component Value Date   HDL 36.60 (L) 11/17/2019   Lab Results  Component Value Date   LDLCALC 65 11/17/2019   Lab Results  Component Value Date   TRIG 107.0 11/17/2019   Lab Results  Component Value Date   CHOLHDL 3 11/17/2019   Lab Results  Component Value Date   HGBA1C 6.0 11/17/2019      Assessment & Plan:   #1 bilateral leg edema.  Relatively mild.  He does take amlodipine which is likely contributing.  We mentioned other factors that can contribute include diastolic dysfunction or venous stasis.  Doubt worrisome etiology.  -Check further labs with TSH, hepatic panel, basic metabolic panel, urinalysis -Elevate legs frequently -His blood pressure is very well controlled and we will try discontinuing his amlodipine to see if his blood pressure tolerates this which that alone should help some with his edema  #2 hypertension-well-controlled Edema possibly related to amlodipine.  As above, will try discontinuing amlodipine and we have asked that he monitor his home blood pressure closely next few weeks and be in touch if consistently greater than  140/90 -Also encouraged to keep sodium intake down  #3 past history of adenocarcinoma the prostate -Patient requesting repeat PSA and this will be added to labs  No orders of the defined types were placed in this encounter.   Follow-up: No follow-ups on file.    Carolann Littler, MD

## 2020-11-29 LAB — BASIC METABOLIC PANEL
BUN: 16 mg/dL (ref 6–23)
CO2: 29 mEq/L (ref 19–32)
Calcium: 9.8 mg/dL (ref 8.4–10.5)
Chloride: 103 mEq/L (ref 96–112)
Creatinine, Ser: 0.85 mg/dL (ref 0.40–1.50)
GFR: 86.06 mL/min (ref >60.00)
Glucose, Bld: 87 mg/dL (ref 70–99)
Potassium: 4.3 mEq/L (ref 3.5–5.1)
Sodium: 139 mEq/L (ref 135–145)

## 2020-11-29 LAB — URINALYSIS
Bilirubin Urine: NEGATIVE
Hgb urine dipstick: NEGATIVE
Ketones, ur: NEGATIVE
Leukocytes,Ua: NEGATIVE
Nitrite: NEGATIVE
Specific Gravity, Urine: >=1.03 — AB (ref 1.000–1.030)
Total Protein, Urine: NEGATIVE
Urine Glucose: NEGATIVE
Urobilinogen, UA: 0.2 (ref 0.0–1.0)
pH: 5.5 (ref 5.0–8.0)

## 2020-11-29 LAB — PSA: PSA: 0 ng/mL — ABNORMAL LOW (ref 0.10–4.00)

## 2020-11-29 LAB — HEPATIC FUNCTION PANEL
ALT: 25 U/L (ref 0–53)
AST: 30 U/L (ref 0–37)
Albumin: 4 g/dL (ref 3.5–5.2)
Alkaline Phosphatase: 75 U/L (ref 39–117)
Bilirubin, Direct: 0.2 mg/dL (ref 0.0–0.3)
Total Bilirubin: 0.9 mg/dL (ref 0.2–1.2)
Total Protein: 7.2 g/dL (ref 6.0–8.3)

## 2020-11-29 LAB — TSH: TSH: 3.07 u[IU]/mL (ref 0.35–4.50)

## 2020-12-12 ENCOUNTER — Encounter: Payer: Self-pay | Admitting: Family Medicine

## 2020-12-14 DIAGNOSIS — H2513 Age-related nuclear cataract, bilateral: Secondary | ICD-10-CM | POA: Diagnosis not present

## 2020-12-14 DIAGNOSIS — H5203 Hypermetropia, bilateral: Secondary | ICD-10-CM | POA: Diagnosis not present

## 2020-12-22 DIAGNOSIS — Z23 Encounter for immunization: Secondary | ICD-10-CM | POA: Diagnosis not present

## 2020-12-29 ENCOUNTER — Encounter: Payer: Self-pay | Admitting: Family Medicine

## 2020-12-29 ENCOUNTER — Other Ambulatory Visit: Payer: Self-pay

## 2020-12-29 ENCOUNTER — Other Ambulatory Visit: Payer: Self-pay | Admitting: Family Medicine

## 2020-12-29 MED ORDER — CHLORTHALIDONE 25 MG PO TABS: ORAL_TABLET | ORAL | 0 refills | Status: DC

## 2021-01-10 DIAGNOSIS — M4712 Other spondylosis with myelopathy, cervical region: Secondary | ICD-10-CM | POA: Diagnosis not present

## 2021-01-10 DIAGNOSIS — Z6827 Body mass index (BMI) 27.0-27.9, adult: Secondary | ICD-10-CM | POA: Diagnosis not present

## 2021-01-10 DIAGNOSIS — M4316 Spondylolisthesis, lumbar region: Secondary | ICD-10-CM | POA: Diagnosis not present

## 2021-01-15 ENCOUNTER — Encounter: Payer: Self-pay | Admitting: Family Medicine

## 2021-01-17 MED ORDER — CHLORTHALIDONE 25 MG PO TABS: ORAL_TABLET | ORAL | 3 refills | Status: DC

## 2021-01-17 NOTE — Addendum Note (Signed)
Addended by: Rebecca Eaton on: 01/17/2021 02:23 PM   Modules accepted: Orders

## 2021-02-03 DIAGNOSIS — Z86010 Personal history of colonic polyps: Secondary | ICD-10-CM | POA: Diagnosis not present

## 2021-02-03 DIAGNOSIS — R490 Dysphonia: Secondary | ICD-10-CM | POA: Diagnosis not present

## 2021-02-03 DIAGNOSIS — R079 Chest pain, unspecified: Secondary | ICD-10-CM | POA: Diagnosis not present

## 2021-02-22 ENCOUNTER — Other Ambulatory Visit: Payer: Self-pay | Admitting: Family Medicine

## 2021-04-20 DIAGNOSIS — Z86010 Personal history of colonic polyps: Secondary | ICD-10-CM | POA: Diagnosis not present

## 2021-04-20 DIAGNOSIS — D120 Benign neoplasm of cecum: Secondary | ICD-10-CM | POA: Diagnosis not present

## 2021-04-25 DIAGNOSIS — D120 Benign neoplasm of cecum: Secondary | ICD-10-CM | POA: Diagnosis not present

## 2021-05-26 DIAGNOSIS — Z23 Encounter for immunization: Secondary | ICD-10-CM | POA: Diagnosis not present

## 2021-06-09 DIAGNOSIS — Z23 Encounter for immunization: Secondary | ICD-10-CM | POA: Diagnosis not present

## 2021-06-23 IMAGING — RF DG C-ARM 1-60 MIN
1 series · 2 of 2 positions shown · non-contrast
Comparison: 03/23/2020

CLINICAL DATA: L2-3 posterior fusion

EXAM:
DG C-ARM 1-60 MIN; LUMBAR SPINE - 2-3 VIEW

[Series 1: run · 2 of 2 slices shown]
[im 1/2]
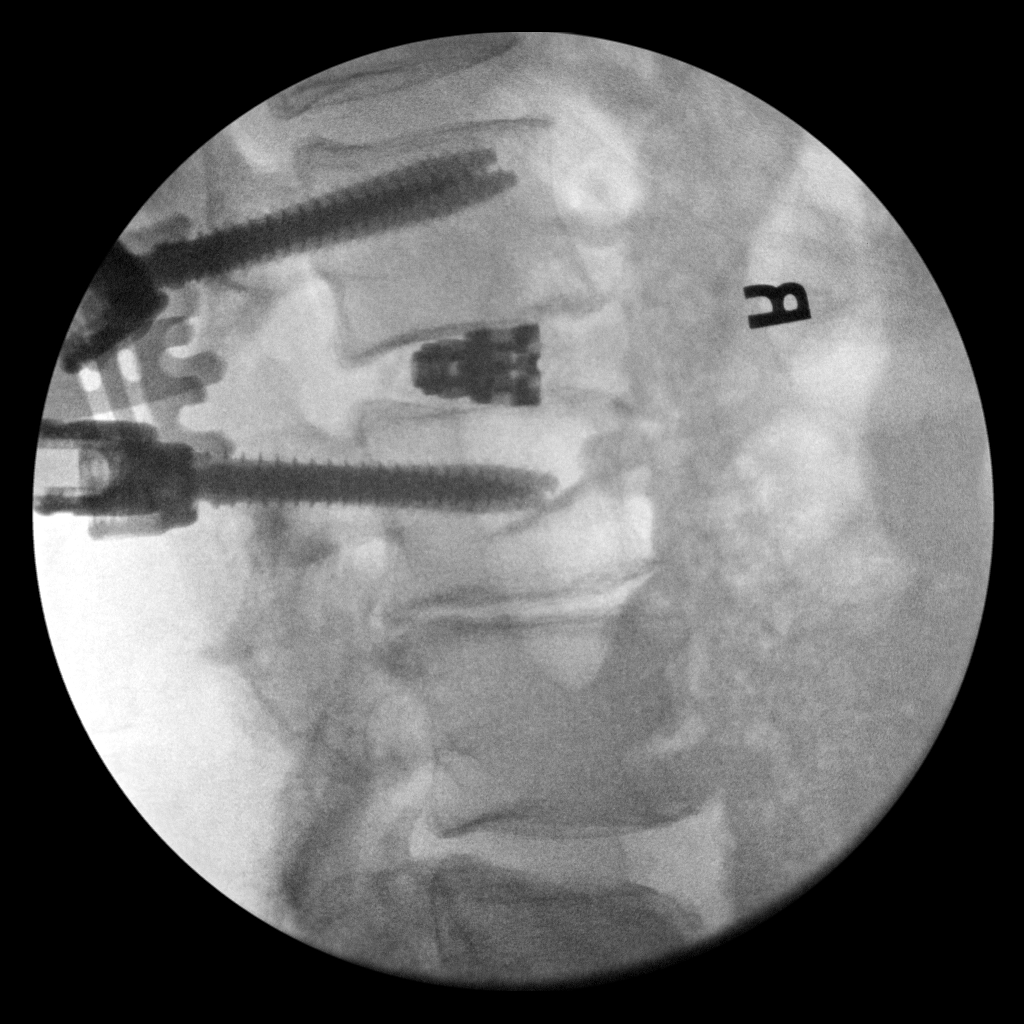
[im 2/2]
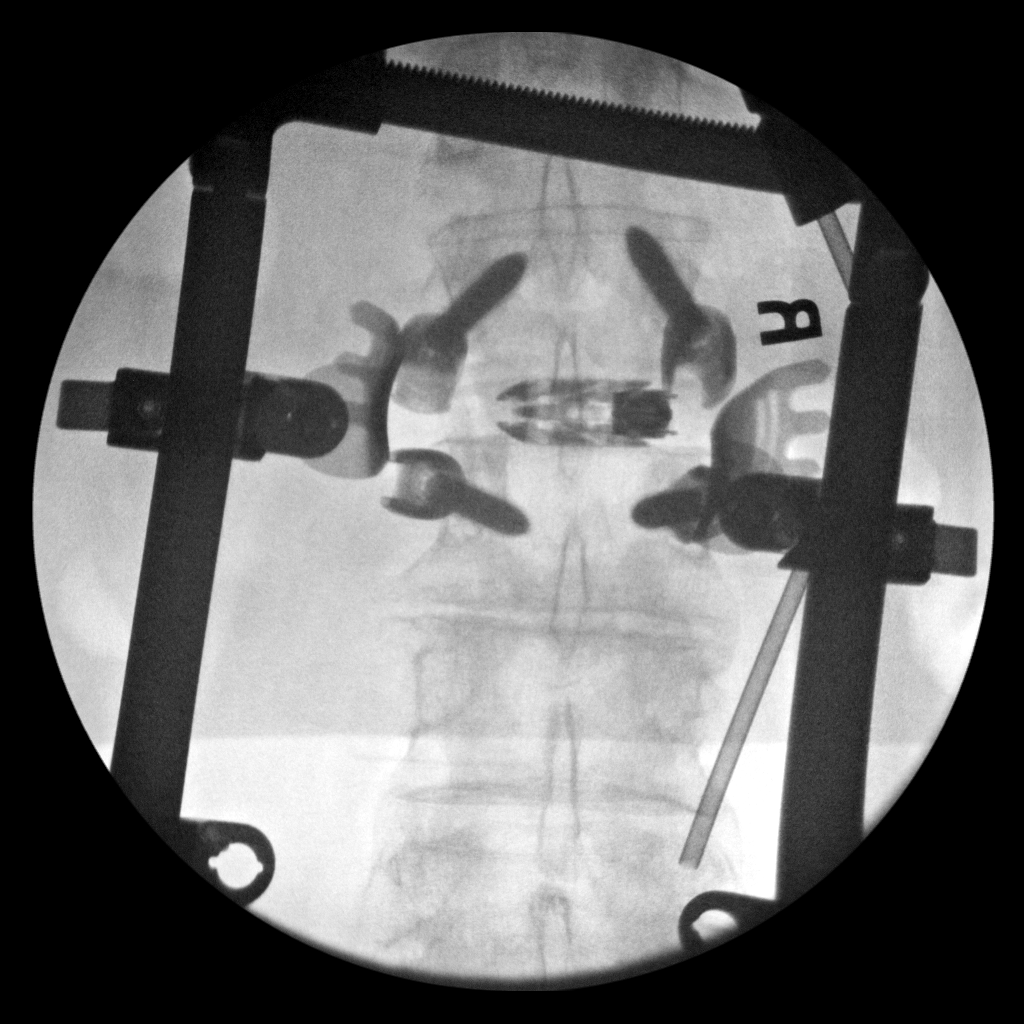

[2 of 2 positions shown; findings below may reference images not displayed]

FINDINGS: Spot fluoroscopic intraoperative views demonstrate interbody disc
spacer and bipedicular screws positioned in the lumbar spine at L2-3
when compared to the portable cross-table lateral view from earlier
today. Anatomic alignment.
IMPRESSION: Intraoperative views during L2-3 posterior fusion.

## 2021-06-25 ENCOUNTER — Other Ambulatory Visit: Payer: Self-pay | Admitting: Family Medicine

## 2021-07-11 DIAGNOSIS — G8929 Other chronic pain: Secondary | ICD-10-CM | POA: Diagnosis not present

## 2021-07-11 DIAGNOSIS — Z6828 Body mass index (BMI) 28.0-28.9, adult: Secondary | ICD-10-CM | POA: Diagnosis not present

## 2021-07-11 DIAGNOSIS — M4712 Other spondylosis with myelopathy, cervical region: Secondary | ICD-10-CM | POA: Diagnosis not present

## 2021-07-11 DIAGNOSIS — M545 Low back pain, unspecified: Secondary | ICD-10-CM | POA: Diagnosis not present

## 2021-07-20 DIAGNOSIS — M47816 Spondylosis without myelopathy or radiculopathy, lumbar region: Secondary | ICD-10-CM | POA: Diagnosis not present

## 2021-07-20 DIAGNOSIS — M545 Low back pain, unspecified: Secondary | ICD-10-CM | POA: Diagnosis not present

## 2021-08-15 DIAGNOSIS — Z6828 Body mass index (BMI) 28.0-28.9, adult: Secondary | ICD-10-CM | POA: Diagnosis not present

## 2021-08-15 DIAGNOSIS — M47816 Spondylosis without myelopathy or radiculopathy, lumbar region: Secondary | ICD-10-CM | POA: Diagnosis not present

## 2021-08-15 DIAGNOSIS — M4316 Spondylolisthesis, lumbar region: Secondary | ICD-10-CM | POA: Diagnosis not present

## 2021-08-26 ENCOUNTER — Other Ambulatory Visit: Payer: Self-pay | Admitting: Family Medicine

## 2021-08-28 NOTE — Telephone Encounter (Signed)
Please advise. I do not see this on the current med list.

## 2021-08-28 NOTE — Telephone Encounter (Signed)
Please advise. This medication was discontinued on 11/28/2020. Should the patient continue this medication?

## 2021-08-30 DIAGNOSIS — M5451 Vertebrogenic low back pain: Secondary | ICD-10-CM | POA: Diagnosis not present

## 2021-09-19 ENCOUNTER — Ambulatory Visit (INDEPENDENT_AMBULATORY_CARE_PROVIDER_SITE_OTHER): Payer: Medicare Other | Admitting: Family Medicine

## 2021-09-19 ENCOUNTER — Encounter: Payer: Self-pay | Admitting: Family Medicine

## 2021-09-19 VITALS — BP 110/60 | HR 71 | Temp 98.3°F | Ht 69.0 in | Wt 199.1 lb

## 2021-09-19 DIAGNOSIS — Z8546 Personal history of malignant neoplasm of prostate: Secondary | ICD-10-CM

## 2021-09-19 DIAGNOSIS — Z8249 Family history of ischemic heart disease and other diseases of the circulatory system: Secondary | ICD-10-CM | POA: Diagnosis not present

## 2021-09-19 DIAGNOSIS — Z136 Encounter for screening for cardiovascular disorders: Secondary | ICD-10-CM | POA: Diagnosis not present

## 2021-09-19 DIAGNOSIS — I10 Essential (primary) hypertension: Secondary | ICD-10-CM | POA: Diagnosis not present

## 2021-09-19 DIAGNOSIS — E785 Hyperlipidemia, unspecified: Secondary | ICD-10-CM | POA: Diagnosis not present

## 2021-09-19 DIAGNOSIS — Z1589 Genetic susceptibility to other disease: Secondary | ICD-10-CM

## 2021-09-19 DIAGNOSIS — M5451 Vertebrogenic low back pain: Secondary | ICD-10-CM | POA: Diagnosis not present

## 2021-09-19 LAB — LIPID PANEL
Cholesterol: 145 mg/dL (ref 0–200)
HDL: 35.6 mg/dL — ABNORMAL LOW (ref >39.00)
LDL Cholesterol: 70 mg/dL (ref 0–99)
NonHDL: 109.17
Total CHOL/HDL Ratio: 4
Triglycerides: 196 mg/dL — ABNORMAL HIGH (ref 0.0–149.0)
VLDL: 39.2 mg/dL (ref 0.0–40.0)

## 2021-09-19 LAB — HEPATIC FUNCTION PANEL
ALT: 25 U/L (ref 0–53)
AST: 28 U/L (ref 0–37)
Albumin: 4.3 g/dL (ref 3.5–5.2)
Alkaline Phosphatase: 67 U/L (ref 39–117)
Bilirubin, Direct: 0.2 mg/dL (ref 0.0–0.3)
Total Bilirubin: 1.1 mg/dL (ref 0.2–1.2)
Total Protein: 7.4 g/dL (ref 6.0–8.3)

## 2021-09-19 LAB — BASIC METABOLIC PANEL
BUN: 19 mg/dL (ref 6–23)
CO2: 28 mEq/L (ref 19–32)
Calcium: 9.7 mg/dL (ref 8.4–10.5)
Chloride: 102 mEq/L (ref 96–112)
Creatinine, Ser: 0.92 mg/dL (ref 0.40–1.50)
GFR: 81.92 mL/min (ref >60.00)
Glucose, Bld: 105 mg/dL — ABNORMAL HIGH (ref 70–99)
Potassium: 4.1 mEq/L (ref 3.5–5.1)
Sodium: 139 mEq/L (ref 135–145)

## 2021-09-19 LAB — PSA: PSA: 0.01 ng/mL — ABNORMAL LOW (ref 0.10–4.00)

## 2021-09-19 MED ORDER — CHLORTHALIDONE 25 MG PO TABS: ORAL_TABLET | ORAL | 5 refills | Status: DC

## 2021-09-19 MED ORDER — ALPRAZOLAM 0.5 MG PO TABS: 0.5000 mg | ORAL_TABLET | Freq: Three times a day (TID) | ORAL | 1 refills | Status: AC | PRN

## 2021-09-19 MED ORDER — OLMESARTAN MEDOXOMIL 40 MG PO TABS: 40.0000 mg | ORAL_TABLET | Freq: Every day | ORAL | 5 refills | Status: DC

## 2021-09-19 NOTE — Patient Instructions (Signed)
Let's increase the Benicar to 40 mg daily  OK to stop the Sertraline  I will set up Abdominal Ultrasound to rule out aneurysm.

## 2021-09-19 NOTE — Progress Notes (Signed)
Established Patient Office Visit  Subjective:  Patient ID: Micheal [name], male    DOB: December 29, 1945  Age: 76 y.o. MRN: [id number]  CC:  Chief Complaint  Patient presents with   Follow-up    HPI Micheal [name] presents for discussion of several items as follows  Longstanding history of hypertension.  He is currently on Benicar 20 mg daily, amlodipine 5 mg daily, and chlorthalidone 25 mg 1/2 tablet daily.  Recent blood pressures have been consistently well controlled in the mornings but has had several elevated p.m. readings over 150.  No headaches.  No chest pain.  Compliant with medications.  Longstanding history of anxiety.  Has taken sertraline in the past for several years and has tapered himself to 25 mg daily and hopes to stop altogether.  In the remote past he has infrequently used alprazolam 0.5 mg which he uses very infrequently for severe anxiety and insomnia.  He is requesting refill to use very sparingly.  He is aware of risk of benzodiazepines.  No history of misuse.  He is concerned because of family history of aneurysm in his father who had abdominal aneurysm age 43.  [name] has never smoked.  He does have additional risk factor of longstanding hypertension  He has hyperlipidemia treated with atorvastatin.  Overdue for follow-up lipids.  He has history of prostate cancer.  He is requesting repeat PSA.  Last level was 0.  Past Medical History:  Diagnosis Date   Anxiety    Basal cell carcinoma 04/23/2014   left lower leg bcc tx=cx3 69fu   BCC (basal cell carcinoma of skin) 02/26/2018   right mid nose bridge tx=mohs 04/17/18   Benign essential tremor    hands   Cancer ([place])    prostate cancer; skin cancers   Complication of anesthesia 1999   spinal with last knee replacement   Diverticulitis    last Saturday 03/24/16 -tx with antibiotics   GERD (gastroesophageal reflux disease)    H/O balanitis    recurrent intermittant-- uses diprolene cream prn   History of colon  polyps    History of kidney stones    History of prostate cancer urologist-  dr Alinda Money-  currently PSA nondetectable 10/ 2016   s/p  radial prostatectomy w/ nerve sparing 07-17-2007/   stage T1c,  Gleason 3+3=6,  PSA 6.33   Hyperlipidemia    Hypertension    Mild obstructive sleep apnea    uses mouth guard only   Nocturia    Osteoarthritis    knees   SCC (squamous cell carcinoma) 06/03/2013   right hand tx=cx3 24fu   Sigmoid diverticulosis    Squamous cell carcinoma of skin 06/30/2007   right ear =clear   Synovial hypertrophy of left knee    Urge urinary incontinence     Past Surgical History:  Procedure Laterality Date   BACK SURGERY     L4 surgery   CARDIOVASCULAR STRESS TEST  08-04-2014   normal perfusion nuclear study/  normal LV function and wall motion , ef 54%   COLONOSCOPY W/ POLYPECTOMY  06-21-2014   DIAGNOSTIC LAPAROSCOPY     FRACTURE SURGERY  1975   right thumb   HERNIA REPAIR     JOINT REPLACEMENT     KNEE ARTHROSCOPY Bilateral right 07-31-2009//  left    KNEE ARTHROSCOPY Left 12/08/2014   Procedure: ARTHROSCOPY LEFT KNEE WITH SYNOVECTOMY;  Surgeon: Gaynelle Arabian, MD;  Location: [facility];  Service: Orthopedics;  Laterality: Left;  KNEE ARTHROSCOPY Left 02/14/2016   Procedure: ARTHROSCOPY KNEE WTH SYNOVECTOMY;  Surgeon: Gaynelle Arabian, MD;  Location[facility];  Service: Orthopedics;  Laterality: Left;   LUMBAR DISC SURGERY  04-13-2002   left  L4 -- L5   ROBOT ASSISTED LAPAROSCOPIC RADICAL PROSTATECTOMY  07-17-2007   TONSILLECTOMY  as child   TOTAL KNEE ARTHROPLASTY Left 03-08-2008   TOTAL KNEE ARTHROPLASTY Right 04/02/2016   Procedure: RIGHT TOTAL KNEE ARTHROPLASTY;  Surgeon: Gaynelle Arabian, MD;  Location: [facility];  Service: Orthopedics;  Laterality: Right;    Family History  Problem Relation Age of Onset   Sudden death Mother    Aneurysm Father    Heart attack Other        grandfather    Social History   Socioeconomic  History   Marital status: Married    Spouse name: Not on file   Number of children: 2   Years of education: doctoral degree   Highest education level: Doctorate  Occupational History   Occupation: retired    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 in the Last Year: Never true  Transportation Needs: No Transportation Needs   Lack of Transportation (Medical): No   Lack of Transportation (Non-Medical): No  Physical Activity: Insufficiently Active   Days of Exercise per Week: 3 days   Minutes of Exercise per Session: 30 min  Stress: No Stress Concern Present   Feeling of Stress : Not at all  Social Connections: Moderately Integrated   Frequency of Communication with Friends and Family: Three times a week   Frequency of Social Gatherings with Friends and Family: Once a week   Attends Religious Services: More than 4 times per year   Active Member of [employer] or Organizations: No   Attends Music therapist: Never   Marital Status: Married  Human resources officer Violence: Not At Risk   Fear of Current or Ex-Partner: No   Emotionally Abused: No   Physically Abused: No   Sexually Abused: No    Outpatient Medications Prior to Visit  Medication Sig Dispense Refill   acetaminophen (TYLENOL) 500 MG tablet Take 1,000 mg by mouth every 6 (six) hours as needed for mild pain.      amLODipine (NORVASC) 5 MG tablet TAKE 1  TABLET BY MOUTH  DAILY 90 tablet 3   atorvastatin (LIPITOR) 20 MG tablet TAKE 1 TABLET BY MOUTH  DAILY AT 6 PM 90 tablet 3   oxyCODONE (OXY IR/ROXICODONE) 5 MG immediate release tablet Take 1 tablet (5 mg total) by mouth every 4 (four) hours as needed for moderate pain ((score 4 to 6)). 30 tablet 0   Probiotic Product (PROBIOTIC PO) Take 1 capsule by mouth daily.     sertraline (ZOLOFT) 50 MG tablet TAKE 1 TABLET BY  MOUTH  DAILY 90 tablet 3   Zoster Vaccine Adjuvanted [facility]) injection      chlorthalidone (HYGROTON) 25 MG tablet Take 1/2 tab by mouth daily 90 tablet 3   olmesartan (BENICAR) 20 MG tablet TAKE 1 TABLET BY MOUTH  DAILY 90 tablet 3   No facility-administered medications prior to visit.    No Known Allergies  ROS Review of Systems  Constitutional:  Negative for fatigue.  Eyes:  Negative for visual disturbance.  Respiratory:  Negative for cough, chest tightness and shortness of breath.   Cardiovascular:  Negative for chest pain, palpitations and leg swelling.  Neurological:  Negative for dizziness, syncope, weakness, light-headedness and headaches.     Objective:    Physical Exam Constitutional:      Appearance: He is well-developed.  HENT:     Right Ear: External ear normal.     Left Ear: External ear normal.  Eyes:     Pupils: Pupils are equal, round, and reactive to light.  Neck:     Thyroid: No thyromegaly.  Cardiovascular:     Rate and Rhythm: Normal rate and regular rhythm.  Pulmonary:     Effort: Pulmonary effort is normal. No respiratory distress.     Breath sounds: Normal breath sounds. No wheezing or rales.  Musculoskeletal:     Cervical back: Neck supple.     Right lower leg: No edema.     Left lower leg: No edema.  Neurological:     Mental Status: He is alert and oriented to person, place, and time.    BP 110/60 (BP Location: Left Arm, Patient Position: Sitting, Cuff Size: Normal)    Pulse 71    Temp 98.3 F (36.8 C) (Oral)    Ht 5\' 9"  (1.753  m)    Wt 199 lb 1.6 oz (90.3 kg)    SpO2 98%    BMI 29.40 kg/m  Wt Readings from Last 3 Encounters:  09/19/21 199 lb 1.6 oz (90.3 kg)  11/28/20 195 lb 4.8 oz (88.6 kg)  03/24/20 196 lb 6.4 oz (89.1 kg)     Health Maintenance Due  Topic Date Due   Hepatitis C Screening  Never done   COVID-19 Vaccine (4 - Booster for Pfizer series) 06/12/2020   INFLUENZA VACCINE  04/17/2021    There are no preventive care reminders to display for this patient.  Lab Results  Component Value Date   TSH 3.07 11/28/2020   Lab Results  Component Value Date   WBC 19.1 (H) 03/24/2020   HGB 13.4 03/24/2020   HCT 38.9 (L) 03/24/2020   MCV 96.5 03/24/2020   PLT 216 03/24/2020   Lab Results  Component Value Date   NA 139 11/28/2020   K 4.3 11/28/2020   CO2 29 11/28/2020   GLUCOSE 87 11/28/2020   BUN 16 11/28/2020   CREATININE 0.85 11/28/2020   BILITOT 0.9 11/28/2020   ALKPHOS 75 11/28/2020   AST 30 11/28/2020   ALT 25 11/28/2020   PROT 7.2 11/28/2020   ALBUMIN 4.0 11/28/2020   CALCIUM 9.8 11/28/2020   ANIONGAP 9 03/24/2020   GFR 86.06 11/28/2020   Lab Results  Component Value Date   CHOL 123 11/17/2019   Lab Results  Component Value Date   HDL 36.60 (L) 11/17/2019   Lab Results  Component Value Date   LDLCALC 65 11/17/2019   Lab Results  Component Value Date   TRIG 107.0 11/17/2019   Lab Results  Component Value Date  CHOLHDL 3 11/17/2019   Lab Results  Component Value Date   HGBA1C 6.0 11/17/2019      Assessment & Plan:   #1 hypertension.  Suboptimal control by late day readings though his morning readings have generally been well controlled.  We will titrate Benicar to 40 mg daily.  Check basic metabolic panel.  Reassess within a month  #2 hyperlipidemia treated with atorvastatin  -Recheck lipid and hepatic panel  #3 history of chronic intermittent anxiety.  Patient plans to taper off sertraline.  He requested refill of alprazolam.  We discussed limitations and  concerns with benzodiazepines including risk of fall.  We agreed to limited number of alprazolam 0.5 mg to use only for severe anxiety symptoms  #4 history of prostate cancer.  Patient requesting follow-up PSA  #5 positive family history of aortic aneurysm.  Patient requesting screening.   Meds ordered this encounter  Medications   olmesartan (BENICAR) 40 MG tablet    Sig: Take 1 tablet (40 mg total) by mouth daily.    Dispense:  30 tablet    Refill:  5   chlorthalidone (HYGROTON) 25 MG tablet    Sig: Take one tab by mouth daily    Dispense:  30 tablet    Refill:  5   ALPRAZolam (XANAX) 0.5 MG tablet    Sig: Take 1 tablet (0.5 mg total) by mouth 3 (three) times daily as needed for anxiety.    Dispense:  30 tablet    Refill:  1    Follow-up: Return in about 6 months (around 03/19/2022).    Carolann Littler, MD

## 2021-09-30 ENCOUNTER — Encounter: Payer: Self-pay | Admitting: Family Medicine

## 2021-10-01 ENCOUNTER — Encounter: Payer: Self-pay | Admitting: Family Medicine

## 2021-10-01 DIAGNOSIS — I129 Hypertensive chronic kidney disease with stage 1 through stage 4 chronic kidney disease, or unspecified chronic kidney disease: Secondary | ICD-10-CM

## 2021-10-01 DIAGNOSIS — I10 Essential (primary) hypertension: Secondary | ICD-10-CM

## 2021-10-01 DIAGNOSIS — Z8249 Family history of ischemic heart disease and other diseases of the circulatory system: Secondary | ICD-10-CM

## 2021-10-02 NOTE — Telephone Encounter (Signed)
Please advise. Where would I find the order for this?

## 2021-10-13 ENCOUNTER — Other Ambulatory Visit: Payer: Medicare Other

## 2021-10-17 ENCOUNTER — Ambulatory Visit
Admission: RE | Admit: 2021-10-17 | Discharge: 2021-10-17 | Disposition: A | Discharge status: Home / Self Care | Payer: Medicare Other | Source: Ambulatory Visit | Attending: Family Medicine | Admitting: Family Medicine

## 2021-10-17 DIAGNOSIS — I10 Essential (primary) hypertension: Secondary | ICD-10-CM

## 2021-10-17 DIAGNOSIS — Z8249 Family history of ischemic heart disease and other diseases of the circulatory system: Secondary | ICD-10-CM

## 2021-10-17 DIAGNOSIS — E785 Hyperlipidemia, unspecified: Secondary | ICD-10-CM | POA: Diagnosis not present

## 2021-10-17 DIAGNOSIS — I129 Hypertensive chronic kidney disease with stage 1 through stage 4 chronic kidney disease, or unspecified chronic kidney disease: Secondary | ICD-10-CM | POA: Diagnosis not present

## 2021-10-30 ENCOUNTER — Ambulatory Visit (INDEPENDENT_AMBULATORY_CARE_PROVIDER_SITE_OTHER): Payer: Medicare Other

## 2021-10-30 VITALS — BP 120/62 | HR 65 | Temp 98.4°F | Ht 69.0 in | Wt 198.5 lb

## 2021-10-30 DIAGNOSIS — Z0000 Encounter for general adult medical examination without abnormal findings: Secondary | ICD-10-CM | POA: Diagnosis not present

## 2021-10-30 NOTE — Patient Instructions (Addendum)
Micheal [name] , Thank you for taking time to come for your Medicare Wellness Visit. I appreciate your ongoing commitment to your health goals. Please review the following plan we discussed and let me know if I can assist you in the future.   These are the goals we discussed:  Goals       patient (pt-stated)      Continue fishing and reducing stress       Patient Stated      To maintain health  Catch the big one!      Patient Stated      I will continue to walk 2 miles per day        This is a list of the screening recommended for you and due dates:  Health Maintenance  Topic Date Due   COVID-19 Vaccine (5 - Booster for Pfizer series) 02/16/2021   Hepatitis C Screening: USPSTF Recommendation to screen - Ages 18-79 yo.  10/30/2022*   Tetanus Vaccine  12/26/2023   Colon Cancer Screening  04/21/2031   Pneumonia Vaccine  Completed   Flu Shot  Completed   Zoster (Shingles) Vaccine  Completed   HPV Vaccine  Aged Out  *Topic was postponed. The date shown is not the original due date.   Opioid Pain Medicine Management Opioids are powerful medicines that are used to treat moderate to severe pain. When used for short periods of time, they can help you to: Sleep better. Do better in physical or occupational therapy. Feel better in the first few days after an injury. Recover from surgery. Opioids should be taken with the supervision of a trained health care provider. They should be taken for the shortest period of time possible. This is because opioids can be addictive, and the longer you take opioids, the greater your risk of addiction. This addiction can also be called opioid use disorder. What are the risks? Using opioid pain medicines for longer than 3 days increases your risk of side effects. Side effects include: Constipation. Nausea and vomiting. Breathing difficulties (respiratory depression). Drowsiness. Confusion. Opioid use disorder. Itching. Taking opioid pain medicine  for a long period of time can affect your ability to do daily tasks. It also puts you at risk for: Motor vehicle crashes. Depression. Suicide. Heart attack. Overdose, which can be life-threatening. What is a pain treatment plan? A pain treatment plan is an agreement between you and your health care provider. Pain is unique to each person, and treatments vary depending on your condition. To manage your pain, you and your health care provider need to work together. To help you do this: Discuss the goals of your treatment, including how much pain you might expect to have and how you will manage the pain. Review the risks and benefits of taking opioid medicines. Remember that a good treatment plan uses more than one approach and minimizes the chance of side effects. Be honest about the amount of medicines you take and about any drug or alcohol use. Get pain medicine prescriptions from only one health care provider. Pain can be managed with many types of alternative treatments. Ask your health care provider to refer you to one or more specialists who can help you manage pain through: Physical or occupational therapy. Counseling (cognitive behavioral therapy). Good nutrition. Biofeedback. Massage. Meditation. Non-opioid medicine. Following a gentle exercise program. How to use opioid pain medicine Taking medicine Take your pain medicine exactly as told by your health care provider. Take it only when you need  it. If your pain gets less severe, you may take less than your prescribed dose if your health care provider approves. If you are not having pain, do nottake pain medicine unless your health care provider tells you to take it. If your pain is severe, do nottry to treat it yourself by taking more pills than instructed on your prescription. Contact your health care provider for help. Write down the times when you take your pain medicine. It is easy to become confused while on pain medicine.  Writing the time can help you avoid overdose. Take other over-the-counter or prescription medicines only as told by your health care provider. Keeping yourself and others safe  While you are taking opioid pain medicine: Do not drive, use machinery, or power tools. Do not sign legal documents. Do not drink alcohol. Do not take sleeping pills. Do not supervise children by yourself. Do not do activities that require climbing or being in high places. Do not go to a lake, river, ocean, spa, or swimming pool. Do not share your pain medicine with anyone. Keep pain medicine in a locked cabinet or in a secure area where pets and children cannot reach it. Stopping your use of opioids If you have been taking opioid medicine for more than a few weeks, you may need to slowly decrease (taper) how much you take until you stop completely. Tapering your use of opioids can decrease your risk of symptoms of withdrawal, such as: Pain and cramping in the abdomen. Nausea. Sweating. Sleepiness. Restlessness. Uncontrollable shaking (tremors). Cravings for the medicine. Do not attempt to taper your use of opioids on your own. Talk with your health care provider about how to do this. Your health care provider may prescribe a step-down schedule based on how much medicine you are taking and how long you have been taking it. Getting rid of leftover pills Do not save any leftover pills. Get rid of leftover pills safely by: Taking the medicine to a prescription take-back program. This is usually offered by the county or law enforcement. Bringing them to a pharmacy that has a drug disposal container. Flushing them down the toilet. Check the label or package insert of your medicine to see whether this is safe to do. Throwing them out in the trash. Check the label or package insert of your medicine to see whether this is safe to do. If it is safe to throw it out, remove the medicine from the original container, put it  into a sealable bag or container, and mix it with used coffee grounds, food scraps, dirt, or cat litter before putting it in the trash. Follow these instructions at home: Activity Do exercises as told by your health care provider. Avoid activities that make your pain worse. Return to your normal activities as told by your health care provider. Ask your health care provider what activities are safe for you. General instructions You may need to take these actions to prevent or treat constipation: Drink enough fluid to keep your urine pale yellow. Take over-the-counter or prescription medicines. Eat foods that are high in fiber, such as beans, whole grains, and fresh fruits and vegetables. Limit foods that are high in fat and processed sugars, such as fried or sweet foods. Keep all follow-up visits. This is important. Where to find support If you have been taking opioids for a long time, you may benefit from receiving support for quitting from a local support group or counselor. Ask your health care provider for a  referral to these resources in your area. Where to find more information Centers for Disease Control and Prevention (CDC): http://www.wolf.info/ [place] Food and Drug Administration (FDA): [place] Get help right away if: You may have taken too much of an opioid (overdosed). Common symptoms of an overdose: Your breathing is slower or more shallow than normal. You have a very slow heartbeat (pulse). You have slurred speech. You have nausea and vomiting. Your pupils become very small. You have other potential symptoms: You are very confused. You faint or feel like you will faint. You have cold, clammy skin. You have blue lips or fingernails. You have thoughts of harming yourself or harming others. These symptoms may represent a serious problem that is an emergency. Do not wait to see if the symptoms will go away. Get medical help right away. Call your local emergency services (911 in the  [place] Do not drive yourself to the hospital.  If you ever feel like you may hurt yourself or others, or have thoughts about taking your own life, get help right away. Go to your nearest emergency department or: Call your local emergency services (911 in the [place] Call the [facility] 3808757876 in the [place] Call a suicide crisis helpline, such as the [facility] at (509)850-2203 or 988 in the [place]. This is open 24 hours a day in the [place] Text the Crisis Text Line at (430)573-5964 (in the [place].). Summary Opioid medicines can help you manage moderate to severe pain for a short period of time. A pain treatment plan is an agreement between you and your health care provider. Discuss the goals of your treatment, including how much pain you might expect to have and how you will manage the pain. If you think that you or someone else may have taken too much of an opioid, get medical help right away. This information is not intended to replace advice given to you by your health care provider. Make sure you discuss any questions you have with your health care provider. Document Revised: 03/29/2021 Document Reviewed: 12/14/2020 Elsevier Patient Education  [place] directives: Yes Patient will bring copy  Conditions/risks identified: None  Next appointment: Follow up in one year for your annual wellness visit.   Preventive Care 31 Years and Older, Male Preventive care refers to lifestyle choices and visits with your health care provider that can promote health and wellness. What does preventive care include? A yearly physical exam. This is also called an annual well check. Dental exams once or twice a year. Routine eye exams. Ask your health care provider how often you should have your eyes checked. Personal lifestyle choices, including: Daily care of your teeth and gums. Regular physical activity. Eating a healthy diet. Avoiding  tobacco and drug use. Limiting alcohol use. Practicing safe sex. Taking low doses of aspirin every day. Taking vitamin and mineral supplements as recommended by your health care provider. What happens during an annual well check? The services and screenings done by your health care provider during your annual well check will depend on your age, overall health, lifestyle risk factors, and family history of disease. Counseling  Your health care provider may ask you questions about your: Alcohol use. Tobacco use. Drug use. Emotional well-being. Home and relationship well-being. Sexual activity. Eating habits. History of falls. Memory and ability to understand (cognition). Work and work Statistician. Screening  You may have the following tests or measurements: Height, weight, and BMI. Blood pressure. Lipid  and cholesterol levels. These may be checked every 5 years, or more frequently if you are over 59 years old. Skin check. Lung cancer screening. You may have this screening every year starting at age 65 if you have a 30-pack-year history of smoking and currently smoke or have quit within the past 15 years. Fecal occult blood test (FOBT) of the stool. You may have this test every year starting at age 40. Flexible sigmoidoscopy or colonoscopy. You may have a sigmoidoscopy every 5 years or a colonoscopy every 10 years starting at age 69. Prostate cancer screening. Recommendations will vary depending on your family history and other risks. Hepatitis C blood test. Hepatitis B blood test. Sexually transmitted disease (STD) testing. Diabetes screening. This is done by checking your blood sugar (glucose) after you have not eaten for a while (fasting). You may have this done every 1-3 years. Abdominal aortic aneurysm (AAA) screening. You may need this if you are a current or former smoker. Osteoporosis. You may be screened starting at age 86 if you are at high risk. Talk with your health care  provider about your test results, treatment options, and if necessary, the need for more tests. Vaccines  Your health care provider may recommend certain vaccines, such as: Influenza vaccine. This is recommended every year. Tetanus, diphtheria, and acellular pertussis (Tdap, Td) vaccine. You may need a Td booster every 10 years. Zoster vaccine. You may need this after age 43. Pneumococcal 13-valent conjugate (PCV13) vaccine. One dose is recommended after age 70. Pneumococcal polysaccharide (PPSV23) vaccine. One dose is recommended after age 90. Talk to your health care provider about which screenings and vaccines you need and how often you need them. This information is not intended to replace advice given to you by your health care provider. Make sure you discuss any questions you have with your health care provider. Document Released: 09/30/2015 Document Revised: 05/23/2016 Document Reviewed: 07/05/2015 Elsevier Interactive Patient Education  2017 [place] Prevention in the Home Falls can cause injuries. They can happen to people of all ages. There are many things you can do to make your home safe and to help prevent falls. What can I do on the outside of my home? Regularly fix the edges of walkways and driveways and fix any cracks. Remove anything that might make you trip as you walk through a door, such as a raised step or threshold. Trim any bushes or trees on the path to your home. Use bright outdoor lighting. Clear any walking paths of anything that might make someone trip, such as rocks or tools. Regularly check to see if handrails are loose or broken. Make sure that both sides of any steps have handrails. Any raised decks and porches should have guardrails on the edges. Have any leaves, snow, or ice cleared regularly. Use sand or salt on walking paths during winter. Clean up any spills in your garage right away. This includes oil or grease spills. What can I do in the  bathroom? Use night lights. Install grab bars by the toilet and in the tub and shower. Do not use towel bars as grab bars. Use non-skid mats or decals in the tub or shower. If you need to sit down in the shower, use a plastic, non-slip stool. Keep the floor dry. Clean up any water that spills on the floor as soon as it happens. Remove soap buildup in the tub or shower regularly. Attach bath mats securely with double-sided non-slip rug tape. Do  not have throw rugs and other things on the floor that can make you trip. What can I do in the bedroom? Use night lights. Make sure that you have a light by your bed that is easy to reach. Do not use any sheets or blankets that are too big for your bed. They should not hang down onto the floor. Have a firm chair that has side arms. You can use this for support while you get dressed. Do not have throw rugs and other things on the floor that can make you trip. What can I do in the kitchen? Clean up any spills right away. Avoid walking on wet floors. Keep items that you use a lot in easy-to-reach places. If you need to reach something above you, use a strong step stool that has a grab bar. Keep electrical cords out of the way. Do not use floor polish or wax that makes floors slippery. If you must use wax, use non-skid floor wax. Do not have throw rugs and other things on the floor that can make you trip. What can I do with my stairs? Do not leave any items on the stairs. Make sure that there are handrails on both sides of the stairs and use them. Fix handrails that are broken or loose. Make sure that handrails are as long as the stairways. Check any carpeting to make sure that it is firmly attached to the stairs. Fix any carpet that is loose or worn. Avoid having throw rugs at the top or bottom of the stairs. If you do have throw rugs, attach them to the floor with carpet tape. Make sure that you have a light switch at the top of the stairs and the  bottom of the stairs. If you do not have them, ask someone to add them for you. What else can I do to help prevent falls? Wear shoes that: Do not have high heels. Have rubber bottoms. Are comfortable and fit you well. Are closed at the toe. Do not wear sandals. If you use a stepladder: Make sure that it is fully opened. Do not climb a closed stepladder. Make sure that both sides of the stepladder are locked into place. Ask someone to hold it for you, if possible. Clearly mark and make sure that you can see: Any grab bars or handrails. First and last steps. Where the edge of each step is. Use tools that help you move around (mobility aids) if they are needed. These include: Canes. Walkers. Scooters. Crutches. Turn on the lights when you go into a dark area. Replace any light bulbs as soon as they burn out. Set up your furniture so you have a clear path. Avoid moving your furniture around. If any of your floors are uneven, fix them. If there are any pets around you, be aware of where they are. Review your medicines with your doctor. Some medicines can make you feel dizzy. This can increase your chance of falling. Ask your doctor what other things that you can do to help prevent falls. This information is not intended to replace advice given to you by your health care provider. Make sure you discuss any questions you have with your health care provider. Document Released: 06/30/2009 Document Revised: 02/09/2016 Document Reviewed: 10/08/2014 Elsevier Interactive Patient Education  2017 [employer].

## 2021-10-30 NOTE — Progress Notes (Signed)
Subjective:   Micheal [name] is a 76 y.o. male who presents for Medicare Annual/Subsequent preventive examination.  Review of Systems         Objective:    Today's Vitals   10/30/21 1031  BP: 120/62  Pulse: 65  Temp: 98.4 F (36.9 C)  TempSrc: Oral  SpO2: 99%  Weight: 198 lb 8 oz (90 kg)  Height: 5\' 9"  (1.753 m)   Body mass index is 29.31 kg/m.  Advanced Directives 10/30/2021 10/27/2020 03/24/2020 03/16/2020 08/25/2019 07/29/2018 12/18/2016  Does Patient Have a Medical Advance Directive? Yes Yes No;Yes No;Yes Yes Yes Yes  Type of Paramedic of [place];Living will [place];Living will [place];Living will [place];Living will [place];Living will - [place];Living will  Does patient want to make changes to medical advance directive? No - Patient declined No - Patient declined No - Patient declined - No - Patient declined - -  Copy of [place] in Chart? No - copy requested No - copy requested No - copy requested No - copy requested No - copy requested - -    Current Medications (verified) Outpatient Encounter Medications as of 10/30/2021  Medication Sig   acetaminophen (TYLENOL) 500 MG tablet Take 1,000 mg by mouth every 6 (six) hours as needed for mild pain.    ALPRAZolam (XANAX) 0.5 MG tablet Take 1 tablet (0.5 mg total) by mouth 3 (three) times daily as needed for anxiety.   amLODipine (NORVASC) 5 MG tablet TAKE 1 TABLET BY MOUTH  DAILY   atorvastatin (LIPITOR) 20 MG tablet TAKE 1 TABLET BY MOUTH  DAILY AT 6 PM   chlorthalidone (HYGROTON) 25 MG tablet Take one tab by mouth daily   olmesartan (BENICAR) 40 MG tablet Take 1 tablet (40 mg total) by mouth daily.   oxyCODONE (OXY IR/ROXICODONE) 5 MG immediate release tablet Take 1 tablet (5 mg total) by mouth every 4 (four) hours as needed for moderate pain ((score 4 to 6)).   Probiotic  Product (PROBIOTIC PO) Take 1 capsule by mouth daily.   sertraline (ZOLOFT) 50 MG tablet TAKE 1 TABLET BY MOUTH  DAILY   Zoster Vaccine Adjuvanted [facility]) injection    No facility-administered encounter medications on file as of 10/30/2021.    Allergies (verified) Patient has no known allergies.   History: Past Medical History:  Diagnosis Date   Anxiety    Basal cell carcinoma 04/23/2014   left lower leg bcc tx=cx3 51fu   BCC (basal cell carcinoma of skin) 02/26/2018   right mid nose bridge tx=mohs 04/17/18   Benign essential tremor    hands   Cancer ([place])    prostate cancer; skin cancers   Complication of anesthesia 1999   spinal with last knee replacement   Diverticulitis    last Saturday 03/24/16 -tx with antibiotics   GERD (gastroesophageal reflux disease)    H/O balanitis    recurrent intermittant-- uses diprolene cream prn   History of colon polyps    History of kidney stones    History of prostate cancer urologist-  dr Alinda Money-  currently PSA nondetectable 10/ 2016   s/p  radial prostatectomy w/ nerve sparing 07-17-2007/   stage T1c,  Gleason 3+3=6,  PSA 6.33   Hyperlipidemia    Hypertension    Mild obstructive sleep apnea    uses mouth guard only   Nocturia    Osteoarthritis  knees   SCC (squamous cell carcinoma) 06/03/2013   right hand tx=cx3 51fu   Sigmoid diverticulosis    Squamous cell carcinoma of skin 06/30/2007   right ear =clear   Synovial hypertrophy of left knee    Urge urinary incontinence    Past Surgical History:  Procedure Laterality Date   BACK SURGERY     L4 surgery   CARDIOVASCULAR STRESS TEST  08-04-2014   normal perfusion nuclear study/  normal LV function and wall motion , ef 54%   COLONOSCOPY W/ POLYPECTOMY  06-21-2014   DIAGNOSTIC LAPAROSCOPY     FRACTURE SURGERY  1975   right thumb   HERNIA REPAIR     JOINT REPLACEMENT     KNEE ARTHROSCOPY Bilateral right 07-31-2009//  left    KNEE ARTHROSCOPY Left 12/08/2014   Procedure:  ARTHROSCOPY LEFT KNEE WITH SYNOVECTOMY;  Surgeon: Gaynelle Arabian, MD;  Location: [facility];  Service: Orthopedics;  Laterality: Left;   KNEE ARTHROSCOPY Left 02/14/2016   Procedure: ARTHROSCOPY KNEE WTH SYNOVECTOMY;  Surgeon: Gaynelle Arabian, MD;  Location: [facility];  Service: Orthopedics;  Laterality: Left;   LUMBAR DISC SURGERY  04-13-2002   left  L4 -- L5   ROBOT ASSISTED LAPAROSCOPIC RADICAL PROSTATECTOMY  07-17-2007   TONSILLECTOMY  as child   TOTAL KNEE ARTHROPLASTY Left 03-08-2008   TOTAL KNEE ARTHROPLASTY Right 04/02/2016   Procedure: RIGHT TOTAL KNEE ARTHROPLASTY;  Surgeon: Gaynelle Arabian, MD;  Location: [facility];  Service: Orthopedics;  Laterality: Right;   Family History  Problem Relation Age of Onset   Sudden death Mother    Aneurysm Father    Heart attack Other        grandfather   Social History   Socioeconomic History   Marital status: Married    Spouse name: Not on file   Number of children: 2   Years of education: doctoral degree   Highest education level: Doctorate  Occupational History   Occupation: retired    Fish farm manager: [employer]    Comment: sales  Tobacco Use   Smoking status: Never   Smokeless tobacco: Never  Vaping Use   Vaping Use: Never used  Substance and Sexual Activity   Alcohol use: Yes    Comment: social 1-2 weekly   Drug use: No   Sexual activity: Not on file  Other Topics Concern   Not on file  Social History Narrative   Not on file   Social Determinants of Health   Financial Resource Strain: Low Risk    Difficulty of Paying Living Expenses: Not hard at all  Food Insecurity: No Food Insecurity   Worried About Charity fundraiser in the Last Year: Never true   [place] in the Last Year: Never true  Transportation Needs: No Transportation Needs   Lack of Transportation (Medical): No   Lack of Transportation (Non-Medical): No  Physical Activity: Insufficiently Active   Days of Exercise  per Week: 1 day   Minutes of Exercise per Session: 20 min  Stress: No Stress Concern Present   Feeling of Stress : Only a little  Social Connections: Engineer, building services of Communication with Friends and Family: More than three times a week   Frequency of Social Gatherings with Friends and Family: Once a week   Attends Religious Services: More than 4 times per year   Active Member of [employer] or Organizations: Yes   Attends Archivist Meetings: More than 4  times per year   Marital Status: Married     Clinical Intake:  Pre-visit preparation completed: Yes  Pain : No/denies pain     BMI - recorded: 25.39 Nutritional Status: BMI 25 -29 Overweight Nutritional Risks: None Diabetes: No  How often do you need to have someone help you when you read instructions, pamphlets, or other written materials from your doctor or pharmacy?: 1 - Never  Diabetic? No  Activities of Daily Living In your present state of health, do you have any difficulty performing the following activities: 10/30/2021 10/26/2021  Hearing? N N  Vision? N N  Difficulty concentrating or making decisions? N N  Walking or climbing stairs? N N  Dressing or bathing? N N  Doing errands, shopping? N N  Preparing Food and eating ? N N  Using the Toilet? N N  In the past six months, have you accidently leaked urine? N Y  Do you have problems with loss of bowel control? N N  Managing your Medications? N N  Managing your Finances? N N  Housekeeping or managing your Housekeeping? - N  Some recent data might be hidden    Patient Care Team: [name], MD as PCP - General Leeroy Cha, MD as Attending Physician (Neurosurgery) [name], MD as Consulting Physician (Dermatology)  Indicate any recent Medical Services you may have received from other than [facility] providers in the past year (date may be approximate).     Assessment:   This is a routine wellness examination for  Vian.  Hearing/Vision screen Hearing Screening - Comments:: No difficulty hearing Vision Screening - Comments:: Wears glasses. Followed by Dr Ellie Lunch  Dietary issues and exercise activities discussed: Current Exercise Habits: Home exercise routine, Type of exercise: walking, Time (Minutes): 20, Frequency (Times/Week): 1, Weekly Exercise (Minutes/Week): 20, Intensity: Moderate, Exercise limited by: None identified   Goals Addressed               This Visit's Progress     patient (pt-stated)        Continue fishing and reducing stress        Depression Screen PHQ 2/9 Scores 10/30/2021 10/27/2020 08/25/2019 07/29/2018 07/24/2017 12/19/2016 12/18/2016  PHQ - 2 Score 0 0 0 0 0 0 0    Fall Risk Fall Risk  10/30/2021 10/26/2021 09/15/2021 10/27/2020 08/25/2019  Falls in the past year? 0 0 0 0 1  Comment - - - - -  Number falls in past yr: 0 0 - 0 0  Comment - - - - tripped over a log in the back yard; no injury  Injury with Fall? 0 0 - 0 -  Risk for fall due to : No Fall Risks - - No Fall Risks History of fall(s)  Follow up - - - Falls evaluation completed;Falls prevention discussed Falls evaluation completed;Education provided;Falls prevention discussed    FALL RISK PREVENTION PERTAINING TO THE HOME:  Any stairs in or around the home? Yes  If so, are there any without handrails? No  Home free of loose throw rugs in walkways, pet beds, electrical cords, etc? Yes  Adequate lighting in your home to reduce risk of falls? Yes   ASSISTIVE DEVICES UTILIZED TO PREVENT FALLS:  Life alert? No  Use of a cane, walker or w/c? No  Grab bars in the bathroom? No  Shower chair or bench in shower? No  Elevated toilet seat or a handicapped toilet? Yes   TIMED UP AND GO:  Was the test  performed? Yes .  Length of time to ambulate 10 feet: 5 sec.   Gait steady and fast without use of assistive device  Cognitive Function: MMSE - Mini Mental State Exam 07/29/2018 07/29/2018  Not completed: (No  Data) (No Data)     6CIT Screen 10/30/2021  What Year? 0 points  What month? 0 points  What time? 0 points  Count back from 20 0 points  Months in reverse 0 points  Repeat phrase 0 points  Total Score 0    Immunizations Immunization History  Administered Date(s) Administered   Influenza, High Dose Seasonal PF 06/22/2014, 05/17/2019, 05/17/2019, 06/09/2021   Influenza-Unspecified 07/19/2015, 06/20/2016, 06/10/2017, 06/09/2018   PFIZER(Purple Top)SARS-COV-2 Vaccination 10/06/2019, 10/27/2019, 04/17/2020, 12/22/2020   Pneumococcal Conjugate-13 10/26/2013   Pneumococcal Polysaccharide-23 02/16/2007, 10/31/2015   Td 10/18/2004   Tdap 12/25/2013   Zoster Recombinat (Shingrix) 05/08/2018, 07/20/2018   Zoster, Live 02/16/2007    TDAP status: Up to date  Flu Vaccine status: Up to date  Pneumococcal vaccine status: Up to date  Covid-19 vaccine status: Completed vaccines  Qualifies for Shingles Vaccine? Yes   Zostavax completed Yes   Shingrix Completed?: Yes  Screening Tests Health Maintenance  Topic Date Due   COVID-19 Vaccine (5 - Booster for Pfizer series) 02/16/2021   Hepatitis C Screening  10/30/2022 (Originally 03/04/1964)   TETANUS/TDAP  12/26/2023   COLONOSCOPY (Pts 45-41yrs Insurance coverage will need to be confirmed)  04/21/2031   Pneumonia Vaccine 47+ Years old  Completed   INFLUENZA VACCINE  Completed   Zoster Vaccines- Shingrix  Completed   HPV VACCINES  Aged Out    Health Maintenance  Health Maintenance Due  Topic Date Due   COVID-19 Vaccine (5 - Booster for [place] series) 02/16/2021    Colorectal cancer screening: Type of screening: Colonoscopy. Completed 04/20/21. Repeat every 10 years  Lung Cancer Screening: (Low Dose CT Chest recommended if Age 57-80 years, 30 pack-year currently smoking OR have quit w/in 15years.) does not qualify.   Additional Screening:  Hepatitis C Screening: does qualify; Completed Patient deferred  Vision Screening:  Recommended annual ophthalmology exams for early detection of glaucoma and other disorders of the eye. Is the patient up to date with their annual eye exam?  Yes  Who is the provider or what is the name of the office in which the patient attends annual eye exams? Dr Lestine Box If pt is not established with a provider, would they like to be referred to a provider to establish care? No .   Dental Screening: Recommended annual dental exams for proper oral hygiene  Community Resource Referral / Chronic Care Management:  CRR required this visit?  No   CCM required this visit?  No      Plan:     I have personally reviewed and noted the following in the patients chart:   Medical and social history Use of alcohol, tobacco or illicit drugs  Current medications and supplements including opioid prescriptions. Patient is currently taking opioid prescriptions. Information provided to patient regarding non-opioid alternatives. Patient advised to discuss non-opioid treatment plan with their provider. Functional ability and status Nutritional status Physical activity Advanced directives List of other physicians Hospitalizations, surgeries, and ER visits in previous 12 months Vitals Screenings to include cognitive, depression, and falls Referrals and appointments  In addition, I have reviewed and discussed with patient certain preventive protocols, quality metrics, and best practice recommendations. A written personalized care plan for preventive services as well as general preventive health recommendations  were provided to patient.     Criselda Peaches, LPN   [id number]   Nurse Notes: None

## 2021-11-01 ENCOUNTER — Other Ambulatory Visit: Payer: Self-pay

## 2021-11-01 ENCOUNTER — Ambulatory Visit (INDEPENDENT_AMBULATORY_CARE_PROVIDER_SITE_OTHER): Payer: Medicare Other | Admitting: Dermatology

## 2021-11-01 ENCOUNTER — Encounter: Payer: Self-pay | Admitting: Dermatology

## 2021-11-01 DIAGNOSIS — Z85828 Personal history of other malignant neoplasm of skin: Secondary | ICD-10-CM | POA: Diagnosis not present

## 2021-11-01 DIAGNOSIS — L821 Other seborrheic keratosis: Secondary | ICD-10-CM | POA: Diagnosis not present

## 2021-11-01 DIAGNOSIS — Z1283 Encounter for screening for malignant neoplasm of skin: Secondary | ICD-10-CM | POA: Diagnosis not present

## 2021-11-01 DIAGNOSIS — D1801 Hemangioma of skin and subcutaneous tissue: Secondary | ICD-10-CM

## 2021-11-01 DIAGNOSIS — D044 Carcinoma in situ of skin of scalp and neck: Secondary | ICD-10-CM | POA: Diagnosis not present

## 2021-11-01 DIAGNOSIS — D485 Neoplasm of uncertain behavior of skin: Secondary | ICD-10-CM

## 2021-11-01 DIAGNOSIS — L570 Actinic keratosis: Secondary | ICD-10-CM | POA: Diagnosis not present

## 2021-11-08 DIAGNOSIS — H2513 Age-related nuclear cataract, bilateral: Secondary | ICD-10-CM | POA: Diagnosis not present

## 2021-11-08 DIAGNOSIS — H52203 Unspecified astigmatism, bilateral: Secondary | ICD-10-CM | POA: Diagnosis not present

## 2021-11-15 ENCOUNTER — Ambulatory Visit: Payer: Medicare Other | Admitting: Dermatology

## 2021-11-17 ENCOUNTER — Encounter: Payer: Self-pay | Admitting: Dermatology

## 2021-11-17 DIAGNOSIS — M47816 Spondylosis without myelopathy or radiculopathy, lumbar region: Secondary | ICD-10-CM | POA: Diagnosis not present

## 2021-11-17 NOTE — Progress Notes (Signed)
Follow-Up Visit   Subjective  Micheal [name] is a 76 y.o. male who presents for the following: Skin Problem (Lesion on head x several months- itching. Lesion right hand x days. Lesion on left ear x several months. Personal history of bcc and scc. ).  Skin examination, new crusts head and hand Location:  Duration:  Quality:  Associated Signs/Symptoms: Modifying Factors:  Severity:  Timing: Context:   Objective  Well appearing patient in no apparent distress; mood and affect are within normal limits. Scalp Waist up exam: No atypical pigmented lesions.  1 possible new nonmelanoma skin cancer scalp will be biopsied and treated.  Left Abdomen (side) - Upper 3 mm smooth red dermal papule  Mid Back (10), Neck - Anterior Tan flattopped textured papule with compatible dermoscopy  Left Dorsal Hand (2), Right Dorsal Hand (2), Right Forearm - Posterior, Right Temporal Scalp Multiple gritty and hornlike 3 to 5 mm pink crusts  Mid Parietal Scalp 1 cm waxy pink crust         All skin waist up examined.   Assessment & Plan    Encounter for screening for malignant neoplasm of skin Scalp  Patient will be moving to [place] so provided with his pathology report records.  Hemangioma of skin Left Abdomen (side) - Upper  No intervention necessary  Seborrheic keratosis (11) Neck - Anterior; Mid Back (10)  Recheck as needed change  AK (actinic keratosis) (6) Right Forearm - Posterior; Left Dorsal Hand (2); Right Dorsal Hand (2); Right Temporal Scalp  Destruction of lesion - Left Dorsal Hand, Right Dorsal Hand, Right Forearm - Posterior, Right Temporal Scalp Complexity: simple   Destruction method: cryotherapy   Informed consent: discussed and consent obtained   Lesion destroyed using liquid nitrogen: Yes   Cryotherapy cycles:  5 Outcome: patient tolerated procedure well with no complications    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  Skin /  nail biopsy Type of biopsy: tangential   Informed consent: discussed and consent obtained   Timeout: patient name, date of birth, surgical site, and procedure verified   Anesthesia: the lesion was anesthetized in a standard fashion   Anesthetic:  1% lidocaine w/ epinephrine 1-100,000 local infiltration Instrument used: flexible razor blade   Hemostasis achieved with: aluminum chloride and electrodesiccation   Outcome: patient tolerated procedure well   Post-procedure details: wound care instructions given    Destruction of lesion Complexity: simple   Destruction method: electrodesiccation and curettage   Informed consent: discussed and consent obtained   Timeout:  patient name, date of birth, surgical site, and procedure verified Anesthesia: the lesion was anesthetized in a standard fashion   Anesthetic:  1% lidocaine w/ epinephrine 1-100,000 local infiltration Curettage performed in three different directions: Yes   Electrodesiccation performed over the curetted area: Yes   Curettage cycles:  3 Lesion length (cm):  1.1 Lesion width (cm):  1.1 Margin per side (cm):  0 Final wound size (cm):  1.1 Hemostasis achieved with:  aluminum chloride Outcome: patient tolerated procedure well with no complications   Post-procedure details: wound care instructions given    Specimen 1 - Surgical pathology Differential Diagnosis: bcc vs scc txpbx  Check Margins: No  After shave biopsy the base of the lesion was treated with curettage plus cautery      I, [name], MD, have reviewed all documentation for this visit.  The documentation on 11/17/21 for the exam, diagnosis, procedures, and orders are  all accurate and complete.

## 2021-11-28 ENCOUNTER — Encounter: Payer: Self-pay | Admitting: Dermatology

## 2021-11-29 ENCOUNTER — Ambulatory Visit (INDEPENDENT_AMBULATORY_CARE_PROVIDER_SITE_OTHER): Payer: Medicare Other | Admitting: Dermatology

## 2021-11-29 ENCOUNTER — Other Ambulatory Visit: Payer: Self-pay

## 2021-11-29 ENCOUNTER — Encounter: Payer: Self-pay | Admitting: Dermatology

## 2021-11-29 DIAGNOSIS — Z85828 Personal history of other malignant neoplasm of skin: Secondary | ICD-10-CM

## 2021-11-29 DIAGNOSIS — B369 Superficial mycosis, unspecified: Secondary | ICD-10-CM | POA: Diagnosis not present

## 2021-11-29 DIAGNOSIS — L900 Lichen sclerosus et atrophicus: Secondary | ICD-10-CM | POA: Diagnosis not present

## 2021-11-29 DIAGNOSIS — L821 Other seborrheic keratosis: Secondary | ICD-10-CM

## 2021-11-29 LAB — POCT SKIN KOH

## 2021-12-10 ENCOUNTER — Encounter: Payer: Self-pay | Admitting: Dermatology

## 2021-12-10 NOTE — Progress Notes (Signed)
? ?  Follow-Up Visit ?  ?Subjective  ?Micheal [name] is a 76 y.o. male who presents for the following: Follow-up (Lesion under the left arm x months recheck it Dr T seen it at the last visit, right groin bruise like area x days. Personal history of bcc and scc ). ? ?Recheck spots groin under left arm ?Location:  ?Duration:  ?Quality:  ?Associated Signs/Symptoms: ?Modifying Factors:  ?Severity:  ?Timing: ?Context:  ? ?Objective  ?Well appearing patient in no apparent distress; mood and affect are within normal limits. ?Pubic ?Left groin 1 cm ovoid verrucous flattopped papule, typical dermoscopy. ? ?Left Axilla, Pubic ?Under left arm and left groin: Slightly atrophic 1+ centimeter circinate spots.  No margination.  No symptoms. ? ? ? ?A focused examination was performed including upper legs check.. Relevant physical exam findings are noted in the Assessment and Plan. ? ? ?Assessment & Plan  ? ? ?Seborrheic keratosis ?Pubic ? ?Safe to leave if stable  ? ?Lichen sclerosus (2) ?Pubic; Left Axilla ? ?Discussed the option of obtaining confirmatory biopsy; with upcoming move, this was deferred.  He may use 1% hydrocortisone if there are any symptoms. ? 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 ?Patient aware 4 weeks for the fungus results ? ?POCT Skin KOH - Left Genitocrural Fold ? ?Culture, fungus without smear - Left Genitocrural Fold ? ? ? ? ? ?I, [name], MD, have reviewed all documentation for this visit.  The documentation on 12/10/21 for the exam, diagnosis, procedures, and orders are all accurate and complete. ?

## 2021-12-13 ENCOUNTER — Telehealth: Payer: Self-pay | Admitting: Family Medicine

## 2021-12-13 MED ORDER — CHLORTHALIDONE 25 MG PO TABS: ORAL_TABLET | ORAL | 5 refills | Status: AC

## 2021-12-13 MED ORDER — OLMESARTAN MEDOXOMIL 40 MG PO TABS: 40.0000 mg | ORAL_TABLET | Freq: Every day | ORAL | 5 refills | Status: AC

## 2021-12-13 NOTE — Telephone Encounter (Signed)
Pt is calling and needs rxs to go to mail order pharm olmesartan (BENICAR) 40 MG tablet and chlorthalidone (HYGROTON) 25 MG tablet #90  ?[facility] ([place], [address] [place] Phone:  [phone]  ?Fax:  [phone]  ?  ? ?

## 2021-12-13 NOTE — Telephone Encounter (Signed)
RX sent

## 2021-12-20 ENCOUNTER — Telehealth: Payer: Self-pay | Admitting: Physical Medicine and Rehabilitation

## 2021-12-20 NOTE — Telephone Encounter (Signed)
Pt called requesting a call back to set an appt for lower back pains. Pease call pt at [phone]. ?

## 2021-12-27 ENCOUNTER — Ambulatory Visit: Payer: Medicare Other | Admitting: Physical Medicine and Rehabilitation

## 2021-12-29 LAB — CULTURE, FUNGUS WITHOUT SMEAR
CULTURE:: NO GROWTH
MICRO NUMBER:: 13134246
SPECIMEN QUALITY:: ADEQUATE

## 2022-01-05 DIAGNOSIS — Z20822 Contact with and (suspected) exposure to covid-19: Secondary | ICD-10-CM | POA: Diagnosis not present

## 2022-01-19 DIAGNOSIS — Z23 Encounter for immunization: Secondary | ICD-10-CM | POA: Diagnosis not present

## 2022-01-21 ENCOUNTER — Encounter: Payer: Self-pay | Admitting: Family Medicine

## 2022-05-12 ENCOUNTER — Other Ambulatory Visit: Payer: Self-pay | Admitting: Family Medicine

## 2022-07-30 ENCOUNTER — Telehealth: Payer: Self-pay | Admitting: Family Medicine

## 2022-07-30 NOTE — Telephone Encounter (Signed)
I spoke with spouse to schedule her AWV.    She stated she and patient moved to [facility]
